# Patient Record
Sex: Female | Born: 1975 | Race: Black or African American | Hispanic: No | Marital: Single | Smoking: Current every day smoker
Health system: Southern US, Community
[De-identification: ages and names within clinical notes are randomized; demographics above are authoritative.]

## PROBLEM LIST (undated history)

## (undated) DIAGNOSIS — M1220 Villonodular synovitis (pigmented), unspecified site: Secondary | ICD-10-CM

## (undated) DIAGNOSIS — C801 Malignant (primary) neoplasm, unspecified: Secondary | ICD-10-CM

## (undated) DIAGNOSIS — D869 Sarcoidosis, unspecified: Secondary | ICD-10-CM

## (undated) DIAGNOSIS — I10 Essential (primary) hypertension: Secondary | ICD-10-CM

## (undated) DIAGNOSIS — D693 Immune thrombocytopenic purpura: Secondary | ICD-10-CM

## (undated) DIAGNOSIS — K5090 Crohn's disease, unspecified, without complications: Secondary | ICD-10-CM

## (undated) DIAGNOSIS — R233 Spontaneous ecchymoses: Secondary | ICD-10-CM

## (undated) DIAGNOSIS — D649 Anemia, unspecified: Secondary | ICD-10-CM

## (undated) DIAGNOSIS — R238 Other skin changes: Secondary | ICD-10-CM

## (undated) DIAGNOSIS — R634 Abnormal weight loss: Secondary | ICD-10-CM

## (undated) DIAGNOSIS — Z413 Encounter for ear piercing: Secondary | ICD-10-CM

## (undated) DIAGNOSIS — M539 Dorsopathy, unspecified: Secondary | ICD-10-CM

## (undated) DIAGNOSIS — F329 Major depressive disorder, single episode, unspecified: Secondary | ICD-10-CM

## (undated) DIAGNOSIS — R0602 Shortness of breath: Secondary | ICD-10-CM

## (undated) DIAGNOSIS — E639 Nutritional deficiency, unspecified: Secondary | ICD-10-CM

## (undated) DIAGNOSIS — I639 Cerebral infarction, unspecified: Secondary | ICD-10-CM

## (undated) DIAGNOSIS — I8290 Acute embolism and thrombosis of unspecified vein: Secondary | ICD-10-CM

## (undated) DIAGNOSIS — R002 Palpitations: Secondary | ICD-10-CM

## (undated) DIAGNOSIS — Z973 Presence of spectacles and contact lenses: Secondary | ICD-10-CM

## (undated) DIAGNOSIS — L259 Unspecified contact dermatitis, unspecified cause: Secondary | ICD-10-CM

## (undated) DIAGNOSIS — N289 Disorder of kidney and ureter, unspecified: Secondary | ICD-10-CM

## (undated) DIAGNOSIS — F32A Depression, unspecified: Secondary | ICD-10-CM

## (undated) DIAGNOSIS — R059 Cough, unspecified: Secondary | ICD-10-CM

## (undated) DIAGNOSIS — R6889 Other general symptoms and signs: Secondary | ICD-10-CM

## (undated) DIAGNOSIS — L818 Other specified disorders of pigmentation: Secondary | ICD-10-CM

## (undated) DIAGNOSIS — R011 Cardiac murmur, unspecified: Secondary | ICD-10-CM

## (undated) DIAGNOSIS — G629 Polyneuropathy, unspecified: Secondary | ICD-10-CM

## (undated) DIAGNOSIS — G43909 Migraine, unspecified, not intractable, without status migrainosus: Secondary | ICD-10-CM

## (undated) DIAGNOSIS — J45909 Unspecified asthma, uncomplicated: Secondary | ICD-10-CM

## (undated) DIAGNOSIS — R05 Cough: Secondary | ICD-10-CM

## (undated) DIAGNOSIS — K219 Gastro-esophageal reflux disease without esophagitis: Secondary | ICD-10-CM

## (undated) DIAGNOSIS — Z9289 Personal history of other medical treatment: Secondary | ICD-10-CM

## (undated) DIAGNOSIS — I499 Cardiac arrhythmia, unspecified: Secondary | ICD-10-CM

## (undated) HISTORY — DX: Villonodular synovitis (pigmented), unspecified site: M12.20

## (undated) HISTORY — PX: OOPHORECTOMY: SHX86

## (undated) HISTORY — PX: OTHER SURGICAL HISTORY: SHX169

## (undated) HISTORY — PX: HX SMALL BOWEL RESECTION: SHX150

## (undated) HISTORY — PX: HX SUBCLAVIAN PORT IMPLANTION: 2100001166

## (undated) HISTORY — PX: HX COLONOSCOPY: 2100001147

## (undated) HISTORY — PX: HX FACIAL RECONSTRUCTION: 2100001155

## (undated) HISTORY — PX: HX OVARIAN CYST REMOVAL: SHX89

---
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
## 1997-09-17 ENCOUNTER — Encounter: Admission: RE | Admit: 1997-09-17 | Discharge: 1997-09-17 | Payer: Self-pay | Admitting: Internal Medicine

## 1997-09-18 ENCOUNTER — Encounter: Admission: RE | Admit: 1997-09-18 | Discharge: 1997-09-18 | Payer: Self-pay | Admitting: Internal Medicine

## 1997-11-05 ENCOUNTER — Emergency Department (HOSPITAL_COMMUNITY): Admission: EM | Admit: 1997-11-05 | Discharge: 1997-11-05 | Payer: Self-pay | Admitting: Emergency Medicine

## 1997-12-02 ENCOUNTER — Encounter: Admission: RE | Admit: 1997-12-02 | Discharge: 1997-12-02 | Payer: Self-pay | Admitting: Internal Medicine

## 1999-06-15 DIAGNOSIS — O2230 Deep phlebothrombosis in pregnancy, unspecified trimester: Secondary | ICD-10-CM

## 1999-06-15 DIAGNOSIS — I2699 Other pulmonary embolism without acute cor pulmonale: Secondary | ICD-10-CM

## 1999-06-15 HISTORY — DX: Deep phlebothrombosis in pregnancy, unspecified trimester: O22.30

## 1999-06-15 HISTORY — DX: Other pulmonary embolism without acute cor pulmonale (CMS HCC): I26.99

## 2000-08-11 ENCOUNTER — Emergency Department (HOSPITAL_COMMUNITY): Admission: EM | Admit: 2000-08-11 | Discharge: 2000-08-11 | Payer: Self-pay | Admitting: Emergency Medicine

## 2000-10-20 ENCOUNTER — Inpatient Hospital Stay (HOSPITAL_COMMUNITY): Admission: AD | Admit: 2000-10-20 | Discharge: 2000-10-20 | Payer: Self-pay | Admitting: *Deleted

## 2000-12-14 ENCOUNTER — Emergency Department (HOSPITAL_COMMUNITY): Admission: EM | Admit: 2000-12-14 | Discharge: 2000-12-14 | Payer: Self-pay | Admitting: *Deleted

## 2001-10-05 ENCOUNTER — Inpatient Hospital Stay (HOSPITAL_COMMUNITY): Admission: AD | Admit: 2001-10-05 | Discharge: 2001-10-05 | Payer: Self-pay | Admitting: *Deleted

## 2001-10-06 ENCOUNTER — Inpatient Hospital Stay (HOSPITAL_COMMUNITY): Admission: AD | Admit: 2001-10-06 | Discharge: 2001-10-06 | Payer: Self-pay | Admitting: *Deleted

## 2001-10-06 ENCOUNTER — Encounter: Payer: Self-pay | Admitting: *Deleted

## 2001-10-12 ENCOUNTER — Encounter: Admission: RE | Admit: 2001-10-12 | Discharge: 2001-10-12 | Payer: Self-pay | Admitting: Obstetrics and Gynecology

## 2001-11-15 ENCOUNTER — Other Ambulatory Visit (HOSPITAL_COMMUNITY): Payer: Self-pay

## 2002-09-04 ENCOUNTER — Inpatient Hospital Stay (HOSPITAL_COMMUNITY): Admission: AD | Admit: 2002-09-04 | Discharge: 2002-09-04 | Payer: Self-pay | Admitting: *Deleted

## 2002-09-04 ENCOUNTER — Encounter: Payer: Self-pay | Admitting: *Deleted

## 2012-06-14 HISTORY — PX: GASTRECTOMY: SHX58

## 2012-07-09 ENCOUNTER — Inpatient Hospital Stay
Admission: EM | Admit: 2012-07-09 | Discharge: 2012-08-01 | DRG: 326 | Disposition: A | Discharge status: Home / Self Care | Payer: MEDICAID | Source: Other Acute Inpatient Hospital | Attending: SURGICAL ONCOLOGY | Admitting: SURGICAL ONCOLOGY

## 2012-07-09 ENCOUNTER — Inpatient Hospital Stay (HOSPITAL_COMMUNITY): Payer: MEDICAID | Admitting: Surgical Oncology

## 2012-07-09 DIAGNOSIS — G894 Chronic pain syndrome: Secondary | ICD-10-CM | POA: Diagnosis present

## 2012-07-09 HISTORY — DX: Palpitations: R00.2

## 2012-07-09 HISTORY — DX: Nutritional deficiency, unspecified: E63.9

## 2012-07-09 HISTORY — DX: Cough, unspecified: R05.9

## 2012-07-09 HISTORY — DX: Shortness of breath: R06.02

## 2012-07-09 HISTORY — DX: Anemia, unspecified: D64.9

## 2012-07-09 HISTORY — DX: Cerebral infarction, unspecified (CMS HCC): I63.9

## 2012-07-09 HISTORY — DX: Abnormal weight loss: R63.4

## 2012-07-09 LAB — BASIC METABOLIC PANEL
ANION GAP: 6 mmol/L (ref 5–16)
BUN/CREAT RATIO: 10 (ref 6–22)
BUN: 8 mg/dL (ref 6–20)
CALCIUM: 9.3 mg/dL (ref 8.5–10.4)
CARBON DIOXIDE: 27 mmol/L (ref 22–32)
CHLORIDE: 100 mmol/L (ref 96–111)
CREATININE: 0.81 mg/dL (ref 0.49–1.10)
ESTIMATED GLOMERULAR FILTRATION RATE: >59 ml/min/1.73m2 (ref >59)
GLUCOSE,NONFAST: 87 mg/dL (ref 65–139)
POTASSIUM: 4.4 mmol/L (ref 3.5–5.1)
SODIUM: 133 mmol/L — ABNORMAL LOW (ref 136–145)

## 2012-07-09 LAB — CBC/DIFF
BASOPHILS: 0 %
BASOS ABS: 0 10*3/uL (ref 0.0–0.2)
EOS ABS: 0.648 10*3/uL — ABNORMAL HIGH (ref 0.0–0.5)
EOSINOPHIL: 6 %
HCT: 30.4 % — ABNORMAL LOW (ref 33.5–45.2)
HGB: 9.6 g/dL — ABNORMAL LOW (ref 11.2–15.2)
LYMPHOCYTES: 11 %
LYMPHS ABS: 1.188 THOU/uL (ref 1.0–4.8)
MCH: 27.3 pg — ABNORMAL LOW (ref 27.4–33.0)
MCHC: 31.7 g/dL — ABNORMAL LOW (ref 32.5–35.8)
MCV: 86.2 fL (ref 78–100)
MONOCYTES: 13 %
MONOS ABS: 1.404 10*3/uL — ABNORMAL HIGH (ref 0.3–1.0)
PLATELET COUNT: UNDETERMINED THOU/uL (ref 140–450)
PMN ABS: 7.56 10*3/uL (ref 1.5–7.7)
PMN'S: 70 %
RBC: 3.53 MIL/uL — ABNORMAL LOW (ref 3.63–4.92)
RDW: 15.7 % — ABNORMAL HIGH (ref 12.0–15.0)
WBC: 9.3 10*3/uL (ref 3.5–11.0)

## 2012-07-09 LAB — HEPATIC FUNCTION PANEL
ALBUMIN: 2.9 g/dL — ABNORMAL LOW (ref 3.5–4.8)
ALKALINE PHOSPHATASE: 75 U/L (ref 38–126)
ALT (SGPT): 6 U/L — ABNORMAL LOW (ref 7–45)
AST (SGOT): 20 U/L (ref 8–41)
BILIRUBIN, TOTAL: 0.5 mg/dL (ref 0.3–1.3)
BILIRUBIN,CONJUGATED: <0.2 mg/dl (ref 0.0–0.3)
TOTAL PROTEIN: 5.7 g/dL — ABNORMAL LOW (ref 6.4–8.3)

## 2012-07-09 LAB — PHOSPHORUS: PHOSPHORUS: 5.4 mg/dL — ABNORMAL HIGH (ref 2.4–4.7)

## 2012-07-09 LAB — MAGNESIUM: MAGNESIUM: 1.7 mg/dL (ref 1.7–2.5)

## 2012-07-09 MED ORDER — ESOMEPRAZOLE SODIUM 40 MG INTRAVENOUS SOLUTION
40.0000 mg | Freq: Every day | INTRAVENOUS | Status: DC
Start: 2012-07-09 — End: 2012-07-25
  Administered 2012-07-09 – 2012-07-24 (×13): 40 mg via INTRAVENOUS
  Filled 2012-07-09 (×17): qty 5

## 2012-07-09 MED ORDER — DOCUSATE SODIUM 100 MG CAPSULE
100.0000 mg | ORAL_CAPSULE | Freq: Two times a day (BID) | ORAL | Status: DC
Start: 2012-07-09 — End: 2012-07-19
  Filled 2012-07-09 (×21): qty 1

## 2012-07-09 MED ORDER — LACTATED RINGERS INTRAVENOUS SOLUTION
INTRAVENOUS | Status: DC
Start: 2012-07-09 — End: 2012-07-18

## 2012-07-09 MED ORDER — ONDANSETRON HCL (PF) 4 MG/2 ML INJECTION SOLUTION
4.00 mg | Freq: Three times a day (TID) | INTRAMUSCULAR | Status: DC | PRN
Start: 2012-07-09 — End: 2012-07-17
  Administered 2012-07-09 – 2012-07-17 (×19): 4 mg via INTRAVENOUS
  Filled 2012-07-09 (×20): qty 2

## 2012-07-09 MED ORDER — DIPHENHYDRAMINE 25 MG CAPSULE
50.0000 mg | ORAL_CAPSULE | ORAL | Status: AC
Start: 2012-07-09 — End: 2012-07-09
  Administered 2012-07-09: 50 mg via ORAL
  Filled 2012-07-09: qty 2

## 2012-07-09 MED ORDER — LORAZEPAM 1 MG TABLET
1.0000 mg | ORAL_TABLET | ORAL | Status: DC | PRN
Start: 2012-07-09 — End: 2012-07-12
  Administered 2012-07-09 – 2012-07-12 (×6): 1 mg via ORAL
  Filled 2012-07-09 (×6): qty 1

## 2012-07-09 MED ORDER — PROMETHAZINE 25 MG/ML INJECTION SOLUTION
25.0000 mg | Freq: Four times a day (QID) | INTRAMUSCULAR | Status: DC | PRN
Start: 2012-07-09 — End: 2012-07-25
  Administered 2012-07-09 – 2012-07-22 (×10): 25 mg via INTRAMUSCULAR
  Filled 2012-07-09 (×28): qty 1

## 2012-07-09 MED ORDER — HYDROMORPHONE 1 MG/ML INJECTION WRAPPER
0.2000 mg | INJECTION | INTRAMUSCULAR | Status: DC | PRN
Start: 2012-07-09 — End: 2012-07-10
  Filled 2012-07-09 (×4): qty 1

## 2012-07-09 MED ADMIN — docusate sodium 100 mg capsule: 0 mg | ORAL

## 2012-07-09 MED ADMIN — HYDROmorphone (PF) 1 mg/mL injection solution: 0.2 mg | INTRAVENOUS | NDC 00409255201

## 2012-07-09 NOTE — Nurses Notes (Signed)
 Patient admitted as a transfer from [facility] via ambulance with gastric mass. Dr. Bonasso with Surg Onc paged and currently at bedside seeing patient. Awaiting placement of orders at this time. Will monitor.

## 2012-07-09 NOTE — Nurses Notes (Signed)
 Lab called to inform RN that patient's platelets had clumped, therefore platelet count could not be resulted unless new CBC drawn. Paged Dr. Bonasso. Stated not to redraw CBC at this time, would check with CBC ordered for tomorrow. Also spoke with Dr. Bonasso regarding patient's NPO status. Patient states she is very hungry and was eating this morning at outside facility. Dr. Bonasso stated they would be at bedside soon to evaluate patient and to keep patient NPO until further notice. No further orders obtained. Will monitor.

## 2012-07-09 NOTE — H&P (Addendum)
[facility]  Surgery  Admission H&P    Bly, 37 y.o. female  Date of Birth:  [birth year]  Date of Admission:  07/09/2012    Information Obtained from: patient  Chief Complaint: abdominal pain.     PCP: No primary provider on file.    Michele Mason is a 37 y.o., Black/African American female with past medical history of chronic pain, sarcoidosis, and question of Crohn's disease who presented to [facility] on 07/04/12.  Patient was previous seen by Dr. Concha Se in [place] when she underwent a colonoscopy and EGD.  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  Patient states that over the past two months she has had a sharp stabbing pain in her abdomen each time she eats.  The pain has been very unbearable causing her to have a twenty pound weight loss over that time period.  She also complains of nausea and vomiting .  At outside facility, CT scan was performed without evidence of stomach mass. Patient was transferred to [facility] for further care under Dr. Theodoro Grist.     For symptom treatment of pain and nausea, patient has been taken zantac and Nexium.  She states that she had two small bowel obstructions in 2007 and 2008 requiring surgeries and was diagnosed with Crohn's disease since that time.  She has not taken any medications for Crohn's treatment. 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     ROS:  MUST comment on all "Abnormal" findings   ROS Other than ROS in the HPI, all other systems were negative.    PAST MEDICAL/ FAMILY/ SOCIAL HISTORY:     No past medical history on file.  Allergies not on file  Medications Prior to Admission    None         No past surgical history on file.  No family history on file.  History    Substance Use Topics   . Smoking status: Not on file   . Smokeless tobacco: Not on file   . Alcohol Use: Not on file     PHYSICAL EXAMINATION: MUST comment on all "Abnormal" findings    Exam    General: appears in good health  Eyes: Conjunctiva clear.  HENT:Head atraumatic and normocephalic  Neck: No JVD or thyromegaly or lymphadenopathy  Lungs: Clear to auscultation bilaterally.   Cardiovascular: regular rate and rhythm  Abdomen: midline abdominal scar c/d/i. tenderness to palpation in mid epigastrum.  Extremities: No cyanosis or edema  Skin: Skin warm and dry  Neurologic: Grossly normal    Labs Ordered/ Reviewed (Please indicate ordered or reviewed)   Reviewed: Labs:  Lab Results for Last 24 Hours:    Results for orders placed during the hospital encounter of 07/09/12 (from the past 24 hour(s))   CBC/DIFF       Result Value Range    WBC 9.3  3.5 - 11.0 THOU/uL    RBC 3.53 (*) 3.63 - 4.92 MIL/uL    HGB 9.6 (*) 11.2 - 15.2 g/dL    HCT 16.1 (*) 09.6 - 45.2 %    MCV 86.2  78 - 100 fL    MCH 27.3 (*) 27.4 - 33.0 pg    MCHC 31.7 (*) 32.5 - 35.8 g/dL  RDW 15.7 (*) 12.0 - 15.0 %    PLATELET COUNT PLATELETS CLUMPED ON SLIDE, UNABLE TO DO COUNT  140 - 450 THOU/uL    MPV NOT REPORTED  7.5 - 11.5 fL    PMN'S 70      PMN ABS 7.560  1.5 - 7.7 THOU/uL    LYMPHOCYTES 11      LYMPHS ABS 1.188  1.0 - 4.8 THOU/uL    MONOCYTES 13      MONOS ABS 1.404 (*) 0.3 - 1.0 THOU/uL    EOSINOPHIL 6      EOS ABS 0.648 (*) 0.0 - 0.5 THOU/uL    BASOPHILS 0      BASOS ABS 0.000  0.0 - 0.2 THOU/uL    HYPOCHROMASIA MODERATE      ANISOCYTOSIS SLIGHT      MICROCYTOSIS SLIGHT      PLATELET CLUMPS PRESENT     BASIC METABOLIC PANEL, NON-FASTING       Result Value Range    SODIUM 133 (*) 136 - 145 mmol/L    POTASSIUM 4.4  3.5 - 5.1 mmol/L    CHLORIDE 100  96 - 111 mmol/L    CARBON DIOXIDE 27  22 - 32 mmol/L    ANION GAP 6  5 - 16 mmol/L    CREATININE 0.81  0.49 - 1.10 mg/dL    ESTIMATED GLOMERULAR FILTRATION RATE >59  >59 ml/min/1.31m2     GLUCOSE,NONFAST 87  65 - 139 mg/dL    BUN 8  6 - 20 mg/dL    BUN/CREAT RATIO 10  6 - 22    CALCIUM 9.3  8.5 - 10.4 mg/dL   MAGNESIUM       Result Value Range    MAGNESIUM 1.7  1.7 - 2.5 mg/dL   PHOSPHORUS       Result Value Range    PHOSPHORUS 5.4 (*) 2.4 - 4.7 mg/dL   HEPATIC FUNCTION PANEL       Result Value Range    ALBUMIN 2.9 (*) 3.5 - 4.8 g/dL    BILIRUBIN, TOTAL 0.5  0.3 - 1.3 mg/dL    ALKALINE PHOSPHATASE 75  38 - 126 U/L    AST (SGOT) 20  8 - 41 U/L    ALT (SGPT) 6 (*) 7 - 45 U/L    BILIRUBIN,CONJUGATED <0.2  0.0 - 0.3 mg/dl    TOTAL PROTEIN 5.7 (*) 6.4 - 8.3 g/dL     Radiology Tests Ordered/ Reviewed (Please indicate ordered or reviewed)   Reviewed:  CT abdomen and pelvis:   No evidence of an abdominal or pelvic mass or abscess is identified. No evidence of a small bowel obstruction is seen. A 5 mm in diameter nodule seen in the right lower lobe.     CT scan reviewed with concern with circumferential mass around pylorus and antrum of stomach.     ASSESSMENT & PLAN:    37 year old female with antral mass reportedly adenocarcinoma per outside report.     Will consult GI for EGD biopsy and EUS.   -spoke with GI resident who stated he will not see patient until tomorrow with Dr. Boyd Kerbs  Full liquid diet.  Nutritional supplements.    Dierdre Forth II, MD, 07/09/2012 2:19 PM    A late entry for note on 07/09/2012.      I saw and examined the patient.  I reviewed the resident's note.  I agree with the findings and plan of care as documented  in the resident's note.  Any exceptions/additions are edited/noted.    Lester Carolina, MD 07/10/2012, 1:09 PM

## 2012-07-09 NOTE — Nurses Notes (Signed)
Pt stated Dr. Lance Bosch would place an order for something to help her sleep tonight. No orders placed. Paged Lance Bosch. He said he would place an order for benadryl 50 mg one time dose. Will continue to monitor.

## 2012-07-09 NOTE — Nurses Notes (Signed)
Pt and pt's father asking questions regarding admission and dx. Paged Dr. Lance Bosch and asked if he could come and talk to the pt. He said he would visit.

## 2012-07-10 ENCOUNTER — Encounter (HOSPITAL_COMMUNITY): Payer: Self-pay | Admitting: SURGICAL ONCOLOGY

## 2012-07-10 DIAGNOSIS — E440 Moderate protein-calorie malnutrition: Secondary | ICD-10-CM

## 2012-07-10 DIAGNOSIS — G894 Chronic pain syndrome: Secondary | ICD-10-CM

## 2012-07-10 DIAGNOSIS — D869 Sarcoidosis, unspecified: Secondary | ICD-10-CM

## 2012-07-10 DIAGNOSIS — C169 Malignant neoplasm of stomach, unspecified: Secondary | ICD-10-CM

## 2012-07-10 HISTORY — DX: Sarcoidosis, unspecified: D86.9

## 2012-07-10 HISTORY — DX: Moderate protein-calorie malnutrition (CMS HCC): E44.0

## 2012-07-10 HISTORY — DX: Chronic pain syndrome: G89.4

## 2012-07-10 HISTORY — DX: Malignant neoplasm of stomach, unspecified (CMS HCC): C16.9

## 2012-07-10 LAB — CBC
HCT: 30.7 % — ABNORMAL LOW (ref 33.5–45.2)
HGB: 9.9 g/dL — ABNORMAL LOW (ref 11.2–15.2)
MCH: 27.3 pg — ABNORMAL LOW (ref 27.4–33.0)
MCHC: 32.1 g/dL — ABNORMAL LOW (ref 32.5–35.8)
MCV: 85 fL (ref 78–100)
MPV: 9 fL (ref 7.5–11.5)
PLATELET COUNT: 167 THOU/uL (ref 140–450)
RBC: 3.61 MIL/uL — ABNORMAL LOW (ref 3.63–4.92)
RDW: 15.8 % — ABNORMAL HIGH (ref 12.0–15.0)
WBC: 8.4 10*3/uL (ref 3.5–11.0)

## 2012-07-10 LAB — BASIC METABOLIC PANEL
ANION GAP: 6 mmol/L (ref 5–16)
BUN/CREAT RATIO: 8 (ref 6–22)
BUN: 7 mg/dL (ref 6–20)
CALCIUM: 9.3 mg/dL (ref 8.5–10.4)
CARBON DIOXIDE: 27 mmol/L (ref 22–32)
CHLORIDE: 103 mmol/L (ref 96–111)
CREATININE: 0.84 mg/dL (ref 0.49–1.10)
ESTIMATED GLOMERULAR FILTRATION RATE: >59 ml/min/1.73m2 (ref >59)
GLUCOSE,NONFAST: 94 mg/dL (ref 65–139)
POTASSIUM: 4.9 mmol/L (ref 3.5–5.1)
SODIUM: 136 mmol/L (ref 136–145)

## 2012-07-10 LAB — CARCINOEMBRYONIC ANTIGEN: CARCINOEMBRYONIC AG: 1.4 ng/mL (ref <3.1)

## 2012-07-10 LAB — PT/INR
INR: 1 (ref 0.8–1.2)
PROTHROMBIN TIME: 10.2 s (ref 9.1–12.5)

## 2012-07-10 LAB — PREALBUMIN: PREALBUMIN: 11.3 mg/dL — ABNORMAL LOW (ref 18–40)

## 2012-07-10 MED ORDER — HYDROCODONE 7.5 MG-ACETAMINOPHEN 325 MG/15 ML ORAL SOLUTION
10.0000 mL | ORAL | Status: DC | PRN
Start: 2012-07-10 — End: 2012-07-13
  Administered 2012-07-10 – 2012-07-13 (×8): 10 mL via ORAL
  Filled 2012-07-10 (×8): qty 15

## 2012-07-10 MED ORDER — HYDROMORPHONE 1 MG/ML INJECTION WRAPPER
0.6000 mg | INJECTION | Freq: Once | INTRAMUSCULAR | Status: AC
Start: 2012-07-10 — End: 2012-07-10
  Administered 2012-07-10: 0.6 mg via INTRAVENOUS
  Filled 2012-07-10: qty 1

## 2012-07-10 MED ORDER — CALCIUM GLUCONATE 100 MG/ML (10 %) INTRAVENOUS SOLUTION
INTRAVENOUS | Status: AC
Start: 2012-07-10 — End: 2012-07-11
  Filled 2012-07-10: qty 600

## 2012-07-10 MED ADMIN — docusate sodium 100 mg capsule: 100 mg | ORAL | NDC 00904788980

## 2012-07-10 MED ADMIN — HYDROmorphone (PF) 1 mg/mL injection solution: 0.2 mg | INTRAVENOUS | NDC 00409255201

## 2012-07-10 MED ADMIN — HYDROmorphone (PF) 1 mg/mL injection solution: 0.4 mg | INTRAVENOUS | NDC 00409255201

## 2012-07-10 MED ADMIN — HYDROmorphone (PF) 1 mg/mL injection solution: 0 mg | INTRAVENOUS

## 2012-07-10 MED FILL — HYDROmorphone 1 mg/mL injection syringe: 0.4000 mg | INTRAMUSCULAR | Qty: 1 | Status: AC

## 2012-07-10 NOTE — Care Plan (Signed)
 Problem: General Plan of Care(Adult,OB)  Goal: Plan of Care Review(Adult,OB)  The patient and/or their representative will communicate an understanding of their plan of care   Outcome: Ongoing (see interventions/notes)  Discharge Plan:  Home(Patient/Family Member/other) (code 1)  Patient admitted for Gastric Mass.  Denies any wants or needs at this time.  Patient is comfortable and safe upon discharge.  Will cont to await disposition.    The patient will continue to be evaluated for developing discharge needs.

## 2012-07-10 NOTE — Care Management Notes (Signed)
[facility] Management Initial Evaluation    Patient Name: Michele Mason  Date of Birth: 1976-05-22  Sex: female  Date/Time of Admission: 07/09/2012  1:37 PM  Room/Bed: 781/A  Payor: [place] MEDICAID  Plan: [facility] MEDICAID  Product Type: Medicaid    PCP: No primary provider on file.    Pharmacy Info:   Preferred Pharmacy    None        Emergency Contact Info:   Extended Emergency Contact Information  Primary Emergency Contact: HARUMI YAMIN   [place]  Home Phone: [phone]  Relation: None    History:   Michele Mason is a 37 y.o., female, admitted gastric mass    Height/Weight: 154.9 cm (5\' 1" ) / 52.8 kg (116 lb 6.5 oz)    Subjective/Objective: Discharge Planning     LOS: 1 day   Admitting Diagnosis: gastric mass  Assessment:    07/10/12 1441   Assessment Details   Assessment Type Admission   Date of Care Management Update 07/10/12   Date of Next DCP Update 07/13/12   Care Management Plan   Discharge Planning Status initial meeting   Projected Discharge Date 07/10/12   Discharge Needs Assessment   Outpatient/Agency/Support Group Needs none   Equipment Currently Used At Home none   Discharge Facility/Level of Care Needs Home (Patient/Family Member/other)(code 1)   Transportation Available car;family or friend will provide   Referral Information   Admission Type inpatient   Address Verified verified-no changes   Arrived From home   Insurance Verified verified-no change   ADVANCE DIRECTIVES   !! Does the Patient have an Advance Directive? No, Information Offered and Refused   Employment/Financial   Patient has Prescription Coverage?  Yes       Name of Insurance Coverage for Medications [place]   Living Environment   Lives With alone   Living Arrangements apartment   Able to Return to Prior Living Arrangements yes   Home Safety   Home Assessment: Stairs in Home   Home Accessibility stairs to enter home   Living Environment   Number of Stairs to Enter Home 15        Discharge Plan:  Home(Patient/Family Member/other) (code 1)  Patient admitted for Gastric Mass.  Denies any wants or needs at this time.  Patient is comfortable and safe upon discharge.  Will cont to await disposition.    The patient will continue to be evaluated for developing discharge needs.     Case Manager: Monico Hoar, RN 07/10/2012, 2:43 PM  Phone: [phone]

## 2012-07-10 NOTE — Nurses Notes (Signed)
Patient complaining of Nausea, Zofran last administered by nurse at 0054, Patient refusing to take Phenergan and requesting Zofran. Zofran ordered Q8h and patient is not due at this time. Dr. Ezequiel Essex notified, telephone orders received that, "she can have a dose of Zofran now". Zofran administered to patient, will continue to monitor.

## 2012-07-11 ENCOUNTER — Encounter (HOSPITAL_COMMUNITY): Payer: MEDICAID | Admitting: Anesthesiology-BA

## 2012-07-11 ENCOUNTER — Encounter (HOSPITAL_COMMUNITY)
Admission: EM | Disposition: A | Discharge status: Home / Self Care | Payer: Self-pay | Source: Other Acute Inpatient Hospital | Attending: SURGICAL ONCOLOGY

## 2012-07-11 ENCOUNTER — Encounter (HOSPITAL_COMMUNITY): Payer: Self-pay | Admitting: Anesthesiology-BA

## 2012-07-11 ENCOUNTER — Encounter (HOSPITAL_COMMUNITY): Payer: Self-pay

## 2012-07-11 ENCOUNTER — Other Ambulatory Visit (HOSPITAL_COMMUNITY): Payer: Self-pay | Admitting: Gastroenterology-BA

## 2012-07-11 HISTORY — PX: ENDOSCOPIC ULTRASOUND UPPER: WVUENDOPRO37

## 2012-07-11 HISTORY — PX: GASTROSCOPY WITH BIOPSY: WVUENDOPRO54

## 2012-07-11 LAB — PHOSPHORUS: PHOSPHORUS: 4.8 mg/dL — ABNORMAL HIGH (ref 2.4–4.7)

## 2012-07-11 LAB — MAGNESIUM: MAGNESIUM: 1.7 mg/dL (ref 1.7–2.5)

## 2012-07-11 SURGERY — GASTROSCOPY
Anesthesia: General | Site: Mouth | Wound class: Clean Contaminated Wounds-The respiratory, GI, Genital, or urinary

## 2012-07-11 MED ORDER — MIDAZOLAM 1 MG/ML INJECTION SOLUTION
Freq: Once | INTRAMUSCULAR | Status: DC | PRN
Start: 2012-07-11 — End: 2012-07-11
  Administered 2012-07-11: 2 mg via INTRAVENOUS

## 2012-07-11 MED ORDER — GLYCOPYRROLATE 0.2 MG/ML INJECTION SOLUTION
Freq: Once | INTRAMUSCULAR | Status: DC | PRN
Start: 2012-07-11 — End: 2012-07-11
  Administered 2012-07-11: 0.4 mg via INTRAVENOUS

## 2012-07-11 MED ORDER — LACTATED RINGERS INTRAVENOUS SOLUTION
INTRAVENOUS | Status: DC
Start: 2012-07-11 — End: 2012-07-19

## 2012-07-11 MED ORDER — ACETAMINOPHEN 325 MG TABLET
650.0000 mg | ORAL_TABLET | ORAL | Status: DC | PRN
Start: 2012-07-11 — End: 2012-07-19
  Administered 2012-07-11 – 2012-07-18 (×12): 650 mg via ORAL
  Filled 2012-07-11 (×13): qty 2

## 2012-07-11 MED ORDER — HEPARIN LOCK FLUSH (PORCINE) 10 UNIT/ML INTRAVENOUS SOLUTION
2.0000 mL | INTRAVENOUS | Status: DC | PRN
Start: 2012-07-11 — End: 2012-07-27

## 2012-07-11 MED ORDER — PHENYLEPHRINE 120 MCG/ML IV DILUTION
Freq: Once | INTRAMUSCULAR | Status: DC | PRN
Start: 2012-07-11 — End: 2012-07-11
  Administered 2012-07-11: 120 ug via INTRAVENOUS

## 2012-07-11 MED ORDER — ONDANSETRON HCL (PF) 4 MG/2 ML INJECTION SOLUTION
Freq: Once | INTRAMUSCULAR | Status: DC | PRN
Start: 2012-07-11 — End: 2012-07-11
  Administered 2012-07-11: 4 mg via INTRAVENOUS

## 2012-07-11 MED ORDER — DEXAMETHASONE SODIUM PHOSPHATE 4 MG/ML INJECTION SOLUTION
Freq: Once | INTRAMUSCULAR | Status: DC | PRN
Start: 2012-07-11 — End: 2012-07-11
  Administered 2012-07-11: 4 mg via INTRAVENOUS

## 2012-07-11 MED ORDER — SODIUM CHLORIDE 0.9 % (FLUSH) INJECTION SYRINGE
20.0000 mL | INJECTION | INTRAMUSCULAR | Status: DC | PRN
Start: 2012-07-11 — End: 2012-07-27
  Filled 2012-07-11: qty 30

## 2012-07-11 MED ORDER — PROPOFOL 10 MG/ML IV BOLUS
INJECTION | Freq: Once | INTRAVENOUS | Status: DC | PRN
Start: 2012-07-11 — End: 2012-07-11
  Administered 2012-07-11: 100 mg via INTRAVENOUS
  Administered 2012-07-11: 10 mg via INTRAVENOUS

## 2012-07-11 MED ORDER — HYDROMORPHONE 1 MG/ML INJECTION WRAPPER
0.2000 mg | INJECTION | INTRAMUSCULAR | Status: DC | PRN
Start: 2012-07-11 — End: 2012-07-11
  Filled 2012-07-11: qty 1

## 2012-07-11 MED ORDER — FENTANYL (PF) 50 MCG/ML INJECTION SOLUTION
Freq: Once | INTRAMUSCULAR | Status: DC | PRN
Start: 2012-07-11 — End: 2012-07-11
  Administered 2012-07-11 (×2): 50 ug via INTRAVENOUS

## 2012-07-11 MED ORDER — SODIUM CHLORIDE 0.9 % (FLUSH) INJECTION SYRINGE
10.0000 mL | INJECTION | Freq: Three times a day (TID) | INTRAMUSCULAR | Status: DC
Start: 2012-07-11 — End: 2012-07-27

## 2012-07-11 MED ORDER — BENZOCAINE 7.5 % MUCOSAL GEL
Freq: Every day | Status: DC | PRN
Start: 2012-07-11 — End: 2012-07-25
  Filled 2012-07-11 (×2): qty 10

## 2012-07-11 MED ORDER — LIDOCAINE (PF) 10 MG/ML (1 %) INJECTION SOLUTION
0.5000 mL | Freq: Once | INTRAMUSCULAR | Status: AC
Start: 2012-07-11 — End: 2012-07-11

## 2012-07-11 MED ORDER — NEOSTIGMINE METHYLSULFATE 1 MG/ML INJECTION SOLUTION
Freq: Once | INTRAMUSCULAR | Status: DC | PRN
Start: 2012-07-11 — End: 2012-07-11
  Administered 2012-07-11: 3 mg via INTRAVENOUS

## 2012-07-11 MED ORDER — HYDROMORPHONE 50 MG/50 ML IN 0.9 % SODIUM CHLORIDE INJECTION
1.0000 | INTRAMUSCULAR | Status: DC | PRN
Start: 2012-07-11 — End: 2012-07-19
  Filled 2012-07-11: qty 1

## 2012-07-11 MED ORDER — METOPROLOL TARTRATE 5 MG/5 ML INTRAVENOUS SOLUTION
Freq: Once | INTRAVENOUS | Status: DC | PRN
Start: 2012-07-11 — End: 2012-07-11
  Administered 2012-07-11 (×2): 1 mg via INTRAVENOUS

## 2012-07-11 MED ORDER — HEPARIN LOCK FLUSH (PORCINE) 10 UNIT/ML INTRAVENOUS SOLUTION
2.0000 mL | Freq: Three times a day (TID) | INTRAVENOUS | Status: DC
Start: 2012-07-11 — End: 2012-07-27
  Filled 2012-07-11 (×4): qty 5

## 2012-07-11 MED ORDER — HYDROMORPHONE 1 MG/ML INJECTION WRAPPER
0.4000 mg | INJECTION | INTRAMUSCULAR | Status: DC | PRN
Start: 2012-07-11 — End: 2012-07-11
  Filled 2012-07-11: qty 1

## 2012-07-11 MED ORDER — LIDOCAINE (PF) 100 MG/5 ML (2 %) INTRAVENOUS SYRINGE
INJECTION | Freq: Once | INTRAVENOUS | Status: DC | PRN
Start: 2012-07-11 — End: 2012-07-11
  Administered 2012-07-11: 65 mg via INTRAVENOUS

## 2012-07-11 MED ADMIN — HYDROmorphone (PF) 1 mg/mL injection solution: 0.4 mg | INTRAVENOUS | NDC 00409255201

## 2012-07-11 MED ADMIN — HYDROmorphone (PF) 1 mg/mL injection solution: 0.6 mg | INTRAVENOUS | NDC 00409255201

## 2012-07-11 MED ADMIN — docusate sodium 100 mg capsule: 100 mg | ORAL | NDC 00904788980

## 2012-07-11 MED ADMIN — HYDROmorphone 50 mg/50 mL in 0.9 % sodium chloride injection: 1 | INTRAVENOUS | NDC 61553024296

## 2012-07-11 MED ADMIN — heparin lock flush (porcine) 10 unit/mL intravenous solution: 0

## 2012-07-11 MED ADMIN — sodium chloride 0.9 % (flush) injection syringe: 10 mL | NDC 08290306547

## 2012-07-11 MED ADMIN — heparin lock flush (porcine) 10 unit/mL intravenous solution: 5 mL | NDC 6425322235

## 2012-07-11 MED ADMIN — sodium chloride 0.9 % (flush) injection syringe: 20 mL | NDC 08290306547

## 2012-07-11 MED ADMIN — lidocaine (PF) 10 mg/mL (1 %) injection solution: 0.5 mL | INTRADERMAL | NDC 09999995798

## 2012-07-11 MED FILL — parenteral amino acid 10 % combination no.6 intravenous solution: INTRAVENOUS | Qty: 600 | Status: AC

## 2012-07-11 MED FILL — HYDROmorphone 1 mg/mL injection syringe: 0.6000 mg | INTRAMUSCULAR | Qty: 1 | Status: AC

## 2012-07-11 SURGICAL SUPPLY — 80 items
BASIN EME 16OZ 9X3.8X2IN GRAD_FLXB DISP DST ROSE POLYPROP (PATU)
BASIN EME 8.4X3.8X2IN GRAD DISP DST ROSE POLYPROP C500ML LF (PATU) IMPLANT
BLOCK BITE 20MM PE ADULT MOUTHPC STRAP RETENTION RIM LUM SCPSVR LF  LRG 27MM GRN NONST DISP (AIR) ×1 IMPLANT
BLOCK BITE 27FR INFANT BITEBLOCS PEDIABLOC LF  DISP (AIR) IMPLANT
BRUSH CYTO 230CM 2.4MM INFNT ROT HNDL (SURGICAL INSTRUMENTS) IMPLANT
BRUSH CYTOLOGY COLONSCOPE DISP_2.4MM 00711499 BX/5 (BIOMEDICAL)
CANNULA INJ 17GA NDLS SYRG BLUNT STRL LF  10ML BD INTRLNK PLASTIC BXTR INTLNK ABT LS MCGAW SAFELINE (IV TUBING & ACCESSORIES) IMPLANT
CANNULA INJ 20GA 17GA 2 DEV HUB CAP BLUNT STRL LF  RD GRN BD TWINPAK STL PLASTIC (IV TUBING & ACCESSORIES) IMPLANT
CANNULA NASAL 14FT ANGL FLXB LIP PLATE CRSH RS LUM TUBE NFLR TP ADULT ARLF UCIT STD CURVE LF  DISP (CANNULA) IMPLANT
CANNULA NASAL 7FT ANGL FLXB LIP PLATE CRSH RS LUM TUBE FLR TIP ADULT ARLF UCIT STD CURVE LF  DISP (CANNULA) IMPLANT
CATH ELHMST GLD PRB 10FR 300CM_BIPO RND DIST TIP STD CONN (DIAGNOSTIC)
CATH ELHMST GLD PROBE 10FR 300CM BIPOLAR RND DIST TIP STD CONN FIRM SHAFT HMGLD STRL DISP 3.7MM MN (DIAGNOSTIC) IMPLANT
CATH ELHMST GLD PROBE 7FR 300CM BIPOLAR RND DIST TIP STD CONN FIRM SHAFT HMGLD STRL DISP 2.8MM MN (DIAGNOSTIC) IMPLANT
CATH SUCT ARLF TRIFLO 18FR 2 3ANG EYE BVL TIP CONN CONTROL PORT STRL LF  DISP CLR (Suction) ×1 IMPLANT
CLIP HMST RADOPQ PRELD STRL DISP RSL 235CM 2.8MM 11MM OPN (SURGICAL INSTRUMENTS) IMPLANT
CONV USE ITEM 343591 - SOLIDIFY FLUID 1500ML DSPNSR L_Q TX SOLIDIFY SFTP LTS+ DISP (STER) ×2 IMPLANT
DEVICE INFNT STFR BRSTL CYTO 2.4MM 230CM STRL LF  DISP (BIOMEDICAL) IMPLANT
DEVICE SPEC RETR TLN 2.5MM 160CM 4 PRONG GRASPER INWRD HOOK SHEATH SS DISP (ENDOSCOPIC SUPPLIES) IMPLANT
DILATOR ENDOS CRE 180CM 5.5CM 6-7-8MM 7.5FR ESOPH PYL BIL BAL LOW PROF GW PEBAX STRL DISP 2.8MM 3.2 (BALLOON) IMPLANT
DILATOR ENDOS CRE 180CM 8CM 10-11-12MM 6FR ESOPH BAL LOW PROF FIX WRE PEBAX STRL LF  DISP 2.8MM (BALLOON) IMPLANT
DILATOR ENDOS CRE 180CM 8CM 15-16.5-18MM 6FR ESOPH BAL LOW PROF FIX WRE PEBAX STRL LF  DISP 2.8MM (BALLOON)
DILATOR ENDOS CRE 180CM 8CM 6-7-8MM 6FR ESOPH BAL LOW PROF FIX WRE PEBAX STRL LF  DISP 2.8MM (BALLOON) IMPLANT
DILATOR ENDOS CRE 180CM 8CM 6FR 12-13.5-15MM ESOPH FIX WRE BAL RND SHLDR PEBAX STRL LF  DISP (GI LAB SUPPLIES) IMPLANT
DILATOR ENDOS CRE 180CM 8CM 8-9-10MM 6FR ESOPH BAL LOW PROF FIX WRE PEBAX STRL LF  DISP 2.8MM (GI LAB SUPPLIES) IMPLANT
DILATOR ENDOS CRE 240CM 5.5CM 11-13.5-15MM 7.5FR ESOPH PYL BIL BAL LOW PROF GW PEBAX STRL LF  DISP (BALLOON) IMPLANT
DILATOR ENDOS CRE 240CM 5.5CM 15-16.5-18MM 7.5FR ESOPH PYL BIL BAL LOW PROF GW PEBAX STRL LF  DISP (BALLOON) IMPLANT
DISC USE 402689 - ROTH NET 2.5MM X 160CM STD_00711053 BX/5 (Dilators) IMPLANT
DISCONTINUED NO SUB - JELLY LUB DYNALUBE BCTRST WATER SOL NGRS PKT STRL 5GM LF (WOUND CARE SUPPLY) ×2 IMPLANT
DISCONTINUED USE ITEM 309153 - TRAP SPECI ARGYLE 40CC GRAD SC_REW ON CAP REM MALE CONN (Cautery Accessories) IMPLANT
DISCONTINUED USE ITEM 339015 - CONTAINR STRL 10% NEUT BF FRMLN POLYPROP GRAD LEAK RST ORNG PREFL SCREW CAP FSHR HLTHCR PRTCL GRN (CHEM) ×3 IMPLANT
DISCONTINUED USE ITEM 82101 - TUBING OXYGEN 50/CS 001302 (TUBE/TUBING & SUCTION SUPPLIES) IMPLANT
DONUT EXTREMITY CUSHIONING 31143137 (POSITIONING PRODUCTS) ×1 IMPLANT
DUPE USE ITEM 301092 - DILATOR ENDOS CRE 180CM 8CM 15-16.5-18MM 6FR ESOPH BAL LOW PROF FIX WRE PEBAX STRL LF  DISP 2.8MM (BALLOON) IMPLANT
ELECTRODE PATIENT RTN 9FT VLAB C30- LB RM PHSV ACRL FOAM CORD NONIRRITATE NONSENSITIZE ADH STRP (CAUTERY SUPPLIES) IMPLANT
ELECTRODE PATIENT RTN 9FT VLAB_REM C30- LB PLHSV ACRL FOAM (CAUTERY SUPPLIES)
FILTER PROBE SIDE FIRE 2.3MM 20132-217 INTEGRATED BX/10 (Dilators) IMPLANT
FORCEPS BIOPSY 160CM 1.8MM RJ 4 PED 2+ MM DISP GASTROSCOPIC (SURGICAL INSTRUMENTS) IMPLANT
FORCEPS BIOPSY HOT 240CM 2.2MM RJ 4 +2.8MM DISP (INSTRUMENTS)
FORCEPS BIOPSY HOT 240CM 2.2MM RJ 4 +2.8MM DISPO (SURGICAL INSTRUMENTS) IMPLANT
FORCEPS BIOPSY MICROMESH TTH STREAMLINE CATH 240CM 2.4MM RJ 4 SS LRG CPC STRL DISP ORNG 2.8MM WRK (GUIDING) ×1 IMPLANT
FORCEPS BIOPSY NEEDLE 240CM 2.2MM RJ 4 2.8MM STD CPC STRL DISP ORNG (SURGICAL INSTRUMENTS) ×1 IMPLANT
FORCEPS SPEC RETR 240CM 2.3MM 15MM TFLN SS 3 RING HNDL 3 PRONG MONOF GRSP FB STRL LF  DISP (ENDOSCOPIC SUPPLIES) IMPLANT
INK ENDOSCOPIC MARKER SPOT 5ML GIS44 STERILE 10EA/BX (MISCELLANEOUS PT CARE ITEMS) IMPLANT
LIGATOR 122MM 9.5-13MM 6SHTR SA 6 BAND TRGR CORD NONST DISP ENDOS ESOPH VARICES NATURAL RUB LTX (SUTURE STAPLING DEVICES) IMPLANT
LINEAR BAL O RING KIT OFA67 (OR) IMPLANT
LINER SUCT RD CRD MEDIVAC TW LOCK LID SHTOF VALVE CAN FILTER 1500CC LF  DISP (Suction) ×2 IMPLANT
LOOP ENDO DETACH 20MM MAJ340 (GU) IMPLANT
LOOP HLDR DTCH AUTOCLAV 30MM SURG NYL PLPCTM NONST LF  DISP (ENDOSCOPIC SUPPLIES) IMPLANT
NEEDLE ECHOTIP ULTRA 22GA 2MM ECHO322 2MM BALL TIP (NEEDLES & SYRINGE SUPPLIES) IMPLANT
NEEDLE ENDOSCOPIC US 19GA ECHO19 5.2FR-4.2FR SHEATH (Biopsy) IMPLANT
NEEDLE ENDOSCOPIC US 25GA ECHO-25 5.2FR SHEATH G31519 (Biopsy) IMPLANT
NEEDLE SCLRTX 25GA 2.3MM OPTC TIP SHTH STRL LF DISP YW (NEEDLES & SYRINGE SUPPLIES) IMPLANT
NEEDLE SCLRTX 25GA 2.5MM INJ LL SPRG LD HNDL SHEATH LF  CRLK 230CM 5MM SS TFLN (Other Miscellaneous) IMPLANT
NET SPEC RETR 160CM 3MM RTHNT MAXI SHEATH 8X4CM NONST LF  DISP (Dilators) IMPLANT
NET SPEC RETR 230CM 2.5MM RTHNT STD SHEATH 6X3CM NONST LF  DISP (Dilators) IMPLANT
NET SPEC RETR 230CM 2.5MM RTHN_T STD SHTH 6X3CM NONST LF (Dilators)
OVERTUBE ENDOS 25CM 19.5MM 8.6-10MM 16.7MM INSFL CAP GUARDUS STD TAPER ESPH NONST LF  DISP (AIR) IMPLANT
OVERTUBE ENDOS 50CM 19.5MM 8.6-10MM 16.7MM TAPER TIP INSFL CAP GUARDUS GASTRIC LF (ENDOSCOPIC SUPPLIES) IMPLANT
PAD ARMBRD BLU (POSITIONING PRODUCTS) ×1 IMPLANT
PROBE ESURG 220CM 2.3MM FIAPC FLXB STR FIRE STRL DISP (CAUTERY SUPPLIES) IMPLANT
PROBE ESURG 220CM 2.3MM FIAPC_FLXB STR FIRE ARGON PLAS COAG (CAUTERY SUPPLIES)
RADIAL BAL EG36 3OUR OEA51 (OR) IMPLANT
RETRIEVER ENDOS 160CM 1.8MM RTHNT MINI SM CATH SHEATH 4.5X2CM NONST (Dilators) IMPLANT
SET IV UNIV EXT DUAL Y W/SLIDE CLMP NEEDLELESS 2C6612 (IV TUBING & ACCESSORIES) IMPLANT
SET TUBING ERBEFLOW CAP 24 HR ENDOSCP PUMP STRL DISP ORDER 10EA (GENE) IMPLANT
SNARE SM OVAL 240CM 2.4MM SNS LOOP SHRTHRW FLXB ENDOS PLPCTM 13MM STRL LF  DISP (DIAGNOSTIC) IMPLANT
SOLIDIFY FLUID 1500ML DSPNSR L_Q TX SOLIDIFY SFTP LTS+ DISP (STER) ×2
SYRINGE 5ML LF  STRL ST GRAD MED POLYPROP DISP (NEEDLES & SYRINGE SUPPLIES) IMPLANT
SYRINGE INFLAT ALN II GA STRL DISP 60ML (NEEDLES & SYRINGE SUPPLIES) IMPLANT
SYRINGE LL 3ML LF  STRL GRAD N-PYRG DEHP-FR PVC FREE MED DISP CLR (NEEDLES & SYRINGE SUPPLIES) IMPLANT
SYRINGE LL 50ML LF  STRL GRAD N-PYRG DEHP-FR PVC FREE MED DISP CLR (NEEDLES & SYRINGE SUPPLIES) ×1 IMPLANT
TRAP MUCOUS SPEC 10FR MST4000 (ANETHESIA SUPPLIES) IMPLANT
TRAY GASTRIC LAV 36IN 24FR ARGYLE EDLICH MONOJECT MED PVC 4 EYE CLS END GRAD SYRG TRNSPR 140CC PED (TRAY) IMPLANT
TRAY GASTRIC LAV 36IN 34FR ARGYLE EDLICH MONOJECT LRG PVC 4 EYE CLS END GRAD SYRG TRNSPR 140CC NONST (TRAY) IMPLANT
TRAY GASTRIC LAV 36IN 34FR ARG_YLE EDLICH MONOJECT LRG PVC 4 (TRAY)
TUBING SUCT CLR 20FT 9/32IN MEDIVAC NCDTV M/M CONN STRL LF (Suction) ×2 IMPLANT
TUBING SUCT CLR 6FT 3/16IN MEDIVAC MXGR MALE TO MALE CONN NCDTV STRL LF (Suction) IMPLANT
TUBING SUCT CONN 20FT LONG_STRL N720A (Suction) ×2
TUBING SUCT CONN 3/16X72IN_STRL N56A (Suction)
WATER STRL 500ML PLASTIC PR BTL LF (SOLUTIONS) ×2 IMPLANT

## 2012-07-11 NOTE — Nurses Notes (Signed)
Txt paged Dr. Lance Bosch regarding pt stating she has "hole in her tooth and is causing a lot of pain". Pt requesting Orajel. Was ordered to place an order for Orajel q6 prn. Will continue to monitor.

## 2012-07-11 NOTE — Nurses Notes (Signed)
Patient upset and requesting a hamberger per discussed with MD.  Rip Harbour per service then order full liquid diet only.  Manager calmed patient prior to nurse assessing, calmer at this time.  Co of pain to abdomen and left arm pain with edema and numbness in the mornings when waking up.  Pollyann Glen, LPN 7/82/9562, 1:30 PM

## 2012-07-11 NOTE — Care Plan (Signed)
Problem: General Plan of Care(Adult,OB)  Goal: Plan of Care Review(Adult,OB)  The patient and/or their representative will communicate an understanding of their plan of care   Outcome: Ongoing (see interventions/notes)  Pts prn includes dilaudid and loratab. Pt prefers to receive zofran with dilaudid to reduce nausea. Pt tolerating a mechanical soft diet, however will be npo at midnight for an EGD on 1/28. MIVF continues. Will continue to monitor.

## 2012-07-11 NOTE — Ancillary Notes (Signed)
 Medical Nutrition Therapy Assessment        07/11/2012      Reason for Assessment: High Risk Notification:  Unintentional loss of 10 lbs or more in the past 2 mos and Physician Consult:  Determine appropriate TPN    SUBJECTIVE : Patient verified height, weight and UBW.  There are conflicting weights obtained from 116 lb and 134 lbs so I obtained a bed weight and it was 112 lbs but pt does not believe that is true so the RN got her up and weighed her on standing scales and she was 130.6 lbs.  Pt reports she has been losing weight for the past 9 months b/c of N/V and abdominal pain which is worse when she eats.  She was just told she was only allowed to have full liquids and she is aware she's on TPN.      OBJECTIVE:    HPI:  Michele Mason is a 37 y.o. female transferred from [facility] with N/V and pain with a mass suspicious for adenocarcinoma of stomach with partial obstruction    Current Diet Order/Nutrition Support:  Regular (to be changed to [facility] per surgery PA) with dietary supplement Ensure Plus TID    Current Intake: ate 75% of full liquid dinner last night    Height:  154.9 cm   Weight: 59.4 kg (1/28- standing weight)   IBW: 47.7 kg   %IBW: 125 %   Adj BW: 51 kg  BMI: 24.6  Normal weight ( BMI 18.5 - 24.9)  UBW: 66.3 kg (9 weeks ago)   %UBW:  89 %   Weight change: has decreased 7 kg or 10.5 % in  9 weeks    Physical Assessment:  Overall Physical Appearance: crying, upset (just told she had gastric cancer per surgery PA)  GI: last BM 1/25  Oral/Mouth: denies chewing/swallowing issues    Labs:   K+ 4.9, Mg++ 1.5, Phos 4.8 (1/28); ALB 2.9 (1/26)    MNT Labs:   H&H:    HGB   Date Value Range Status   07/10/2012 9.9* 11.2 - 15.2 g/dL Final        HCT   Date Value Range Status   07/10/2012 30.7* 33.5 - 45.2 % Final       Meds:   Current Facility-Administered Medications:  adult custom parenteral nutrition  Intravenous Continuous   adult custom parenteral nutrition  Intravenous Continuous   benzocaine  7.5% oral gel  Mucous  Membrane 6x/day PRN   docusate sodium  (COLACE) capsule 100 mg Oral 2x/day   esomeprazole  (NEXIUM ) injection 40 mg Intravenous Daily   heparin  flush (HEPFLUSH) 10 units/mL injection 2-6 mL Intracatheter Q8HRS   heparin  flush (HEPFLUSH) 10 units/mL injection 2 mL Intracatheter Q1 MIN PRN   HYDROcodone -acetaminophen  (LORTAB) 7.5-325 mg per 15 mL oral liquid 10 mL Oral Q4H PRN   HYDROmorphone  (DILAUDID ) 1 mg/mL (tot vol 100 mL) in NS PCA 1 Bag Intravenous Q1H PRN   HYDROmorphone  (DILAUDID ) 1 mg/mL injection 0.6 mg Intravenous Q2H PRN   lorazepam  (ATIVAN ) tablet 1 mg Oral Q4H PRN   LR premix infusion  Intravenous Continuous   LR premix infusion  Intravenous Continuous   NS flush syringe 10-30 mL Intracatheter Q8HRS   NS flush syringe 20-30 mL Intracatheter Q1 MIN PRN   NS premix infusion  Intravenous Continuous   ondansetron  (ZOFRAN ) 2 mg/mL injection 4 mg Intravenous Q8H PRN   promethazine  (PHENERGAN ) 25 mg/mL injection 25 mg Intramuscular Q6H PRN  ASSESSMENT:    Estimated Needs:    Calories: 30-33 Cals/51 kg = 1550-1700 calories/day  Protein: 1.5-2 g/47.7 kg = 71-95 grams protein/day  Fluid: 30-33 mLs/51 kg = 1500-1700 mLs/day      PLAN / INTERVENTION    Goals: Tolerate diet/ nutrition support, Meet estimated needs for nutrients/fluids and advance TPN to goal    Monitor / Evaluate: Monitor: I/O's, Weight Status and Pertinent Labs -  Tolerance of : nutrition support    Comments: -     Recommend :   1. TPN provides 750 cal, 60 g prot, 510 dextrose  cal at ~40 ml/hr  2. Monitor K+, Mg++ and phos daily along with blood sugars and will advance TPN to goal as able  3. Goal TPN will provide 1600 cal (31 cal/kg), 95 g prot (2 g/kg), 1220 dextrose  cal (GIR 4.8), no IL due to shortage at ~ 72 ml/hr.  4. Per MD, patient allowed to only have full liquids, patient is aware.  5. Monitor daily weights.   Will continue to follow.     Nutrition Diagnosis: Altered GI function related to N/V/ abdominal pain which is worse when eating  as evidenced by patient has had a 10.5% wt loss in the past 9 weeks (severe) with an estimated intake of less than 75% of usual for greater than 1 months with a h/o of Crhon's disease and new diagnosis of gastric cancer (per surgery PA)      Michele Mason, RD, CNSC, LD 07/11/2012, 2:35 PM    Pager # [phone]

## 2012-07-11 NOTE — Discharge Instructions (Addendum)
PICC DISCHARGE INSTRUCTIONS      PICC Information:  5 FR Arrow, Power PICC  with 2 lumens.  Placed in Right, Brachial Vein with tip location of Cavoatrial Junction.  Total Catheter length is 43cm.  External Catheter length is 2cm.  Placed on 07/11/12  Placed by Jacqlyn Krauss BSN RN [facility]    Flush Instruction:  use only a syringe; flush with a push; pause motion and saline, then Heparin (10UNITS/ML) daily  Blood Sampling Instructions  Flush 10ml saline, waste 2ml, obtain specimen, flush 10-11ml saline, then heparin 83ml(10U/ml) and For Dual Lumen PICC, draw specimen from distal port after stopping infusion for 2 minutes  Dressing Care Instructions:  Change STAT LOCK securement device weekly and as needed, Change PICC cap/valve weekly and as needed, Use sterile technique, cover PICC/STAT lock with transparent dressing at least weekly and as needed. Change first PICC dressing on DATE:07/18/12      PICC Line Care   A PICC Line stands for peripherally inserted central catheter. This means that it is a catheter (thin tube for giving fluids) that is put into a vein in the upper arm and threaded to the superior vena cava (one of the large veins returning blood to the top of the heart).   BENEFITS OF THE PICC LINE   * PICC Lines can be left in for up to one year. This avoids frequent repeated needle sticks of long term therapy. They are a great choice for chemotherapy (chemical/drug treatment of cancer) and nutrition (calories given through the vein).   *They are easier to use and less invasive than other central catheters, and can easily be put in outside a hospital (for example, at a clinic), and have a lower risk of complications (things that may go wrong).   *They are easy to maintain allowing less dependence on home health care nurses.   * Dressing changes can be performed weekly by the patient or a family member.   RISKS   The main risks of PICC lines include:   * Infection.   *Clot formation.   * Risk  of a portion of the catheter traveling in the vein (an embolus).   * Bleeding at the site of insertion.   * Nerve or tendon (cord like structure which attaches muscle to bone) damage.     HOME CARE INSTRUCTIONS FOR THE PICC LINE   * Wash your hands before touching the line.   * Avoid damage to the line.   * Flush your lines with saline or as instructed.   * Clean the skin around the line insertion with soap and water or as directed.   * Keep the skin dry near the line entrance.   * Avoid swimming.   *Avoid everything that may make your line dirty.   * Change your dressings weekly or as instructed by your caregiver.   * Sometimes medications are prescribed to prevent the complication (problem) of clotting. If this is done, take medications as directed and keep appointments for blood work as directed. Report bleeding or easy bruising to your caregiver.     SEEK IMMEDIATE MEDICAL ATTENTION IF:   * There is puss-like discharge, redness, swelling, or discomfort around where the catheter enters the skin.   * You develop shortness of breath.   * You develop chest pain.   * You develop chills or fever.   *You have a fainting episode.   Document Released: 05/31/2005 Document Re-Released: 11/22/2005  ExitCare Patient Information 2008 [place], [place].    Please call report to [facility]  In [place], [place]  Prior to discharge. [phone]  Thank you

## 2012-07-11 NOTE — Progress Notes (Addendum)
SURGICAL REPORT FROM [facility]    Diagnosis: ANTRAL BIOPSIES  Findings highly suspicious for invasive adenocarcinoma, giemsa stain negative for H. Pylori     Biopsy of gastric antrum: small fragment of gastric antral mucosa with vascular congestion and mild chronic inflammation. Giemsa stain negative for H.Pylori     GE Junction Biopsy: Gastric cardia with minimal chronic inflammation     Sections of the multiple antral biopsies shown ulceration with a few of the fragments including abnormal glands with cytologically malignant features and surrounded by a desmoplastic reaction and granulation. There are also small groups of cells with malignant features within this reactive tissue. The histologic features are highly suspicious for adenocarcinoma with evidence of invasion. Clinical correlation is needed.

## 2012-07-11 NOTE — Nurses Notes (Signed)
Abdomen soft, slightly round.  Patient complains of pain "10 out of 10" in mid-epigastric radiating to LUQ.  Patient states this is pretty much constant.

## 2012-07-11 NOTE — Ancillary Notes (Addendum)
07/11/12 1931   Clinical Encounter Type   Reason for Visit Change of Condition/New Diagnosis   Referral From Nurse Marjie Skiff Spiritual Care Visit At This Time No   Visited With Patient;Father   Patient Spiritual Encounters   Spiritual Assessment Anxiety/Fear;Guilt;Helplessness/Lack of Coping;Using Prayer and Faith for Support   Interventions Explored/supported faith and beliefs;Explored emotions;Facilitated story telling;Offered empathy;Prayed;Provided supportive presence;Used appropriate religious/spiritual literature   Outcomes Spiritual Care relationship established;Patient shared/processed his/her own story;Spiritual needs identified and ways of addressing them identified;Patient processed emotions;Patient connected to spiritual support;Patient more peaceful   Patient's Goals/Hopes to use her faith to be lifted of the burden of her diagnosis and gain strength through faith and treatment   Spiritual Issues for Future Visits faith support for treatment ahead   Other Support for Patient father, sister, niece   How far is the patient from home?  3.5 hours   Family Spiritual Encounters   Spiritual Assessment Anxiety/Fear;Using Prayer and Faith for Support   Interventions Facilitated story telling;Offered empathy;Provided supportive presence   Outcomes Spiritual Care relationship established;Family shared/processed patient's story;Family identified values/goals;Family more peaceful   How has patient's hospitalization affected family? father driving home to bring more family over as support   Family's Goals/ Hopes successful treatment   Family Coping through faith and family support   [facility]  Spiritual Care Note    Patient Name:  Michele Mason  Date of Encounter:   07/11/2012     Other Pertinent Information: Patient spoke of the fear and anxiety of her new diagnosis. She recounted the difficulty of her childhood with illness "not really giving me a childhood." She spoke of guilt regarding her mother dying before the patient changed her life and became a better person. The patient also shared concern over differing opinions within the family toward her treatment (whether to go to a more specialized hospital in particular). Her father is encouraging her to continue the treatment here, and she seems agreeable at this time. 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 She hopes to be able to provide help to others that have suffered as she has. I provided prayer and a supportive presence and the patient was more peaceful following prayer. Patient's father is returning home tomorrow to bring more family, and I will request Spiritual Care to follow up to give her support while she awaits the return of family on Thursday.    Benedict Needy Ankrom, CR  Pager: [phone]  Total Time of Encounter: 41 min.  I have reviewed and cosigned this note as written.  Rev. Derl Barrow, Th.M., Clinical Pastoral Education Supervisor

## 2012-07-11 NOTE — Anesthesia Preprocedure Evaluation (Signed)
Airway       Mallampati: II    TM distance: >3 FB    Neck ROM: full  Mouth Opening: good.  No Facial hair  No Beard  No endotracheal tube present  No Tracheostomy present    Dental                    Pulmonary    Breath sounds clear to auscultation       Cardiovascular    Rhythm: regular  Rate: Normal       Other findings                Planned anesthesia type: general  ASA 3        Patient's NPO status is appropriate for Anesthesia.      Anesthetic plan and risks discussed with patient.    Anesthesia issues/risks discussed are: PONV, Stroke, Post-op Pain Management, Cardiac Events/MI and Aspiration.          Plan discussed with CRNA.                    Geta, R/B/A discussed. She understands and consents

## 2012-07-11 NOTE — Care Plan (Signed)
Problem: General Plan of Care(Adult,OB)  Goal: Plan of Care Review(Adult,OB)  The patient and/or their representative will communicate an understanding of their plan of care   Outcome: Ongoing (see interventions/notes)  1. TPN provides 750 cal, 60 g prot, 510 dextrose cal at ~40 ml/hr   2. Monitor K+, Mg++ and phos daily along with blood sugars and will advance TPN to goal as able   3. Goal TPN will provide 1600 cal (31 cal/kg), 95 g prot (2 g/kg), 1220 dextrose cal (GIR 4.8), no IL due to shortage at ~ 72 ml/hr.   4. Per MD, patient allowed to only have full liquids, patient is aware.   5. Monitor daily weights.   Will continue to follow.

## 2012-07-11 NOTE — Nurses Notes (Signed)
Paged Dr. Lance Bosch regarding pt stating her pain is not under controlled. Orders to place a one time dose for dilaudid 0.6. Also, pt continues to want the zofran with the dilaudid- dose at this time too close to last administered time. Given ok to administer zofran at this time. Will continue to monitor.

## 2012-07-11 NOTE — Nurses Notes (Signed)
Abdomen soft and slightly round as per pre-op.

## 2012-07-11 NOTE — Nurses Notes (Signed)
Pt requesting pain medication. States the 0.6 one time dose, helped her more than the 0.4. Txt paged Dr. Lance Bosch regarding changing the prn dose. Will continue to monitor.

## 2012-07-11 NOTE — OR PreOp (Signed)
 Patient arrived from room with SA and Father, on cart.  Patient is A & O x 4.

## 2012-07-11 NOTE — Care Management Notes (Signed)
In  TBR's this am with Chauncey Fischer, PA-C, pt is for an EGD today under anesthesia.  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  CCC will continue to follow for DCP needs.

## 2012-07-11 NOTE — Progress Notes (Signed)
 [place]  [facility]  Brief Post-Endoscopy Note    Patient Name: [facility] Number: [id number]  Date of Birth: 1976/04/28  Date of Service: 07/11/2012     Procedure performed: Endoscopic Ultrasound - Upper ; EGD with biopsy  Attending: Lovie Medal, M.D.  Assistant: Lynell Outhouse, MD    Pre-Operative Diagnosis: Antral mass  Post-Operative Diagnosis::   EGD: Apple core leison , friable at antrum involving pylorus. Passage of 9.8 mm regular gastroscope with mild resistance.Normal duodenal bulb and descending duodenum. S/P jumbo biopsies. Rule out sarcoidosis vs malignancy vs crohn's disease. Antral biopsies taken to rule out H. Pylori. Nodularity in fundus also biopsied    EUS:  Hypoechoic lesion involving MP. Likely T 3 lesion. No lymphadenopathy seen.     Type of Anesthesia: General  Estimate Blood Loss: None  Specimen's removed: biopsy    Findings/Interventions/Complications:   EGD: Apple core leison , friable at antrum involving pylorus. Passage of 9.8 mm regular gastroscope with mild resistance.Normal duodenal bulb and descending duodenum. S/P jumbo biopsies. Rule out sarcoidosis vs malignancy vs crohn's disease. Antral biopsies taken to rule out H. Pylori. Nodularity in fundus also biopsied    EUS:  Hypoechoic lesion involving MP. Likely T 3 lesion. No lymphadenopathy seen.         Recommendations and Follow-up:   Follow biopsy results  Findings discussed with Dr .Elijah        Discussed with Patient and Family    Lovie Medal, MD

## 2012-07-11 NOTE — Anesthesia Transfer of Care (Signed)
ANESTHESIA TRANSFER OF CARE NOTE        Anesthesia Service      [facility]         Last Vitals: Temperature: 36 C (96.8 F) (07/11/12 1022)  Heart Rate: 86 (07/11/12 1022)  BP (Non-Invasive): 99/60 mmHg (07/11/12 1022)  Respiratory Rate: 16 (07/11/12 1022)  SpO2-1: 100 % (07/11/12 1022)  Pain Score (Numeric, Faces): 10 (07/11/12 0752)    Patient transferred to area b in stable condition. Report given to RN.    1/28/2014at 10:23 AM.

## 2012-07-11 NOTE — Anesthesia Postprocedure Evaluation (Signed)
 ANESTHESIA POSTOP EVALUATION NOTE        Anesthesia Service      [facility]     07/11/2012     Last Vitals: Temperature: 36.5 C (97.7 F) (07/11/12 1045)  Heart Rate: 83 (07/11/12 1045)  BP (Non-Invasive): 107/63 mmHg (07/11/12 1045)  Respiratory Rate: 20 (07/11/12 1045)  SpO2-1: 100 % (07/11/12 1045)  Pain Score (Numeric, Faces): 10 (07/11/12 0752)    Procedure(s):  GASTROSCOPY  ENDOSCOPIC U/S UPPER  GASTROSCOPY WITH BIOPSY    Patient is sufficiently recovered from the effects of anesthesia to participate in the evaluation and has returned to their pre-procedure level.  I have reviewed and evaluated the following:  Respiratory Function: Consistent with pre anesthetic level  Cardiovascular Function: Consistent with pre anesthetic level  Mental Status: Return to pre anesthetic baseline level  Pain: Sufficiently controlled with medication  Nausea and Vomiting: Absent or sufficiently controlled with medication  Post-op Anesthetic Complications: None    Comment/ re-evaluation for any variations: None

## 2012-07-11 NOTE — Nurses Notes (Signed)
Patient arrived on floor in stable condition. See flowsheets for full assessment. Will continue to monitor.

## 2012-07-11 NOTE — Care Plan (Signed)
 Problem: General Plan of Care(Adult,OB)  Goal: Plan of Care Review(Adult,OB)  The patient and/or their representative will communicate an understanding of their plan of care   Outcome: Ongoing (see interventions/notes)  Patient spoke of the fear and anxiety of her new diagnosis. She recounted the difficulty of her childhood with illness not really giving me a childhood. She spoke of guilt regarding her mother dying before the patient changed her life and became a better person. The patient also shared concern over differing opinions within the family toward her treatment (whether to go to a more specialized hospital in particular). Her father is encouraging her to continue the treatment here, and she seems agreeable at this time. 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 She hopes to be able to provide help to others that have suffered as she has. I provided prayer and a supportive presence and the patient was more peaceful following prayer. Patient's father is returning home tomorrow to bring more family, and I will request Spiritual Care to follow up to give her support while she awaits the return of family on Thursday.

## 2012-07-12 ENCOUNTER — Encounter (HOSPITAL_COMMUNITY): Payer: Self-pay | Admitting: Gastroenterology-BA

## 2012-07-12 LAB — CBC
HCT: 27 % — ABNORMAL LOW (ref 33.5–45.2)
HGB: 8.8 g/dL — ABNORMAL LOW (ref 11.2–15.2)
MCH: 28 pg (ref 27.4–33.0)
MCHC: 32.6 g/dL (ref 32.5–35.8)
MCV: 85.7 fL (ref 78–100)
MPV: 9 fL (ref 7.5–11.5)
PLATELET COUNT: 158 10*3/uL (ref 140–450)
RBC: 3.15 MIL/uL — ABNORMAL LOW (ref 3.63–4.92)
RDW: 15.6 % — ABNORMAL HIGH (ref 12.0–15.0)
WBC: 13.1 THOU/uL — ABNORMAL HIGH (ref 3.5–11.0)

## 2012-07-12 LAB — PERFORM POC WHOLE BLOOD GLUCOSE
GLUCOSE, POINT OF CARE: 103 mg/dL (ref 70–105)
GLUCOSE, POINT OF CARE: 95 mg/dL (ref 70–105)

## 2012-07-12 LAB — BASIC METABOLIC PANEL
ANION GAP: 6 mmol/L (ref 5–16)
BUN/CREAT RATIO: 16 (ref 6–22)
BUN: 11 mg/dL (ref 6–20)
CALCIUM: 9.1 mg/dL (ref 8.5–10.4)
CARBON DIOXIDE: 26 mmol/L (ref 22–32)
CHLORIDE: 102 mmol/L (ref 96–111)
CREATININE: 0.69 mg/dL (ref 0.49–1.10)
ESTIMATED GLOMERULAR FILTRATION RATE: >59 mL/min/{1.73_m2} (ref >59)
GLUCOSE,NONFAST: 131 mg/dL (ref 65–139)
POTASSIUM: 4.3 mmol/L (ref 3.5–5.1)
SODIUM: 134 mmol/L — ABNORMAL LOW (ref 136–145)

## 2012-07-12 LAB — HISTORICAL SURGICAL PATHOLOGY SPECIMEN

## 2012-07-12 LAB — PHOSPHORUS: PHOSPHORUS: 3.8 mg/dL (ref 2.4–4.7)

## 2012-07-12 LAB — MAGNESIUM: MAGNESIUM: 1.9 mg/dL (ref 1.7–2.5)

## 2012-07-12 MED ORDER — DIPHENHYDRAMINE 25 MG CAPSULE
50.0000 mg | ORAL_CAPSULE | Freq: Four times a day (QID) | ORAL | Status: DC | PRN
Start: 2012-07-12 — End: 2012-07-13
  Administered 2012-07-12 – 2012-07-13 (×3): 50 mg via ORAL
  Filled 2012-07-12 (×3): qty 2

## 2012-07-12 MED ADMIN — heparin lock flush (porcine) 10 unit/mL intravenous solution: 0

## 2012-07-12 MED ADMIN — docusate sodium 100 mg capsule: 100 mg | ORAL | NDC 00904788980

## 2012-07-12 MED ADMIN — sodium chloride 0.9 % (flush) injection syringe: 0 mL

## 2012-07-12 MED ADMIN — sodium chloride 0.9 % (flush) injection syringe: 10 mL | NDC 08290306547

## 2012-07-12 MED ADMIN — LORazepam 0.5 mg tablet: 0.5 mg | ORAL | NDC 51079041701

## 2012-07-12 MED FILL — LORazepam 0.5 mg tablet: 0.5000 mg | ORAL | Qty: 1 | Status: AC

## 2012-07-12 MED FILL — parenteral amino acid 10 % combination no.6 intravenous solution: INTRAVENOUS | Qty: 900 | Status: AC

## 2012-07-12 MED FILL — LORazepam 0.5 mg tablet: 0.5000 mg | ORAL | Qty: 1 | Status: CN

## 2012-07-12 NOTE — CDI REVIEW (Signed)
Admissions, Findings, and Consultations:  1/26 Surg H&P: abd pain, chronic pain, sarcoidosis, and question of Crohn's   presented to [facility] on 07/04/12. Patient was previous seen by Dr. Concha Se in [place] when she underwent a colonoscopy and EGD. 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 I have personally reviewed her CT from the outside institution and note a large antral mass.  1/28 [place]: EGD w/ bx  1/28 Surg Onc: ANTRAL BIOPSIES Findings highly suspicious for invasive adenocarcinoma  1/28 Med Nutrition signed: Severe Protein -Calorie Malnutrition   1/29 Surg Onc: insomnia, Wednesday 07/19/2012 for distal gastrectomy and lymphadenectomy     Diagnostics and Results:   Procedures and Treatments:   Meds, IV's, Rx Blood:   Comments:

## 2012-07-12 NOTE — Care Plan (Signed)
Problem: General Plan of Care(Adult,OB)  Goal: Individualization/Patient Specific Goal(Adult/OB)  Outcome: Ongoing (see interventions/notes)  Patient requested to have something to help her get some rest and that she not be disturbed for a couple of hours.  Staff aware and note on door.  Chaplin came by and nurse requested they come back later or in the am, will notify patient.  Instructed on PCA and encouraged to use as needed.  Patient does not want to use unless she really needs to but does verbalize understanding.  Pollyann Glen, LPN 6/96/2952, 8:41 PM

## 2012-07-12 NOTE — Nurses Notes (Signed)
 CA entered patient's room and was found eating a cheeseburger that was brought in by patient's friend. Patient educated on the reason she is not allowed to have a regular diet due to abdominal rest and Dr. Almeda notified. Will continue to monitor patient.

## 2012-07-12 NOTE — Progress Notes (Addendum)
[facility]                                                  SURG-ONCOLOGY PROGRESS NOTE    Michele Mason, Michele Mason, 37 y.o. female  Date of Admission:  07/09/2012  Date of Birth:  Dec 16, 1975  Date of Service:  07/12/2012    Post Op Day: 1 Day Post-Op S/P Procedure(s) (LRB):  GASTROSCOPY (N/A)  ENDOSCOPIC U/S UPPER (N/A)  GASTROSCOPY WITH BIOPSY (N/A)    Subjective:   Very distressed and tearful this morning. States she was unable to sleep all night due to the news she received yesterday regarding suspicions of gastric cancer. Otherwise pain well controlled, no nausea or vomiting.     Vital Signs:  Temp (24hrs) Max:37 C (98.6 F)      Systolic (24hrs), Avg:107 mmHg, Min:96 mmHg, Max:126 mmHg    Diastolic (24hrs), Avg:65 mmHg, Min:53 mmHg, Max:74 mmHg    Temp  Avg: 36.7 C (98 F)  Min: 36 C (96.8 F)  Max: 37 C (98.6 F)  Pulse  Avg: 91.2  Min: 76  Max: 115  Resp  Avg: 17.5  Min: 15  Max: 20  SpO2  Avg: 99.1 %  Min: 96 %  Max: 100 %  MAP (Non-Invasive)  Avg: 75 mmHG  Min: 74 mmHG  Max: 77 mmHG  Pain Score (Numeric, Faces): 7    Current Medications:    Current Facility-Administered Medications:  acetaminophen (TYLENOL) tablet 650 mg Oral Q4H PRN   adult custom parenteral nutrition  Intravenous Continuous   benzocaine 7.5% oral gel  Mucous Membrane 6x/day PRN   docusate sodium (COLACE) capsule 100 mg Oral 2x/day   esomeprazole (NEXIUM) injection 40 mg Intravenous Daily   heparin flush (HEPFLUSH) 10 units/mL injection 2-6 mL Intracatheter Q8HRS   heparin flush (HEPFLUSH) 10 units/mL injection 2 mL Intracatheter Q1 MIN PRN   HYDROcodone-acetaminophen (LORTAB) 7.5-325 mg per 15 mL oral liquid 10 mL Oral Q4H PRN   HYDROmorphone (DILAUDID) 1 mg/mL (tot vol 100 mL) in NS PCA 1 Bag Intravenous Q1H PRN   HYDROmorphone (DILAUDID) 1 mg/mL injection 0.6 mg Intravenous Q2H PRN   lorazepam (ATIVAN) tablet 1 mg Oral Q4H PRN   LR premix infusion  Intravenous Continuous   LR premix infusion  Intravenous Continuous    NS flush syringe 10-30 mL Intracatheter Q8HRS   NS flush syringe 20-30 mL Intracatheter Q1 MIN PRN   NS premix infusion  Intravenous Continuous   ondansetron (ZOFRAN) 2 mg/mL injection 4 mg Intravenous Q8H PRN   promethazine (PHENERGAN) 25 mg/mL injection 25 mg Intramuscular Q6H PRN       Today's Physical Exam:  General: NAD  HENT:Head atraumatic and normocephalic  Lungs: breathing non-labored  Abdomen: tenderness to palpation in LUQ; soft; non-distended; healed vertical scar in midline   Psychiatric: anxious, tired, worried    I/O:  I/O last 24 hours:      Intake/Output Summary (Last 24 hours) at 07/12/12 0952  Last data filed at 07/12/12 0600   Gross per 24 hour   Intake 3100.4 ml   Output      0 ml   Net 3100.4 ml     I/O current shift:       Prophylaxis:  Date Started Date Completed   DVT/PE  Not indicated     GI nexium  Nutrition/Residuals:  Full Liquid    Labs  (Please indicate ordered or reviewed)  Reviewed:   Lab Results for Last 24 Hours:    WBC - 13.1  H/H - 8.8/27.0  Platelets - 158  Na - 134  K - 4.3  BUN/Cr - 11/0.69    Diagnostic tests:     EGD/US procedure, 07/11/12:  EGD: Apple core leison , friable at antrum involving pylorus. Passage of 9.8 mm regular gastroscope with mild resistance.Normal duodenal bulb and descending duodenum. S/P jumbo biopsies. Rule out sarcoidosis vs malignancy vs crohn's disease. Antral biopsies taken to rule out H. Pylori. Nodularity in fundus also biopsied   EUS: Hypoechoic lesion involving MP. Likely T 3 lesion. No lymphadenopathy seen.     Assessment/ Plan:   37 year old female with antral mass highly suspicious for gastric adenocarcinoma per biopsies from [facility].   -Full liquid diet as she has partial gastric outlet obstruction  -Continue with TPN to increase nutrition  -Dilaudid PCA for optimal pain control  -Will continue liquid loratab for PO pain control  -Continue Zofran and phenergan for nausea prn.     -Benadryl and ativan for insomnia management, see additional note for details      [name], MD 07/12/2012, 9:58 AM    I saw and examined this patient with the resident above.  Please see the resident's note, which I have carefully reviewed, for full details. I agree with the findings and plan of care as documented in the resident's note.  Any additions/exceptions are edited/noted.    Overall feels better, but emotional today.  On exam, abdomen benign.  Plan for full liquids only, PCA for pain, TPN, sleep aids for insomnia.  The patient is on the schedule for Wednesday 07/19/2012 for distal gastrectomy and lymphadenectomy.  Of note, her biopsy results have returned and are consistent with intestinal type gastric adenocarcinoma.  I will present her to the GI tumor board for further discussion tomorrow afternoon.  I will keep her informed of their recommendations.    [name], MD 07/12/2012 6:56 PM  Assistant Professor, Division of Surgical Oncology  [facility] Department of Surgery

## 2012-07-12 NOTE — Progress Notes (Addendum)
Patient continues to be unable to sleep and remains frustrated. She would like a sleep aid. She states she has had a "bad reaction" to Ambien (migrain headaches) and states she would like something else. She was given 50 mg po benadryl (every 6 hours prn) and her ativan was switched from 1mg  every 4 hours prn to 0.5mg  every 4 hours (scheduled). Should this still not adequately treat her insomnia, we will add on temazepam. Please page surg onc with any questions or concerns.    Chauncey Fischer, PA-C 07/12/2012, 1:17 PM

## 2012-07-12 NOTE — Pharmacy (Signed)
 Discharge Pharmacy Service  07/12/2012    Cottingham,Katrisha  [birth year]  756/A/SURG ONCOLOGY    Date of service: 07/12/2012    No Known Allergies      Met with patient to :Initiate Discharge Pharmacy Services:  Decline  Freedom of Choice Offerings: Accept  Review of Patient's Medication List: Decline        Kelly CHRISTELLA Erichsen, CPHT 07/12/2012, 4:00 PM

## 2012-07-12 NOTE — CDI WORKSHEET (Addendum)
DRG NLV      Working DRG 1: 374 Digestive malignancy w [facility] 3/2 Principle Diagnosis: adenocarcinoma/gastric      Working DRG 2: 326 Stomach w [facility] 3/2 Principle Diagnosis:       Final MS-DRG:  326 Stomach w [facility]     Reason Code: 1-Final/Concurrent DRG Match - Optimal Selection    Comments:     Final Coder:  [place]             Principle Procedure:  2/5 OP [place]: Gastric cancer. 2. Sarcoidosis.   1. Exploratory laparotomy. 2. Lysis of adhesions. 3. Prior abdominal scar excision.   4. Distal gastrectomy. 5. D2 lymphadenectomy. 6. Umbilical hernia repair   Procedure Date:   Secondary Dx:  Sarcoidosis  Crohn's  Severe protein cal malnutrition-Y  2/4  Left DeQuervains tenosynovitis and carpal tunnel syndrome  2/10   HypoK-N  Chronic pain d/o-Y    2/12  Hypophos-N  Anemia chr dz-Y  Acute bld loss anemia-N  Anxiety-Y  2/18  Depression  Post-op ileus Secondary Procedures:  1/28 EDG w/ bx-[place]  1/28 TPN-[place]  2/10 [place]        Past Medical Hx:     Comments:     Home Medications:

## 2012-07-13 LAB — CBC
HCT: 30.6 % — ABNORMAL LOW (ref 33.5–45.2)
HGB: 9.7 g/dL — ABNORMAL LOW (ref 11.2–15.2)
MCH: 27.5 pg (ref 27.4–33.0)
MCHC: 31.6 g/dL — ABNORMAL LOW (ref 32.5–35.8)
MCV: 87 fL (ref 78–100)
MPV: 8.7 fL (ref 7.5–11.5)
PLATELET COUNT: 143 10*3/uL (ref 140–450)
RBC: 3.52 MIL/uL — ABNORMAL LOW (ref 3.63–4.92)
RDW: 15.7 % — ABNORMAL HIGH (ref 12.0–15.0)
WBC: 10.4 THOU/uL (ref 3.5–11.0)

## 2012-07-13 LAB — BASIC METABOLIC PANEL, FASTING
ANION GAP: 4 mmol/L — ABNORMAL LOW (ref 5–16)
BUN/CREAT RATIO: 20 (ref 6–22)
BUN: 13 mg/dL (ref 6–20)
CALCIUM: 8.5 mg/dL (ref 8.5–10.4)
CARBON DIOXIDE: 24 mmol/L (ref 22–32)
CHLORIDE: 104 mmol/L (ref 96–111)
CREATININE: 0.66 mg/dL (ref 0.49–1.10)
ESTIMATED GLOMERULAR FILTRATION RATE: >59 mL/min/{1.73_m2} (ref >59)
GLUCOSE, FASTING: 174 mg/dL — ABNORMAL HIGH (ref 70–105)
POTASSIUM: 4.3 mmol/L (ref 3.5–5.1)
SODIUM: 132 mmol/L — ABNORMAL LOW (ref 136–145)

## 2012-07-13 LAB — PERFORM POC WHOLE BLOOD GLUCOSE
GLUCOSE, POINT OF CARE: 127 mg/dL — ABNORMAL HIGH (ref 70–105)
GLUCOSE, POINT OF CARE: 99 mg/dL (ref 70–105)
GLUCOSE, POINT OF CARE: 97 mg/dL (ref 70–105)

## 2012-07-13 MED ORDER — SENNOSIDES 8.6 MG-DOCUSATE SODIUM 50 MG TABLET
1.0000 | ORAL_TABLET | Freq: Two times a day (BID) | ORAL | Status: DC
Start: 2012-07-13 — End: 2012-07-19
  Administered 2012-07-13 – 2012-07-16 (×7): 1 via ORAL
  Administered 2012-07-17 – 2012-07-18 (×4): 0 via ORAL
  Filled 2012-07-13 (×13): qty 1

## 2012-07-13 MED ORDER — MAGNESIUM SULFATE 2 GRAM/50 ML (4 %) IN WATER INTRAVENOUS PIGGYBACK
2.0000 g | INJECTION | INTRAVENOUS | Status: AC
Start: 2012-07-13 — End: 2012-07-13
  Administered 2012-07-13: 2 g via INTRAVENOUS
  Filled 2012-07-13: qty 50

## 2012-07-13 MED ORDER — MORPHINE ER 30 MG TABLET,EXTENDED RELEASE
30.0000 mg | ORAL_TABLET | Freq: Two times a day (BID) | ORAL | Status: DC
Start: 2012-07-13 — End: 2012-07-19
  Administered 2012-07-13 – 2012-07-17 (×10): 30 mg via ORAL
  Administered 2012-07-18 – 2012-07-19 (×2): 0 mg via ORAL
  Filled 2012-07-13 (×10): qty 1

## 2012-07-13 MED ADMIN — LORazepam 0.5 mg tablet: 0.5 mg | ORAL | NDC 51079041701

## 2012-07-13 MED ADMIN — heparin lock flush (porcine) 10 unit/mL intravenous solution: 0

## 2012-07-13 MED ADMIN — sodium chloride 0.9 % (flush) injection syringe: 0 mL

## 2012-07-13 MED ADMIN — docusate sodium 100 mg capsule: 100 mg | ORAL | NDC 00904788980

## 2012-07-13 MED ADMIN — sodium chloride 0.9 % (flush) injection syringe: 10 mL | NDC 08290306547

## 2012-07-13 MED ADMIN — HYDROmorphone 50 mg/50 mL in 0.9 % sodium chloride injection: 1 | INTRAVENOUS | NDC 61553024296

## 2012-07-13 MED ADMIN — LORazepam 0.5 mg tablet: 0 mg | ORAL

## 2012-07-13 MED FILL — diphenhydrAMINE 25 mg capsule: 75.0000 mg | ORAL | Qty: 2 | Status: AC

## 2012-07-13 MED FILL — HYDROmorphone 1 mg/mL injection syringe: 0.2000 mg | INTRAMUSCULAR | Qty: 1 | Status: AC

## 2012-07-13 MED FILL — parenteral amino acid 10 % combination no.6 intravenous solution: INTRAVENOUS | Qty: 950 | Status: AC

## 2012-07-13 NOTE — Care Plan (Signed)
Problem: General Plan of Care(Adult,OB)  Goal: Plan of Care Review(Adult,OB)  The patient and/or their representative will communicate an understanding of their plan of care   Outcome: Ongoing (see interventions/notes)  Pending patient progress patient to discharge to home with family.     The patient will continue to be evaluated for developing discharge needs.     Case Manager: Harriet Masson, RN 07/13/2012, 11:26 AM  Phone: [phone]

## 2012-07-13 NOTE — Care Management Notes (Signed)
[facility]  Care Management Note    Patient Name: Michele Mason  Date of Birth: March 21, 1976  Sex: female  Date/Time of Admission: 07/09/2012  1:37 PM  Room/Bed: 756/A  Payor: [place]ICA[place]  Plan: [facility] MEDICAID  Product Type: Medicaid     LOS: 4 days   PCP: No Established Pcp    Admitting Diagnosis:  gastric mass  gastric tumor  gastic cancer    Assessment:  Patient 37 year old female s/p GASTROSCOP, ENDOSCOPIC U/S UPPER, GASTROSCOPY WITH BIOPSY.  Receiving pain management, multiple attempts with changes to provide pain relief. Will continue to follow.    Discharge Plan:  Home(Patient/Family Member/other) (code 1)  Pending patient progress patient to discharge to home with family.     The patient will continue to be evaluated for developing discharge needs.     Case Manager: Harriet Masson, RN 07/13/2012, 11:26 AM  Phone: [phone]

## 2012-07-13 NOTE — Progress Notes (Addendum)
[facility]                                                  SURG-ONCOLOGY PROGRESS NOTE    Michele Mason, Michele Mason, 37 y.o. female  Date of Admission:  07/09/2012  Date of Birth:  03/13/1976  Date of Service:  07/13/2012    Post Op Day: 2 Days Post-Op S/P Procedure(s) (LRB):  GASTROSCOPY (N/A)  ENDOSCOPIC U/S UPPER (N/A)  GASTROSCOPY WITH BIOPSY (N/A)    Subjective:   No acute events overnight. States she is having pain, especially after eating the cheeseburger her friends/family brought in. States she is still not sleeping very well and is stressed.     Vital Signs:  Temp (24hrs) Max:37.1 C (98.8 F)      Systolic (24hrs), Avg:104 mmHg, Min:95 mmHg, Max:109 mmHg    Diastolic (24hrs), Avg:64 mmHg, Min:59 mmHg, Max:72 mmHg    Temp  Avg: 36.8 C (98.3 F)  Min: 36.7 C (98.1 F)  Max: 37.1 C (98.8 F)  Pulse  Avg: 87.3  Min: 82  Max: 96  Resp  Avg: 17.7  Min: 16  Max: 18  SpO2  Avg: 99.8 %  Min: 99 %  Max: 100 %  Pain Score (Numeric, Faces): 9    Current Medications:    Current Facility-Administered Medications:  acetaminophen (TYLENOL) tablet 650 mg Oral Q4H PRN   adult custom parenteral nutrition  Intravenous Continuous   adult custom parenteral nutrition  Intravenous Continuous   benzocaine 7.5% oral gel  Mucous Membrane 6x/day PRN   diphenhydrAMINE (BENADRYL) capsule 75 mg Oral Q6H PRN   docusate sodium (COLACE) capsule 100 mg Oral 2x/day   esomeprazole (NEXIUM) injection 40 mg Intravenous Daily   heparin flush (HEPFLUSH) 10 units/mL injection 2-6 mL Intracatheter Q8HRS   heparin flush (HEPFLUSH) 10 units/mL injection 2 mL Intracatheter Q1 MIN PRN   HYDROmorphone (DILAUDID) 1 mg/mL (tot vol 100 mL) in NS PCA 1 Bag Intravenous Q1H PRN   HYDROmorphone (DILAUDID) 1 mg/mL injection 0.2 mg Intravenous Q2H PRN   lorazepam (ATIVAN) tablet 0.5 mg Oral 4x/day   LR premix infusion  Intravenous Continuous   LR premix infusion  Intravenous Continuous    morphine (MS CONTIN) extended release tablet 30 mg Oral Q12H   NS flush syringe 10-30 mL Intracatheter Q8HRS   NS flush syringe 20-30 mL Intracatheter Q1 MIN PRN   NS premix infusion  Intravenous Continuous   ondansetron (ZOFRAN) 2 mg/mL injection 4 mg Intravenous Q8H PRN   promethazine (PHENERGAN) 25 mg/mL injection 25 mg Intramuscular Q6H PRN   sennosides-docusate sodium (SENOKOT-S) 8.6-50mg  per tablet 1 Tab Oral 2x/day       Today's Physical Exam:  General: NAD  HENT:Head atraumatic and normocephalic  Lungs: breathing non-labored  Abdomen: tenderness to palpation in LUQ; soft; non-distended; healed vertical scar in midline   Psychiatric: tired, stressed    I/O:  I/O last 24 hours:      Intake/Output Summary (Last 24 hours) at 07/13/12 1628  Last data filed at 07/13/12 0800   Gross per 24 hour   Intake 2018.4 ml   Output      0 ml   Net 2018.4 ml     I/O current shift:       Prophylaxis:  Date Started Date Completed   DVT/PE  Not indicated  GI nexium         Nutrition/Residuals:  Full Liquid    Labs  (Please indicate ordered or reviewed)  Reviewed:   Lab Results for Last 24 Hours:    WBC - 10.4  H/H - 9.7/30.6  Platelets - 143  Na - 132  K - 4.3  BUN/Cr - 13/0.66    Diagnostic tests:   None    Pathology:   A. ANTRAL MASS, BIOPSY:  - Intestinal-type adenocarcinoma.     B. GASTRIC ANTRUM, BIOPSY:  - Chronic inactive gastritis.  - No evidence of Helicobacter pylori infection on H&E morphology.    C. GASTRIC FUNDUS, BIOPSY:  - Chronic inactive gastritis.  - No evidence of Helicobacter pylori infection on H&E morphology.  - No evidence of intestinal metaplasia or malignancy.       Assessment/ Plan:   37 year old female with antral mass highly suspicious for gastric adenocarcinoma per biopsies from [facility]. 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   - Continue full liquid diet as she has partial gastric outlet obstruction   -Continue with TPN to increase nutrition  -Consulted Pain Resource team for suggestions today - recommend increasing Dilaudid PCA to 0.4mg  every 8 minutes, morphine sulphate-MS Contin 30 BID, tylenol 650 q4-q6h, discontinue Lortab due to nausea  -Continue Zofran and phenergan for nausea prn.    -Increase Benadryl to 75mg  for insomnia, and continue Ativan PRN      [name], MD 07/13/2012, 4:28 PM    I saw and examined this patient with the resident above.  Please see the resident's note, which I have carefully reviewed, for full details. I agree with the findings and plan of care as documented in the resident's note.  Any additions/exceptions are edited/noted.    I had a long discussion with the patient again today.  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  We discussed the need for distal gastrectomy and lymphadenectomy.  I have presented her at tumor board today, and given her poor functional status, pain, and malnutrition, it was agreed that with a T3 N0 tumor, surgical resection would be her first step in therapy.  She will require adjuvant chemoradiation per the McDonald trial.    She is on the operating room schedule for next Wednesday, 07/19/12.  This is not because we could not get time sooner.  Rather, she presented to [facility] with severe malnutrition, weight loss, and chronic pain.  There is quality data in the literature suggesting that surgical outcomes improve with improved nutritional status.  Thus, we will plan for a full week of TPN to reverse her catabolic state and improve her nutritional status prior to surgical intervention.    [name], MD 07/13/2012 6:16 PM  Assistant Professor, Division of Surgical Oncology  [facility] Department of Surgery

## 2012-07-13 NOTE — Nurses Notes (Signed)
 Patient up and ambulated with family.  No other changes noted in assessment since this am.  Montie Jenkins Barnacle, LPN 8/69/7985, 3:53 PM

## 2012-07-13 NOTE — Care Plan (Signed)
Problem: General Plan of Care(Adult,OB)  Goal: Plan of Care Review(Adult,OB)  The patient and/or their representative will communicate an understanding of their plan of care   Outcome: Ongoing (see interventions/notes)  Plan of care reviewed with patient. Patient has been non compliant with full liquid diet, patient educated as to why the diet remains full liquid, and stated that she understands the teaching. Patient remains on TPN and blood glucose levels have needed no intervention. Patient's pain is being controlled with Dilaudid PCA and PRN Lortab. Patient education and discharge planning remain in progress. Patient remains on stool softeners, still has not had a bowel movement since 1/25.

## 2012-07-14 ENCOUNTER — Inpatient Hospital Stay (HOSPITAL_COMMUNITY): Payer: MEDICAID

## 2012-07-14 LAB — PERFORM POC WHOLE BLOOD GLUCOSE
GLUCOSE, POINT OF CARE: 124 mg/dL — ABNORMAL HIGH (ref 70–105)
GLUCOSE, POINT OF CARE: 119 mg/dL — ABNORMAL HIGH (ref 70–105)
GLUCOSE, POINT OF CARE: 137 mg/dL — ABNORMAL HIGH (ref 70–105)
GLUCOSE, POINT OF CARE: 120 mg/dL — ABNORMAL HIGH (ref 70–105)

## 2012-07-14 LAB — BASIC METABOLIC PANEL, FASTING
ANION GAP: 3 mmol/L — CL (ref 5–16)
BUN/CREAT RATIO: 25 — ABNORMAL HIGH (ref 6–22)
BUN: 15 mg/dL (ref 6–20)
CALCIUM: 8.6 mg/dL (ref 8.5–10.4)
CARBON DIOXIDE: 26 mmol/L (ref 22–32)
CHLORIDE: 104 mmol/L (ref 96–111)
CREATININE: 0.6 mg/dL (ref 0.49–1.10)
ESTIMATED GLOMERULAR FILTRATION RATE: >59 mL/min/{1.73_m2} (ref >59)
GLUCOSE, FASTING: 130 mg/dL — ABNORMAL HIGH (ref 70–105)
POTASSIUM: 4.4 mmol/L (ref 3.5–5.1)
SODIUM: 133 mmol/L — ABNORMAL LOW (ref 136–145)

## 2012-07-14 LAB — CBC
HCT: 27 % — ABNORMAL LOW (ref 33.5–45.2)
HGB: 8.6 g/dL — ABNORMAL LOW (ref 11.2–15.2)
MCH: 28 pg (ref 27.4–33.0)
MCHC: 32 g/dL — ABNORMAL LOW (ref 32.5–35.8)
MCV: 87.5 fL (ref 78–100)
MPV: 9 fL (ref 7.5–11.5)
PLATELET COUNT: 120 10*3/uL — ABNORMAL LOW (ref 140–450)
RBC: 3.08 MIL/uL — ABNORMAL LOW (ref 3.63–4.92)
RDW: 15.9 % — ABNORMAL HIGH (ref 12.0–15.0)
WBC: 10.3 THOU/uL (ref 3.5–11.0)

## 2012-07-14 MED ORDER — DIPHENHYDRAMINE 50 MG/ML INJECTION SOLUTION
50.0000 mg | Freq: Four times a day (QID) | INTRAMUSCULAR | Status: DC | PRN
Start: 2012-07-14 — End: 2012-07-19
  Administered 2012-07-14 – 2012-07-18 (×6): 50 mg via INTRAVENOUS
  Filled 2012-07-14 (×6): qty 1

## 2012-07-14 MED ADMIN — LORazepam 0.5 mg tablet: 0.5 mg | ORAL | NDC 51079041701

## 2012-07-14 MED ADMIN — sodium chloride 0.9 % (flush) injection syringe: 0 mL | NDC 08290306547

## 2012-07-14 MED ADMIN — diphenhydrAMINE 25 mg capsule: 50 mg | ORAL | NDC 00904530661

## 2012-07-14 MED ADMIN — heparin lock flush (porcine) 10 unit/mL intravenous solution: 0

## 2012-07-14 MED ADMIN — docusate sodium 100 mg capsule: 100 mg | ORAL | NDC 00904788980

## 2012-07-14 MED ADMIN — heparin lock flush (porcine) 10 unit/mL intravenous solution: 0 mL

## 2012-07-14 MED ADMIN — sodium chloride 0.9 % (flush) injection syringe: 10 mL | NDC 08290306547

## 2012-07-14 MED ADMIN — sodium chloride 0.9 % (flush) injection syringe: 0 mL

## 2012-07-14 MED FILL — parenteral amino acid 10 % combination no.6 intravenous solution: INTRAVENOUS | Qty: 950 | Status: AC

## 2012-07-14 NOTE — Consults (Addendum)
[facility]  History and Physical    Michele Mason  07/14/2012    Information obtained from: Pt    CC: Left wrist pain    HPI: Michele Mason is a 37 y.o. Black/African American female who was admitted to surg onc service on 07/09/12 with antral mass highly suspicious for gastric adenocarcinoma per biopsies from [facility]. 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 The current plan is for distal gastrectomy with lymphadenectomy, and the need for post-operative chemoradiation. Currently receiving TPN preop.  Ortho has been consulted due to increasing left wrist pain that began 2 mo ago. 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 Sharp pain, worse with use on the radial aspect of wrist. States she has numbness in thumb/index/middle fingers that awake her from sleep nightly. 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 No previous surgeries on the wrist.     Allergies: No Known Allergies    PMH:   Past Medical History   Diagnosis Date   . Moderate protein-calorie malnutrition 07/10/2012   . Gastric cancer 07/10/2012   . Chronic pain disorder 07/10/2012   . Sarcoidosis 07/10/2012   . Cough    . Shortness of breath    . Palpitations    . CVA (cerebrovascular accident)      left hemipareisis   . Nutritional disorder    . Weight loss, abnormal    . Anemia        PSH:   Past Surgical History   Procedure Laterality Date   . Hx small bowel resection       twice   . Hx ovarian cyst removal     .   07/11/2012     GASTROSCOPY performed by Knox Saliva, MD at [facility] OR ENDO   . Endoscopic ultrasound upper  07/11/2012     ENDOSCOPIC U/S UPPER performed by Knox Saliva, MD at [facility] OR ENDO   . Gastroscopy with biopsy  07/11/2012     GASTROSCOPY WITH BIOPSY performed by Knox Saliva, MD at [facility] OR ENDO       [facility]: No h/o problems with anesthesia     Social: Left handed. Smoker, 1/2 pack/day. occasional alcohol and denies any other drug use    Meds: TPN, ativan, dilaudid    ROS: As per HPI, otherwise all other systems were negative except for GI discomfort and irregularity.    PE:  Vitals: BP 123/68   Pulse 87   Temp(Src) 36.7 C (98 F)   Resp 18   Ht 1.549 m (5\' 1" )   Wt 62.7 kg (138 lb 3.7 oz)   BMI 26.13 kg/m2   SpO2 98%  General: NAD   HEENT: NCAT   Resp: non labored   CVS: Regular rate   Abd: non distended   Skin: warm and dry   Psych: Distressed affect with recent cancer diagnosis    LUE: No erythema or signs of infection. (+) finkelstein test. (+) numbness in T/I/L finger with phalen's. TTP over 1st dorsal compartment. Skin intact. Normal sensation in M/U/R/A nerve distribution   Pt able to make 'ok' sign, perform 'thumbs up', and cross fingers   Cap refill <3 sec, 2+ radial pulse    Labs: I have reviewed all current lab results     Imaging: XR LUE - No fxs note    Assessment:  37 y.o. female with Left DeQuervains tenosynovitis and carpal tunnel syndrome  Fracture: No    Plan:  No acute ortho surgical intervention needed  - Thumb spica splint ordered from [facility], to be fitted   - Wear splint at all times  - If no improvement in 2-3 days, will perform corticosteroid injxn in wrist  - Will continue to monitor and d/w staff    Verlin Grills, MD  Ortho Resident  Bari Edward, MD 07/14/2012, 7:08 PM  Pager # [phone]

## 2012-07-14 NOTE — Care Management Notes (Signed)
 [place]  [facility]  Care Management Note    Patient Name: Michele Mason  Date of Birth: 06/07/1976  Sex: female  Date/Time of Admission: 07/09/2012  1:37 PM  Room/Bed: 756/A  Payor: [place] MEDICAID  Plan: [facility] MEDICAID  Product Type: Medicaid     LOS: 5 days   PCP: No Established Pcp    Admitting Diagnosis:  gastric mass  gastric tumor  gastic cancer    Assessment:   CCC follow up with Viva, resident r/t IP consult for care  Management. Viva states that patient has been offered chaplain service, medication adjustment. Patient continues to refuse. Jacinta does not feel at this time that there is anything care Management dept would need to address. Will continue to follow.      The patient will continue to be evaluated for developing discharge needs.     Case Manager: Alix Pereyra, RN 07/14/2012, 4:45 PM  Phone: [phone]

## 2012-07-14 NOTE — Ancillary Notes (Signed)
Personal assistant.  [phone]  Delivery of left thumb spica.  Adjusted for fit/function.  Left instructions with patient on fit. Questions/needs, please call.

## 2012-07-14 NOTE — CDI REVIEW (Signed)
 Admissions, Findings, and Consultations:  1/30 Surg Onc: antral mass highly suspicious for gastric adenocarcinoma per biopsies from [facility]. 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   partial gastric outlet obstruction, severe  malnutrition       Diagnostics and Results:   Procedures and Treatments:   Meds, IV's, Rx Blood:   Comments:

## 2012-07-14 NOTE — Care Plan (Signed)
Problem: General Plan of Care(Adult,OB)  Goal: Plan of Care Review(Adult,OB)  The patient and/or their representative will communicate an understanding of their plan of care   Outcome: Ongoing (see interventions/notes)  1. TPN at goal and meeting 100% of estimated needs.   2. Continue FL diet with Chocolate ensure complete BID per pt request.   3. Monitor daily weights.   4. Continue to monitor electrolytes, blood sugars, Mg++, Phos while on TPN.   5. Monitor daily weights.   Will follow.

## 2012-07-14 NOTE — Care Plan (Signed)
 Problem: General Plan of Care(Adult,OB)  Goal: Plan of Care Review(Adult,OB)  The patient and/or their representative will communicate an understanding of their plan of care   Outcome: Ongoing (see interventions/notes)  Patient shared her fear of her impending surgery next Wednesday to remove part of her stomach, but she also hopes that this will allow for her to be able to eat and regain some strength and nourishment. She continues to rely on faith and family support as a source of strength, and was thankful for continued chaplain support. She spoke of the fear of her uncertain future, and a feeling of death being around her that keeps her awake at night. Her family encouraged her to continue to fight against those feelings, and I affirmed her for her bravery in seeking out treatment. Patient and family accepted prayer well. Patient requested prayer before her surgery Wednesday, and possible support on Sunday or Monday when her family may be absent. Spiritual Care will follow.

## 2012-07-14 NOTE — Care Plan (Signed)
Problem: General Plan of Care(Adult,OB)  Goal: Plan of Care Review(Adult,OB)  The patient and/or their representative will communicate an understanding of their plan of care   Outcome: Ongoing (see interventions/notes)  POC reviewed. Pain controlled with dilaudid PCA and scheduled meds. Tolerating full liquid diet, TPN infusing per orders. Able to ambulate independently. Voiding in BR. Pt without BM since 1/25, service aware, senokot ordered and started tonight. Abdomen assessed for changes, remains unchanged this shift. Intermittent nausea controlled with prn zofran dose. 02 sats stable on RA. D/C planning in progress.

## 2012-07-14 NOTE — Nurses Notes (Signed)
HS FS result 97, no intervention needed, will continue to monitor.

## 2012-07-14 NOTE — Nurses Notes (Signed)
Per report pt with home meds at bedside. Upon assessment pt asked about home meds, states that they have been here since she was admitted, pt denies taking home meds at any time during hospital admission. Explained to pt that policy is that home meds can not be stored at the bedside. Pt visibly upset since she has had them since admission. Pt agreeable to sending home meds home with family. Pt removed home meds from purse and handed to female family member who then left the room for the evening. Will continue to monitor.

## 2012-07-14 NOTE — Progress Notes (Addendum)
 Michele Mason  [facility]                                                  SURG-ONCOLOGY PROGRESS NOTE    Michele Mason, Michele Mason, 37 y.o. female  Date of Admission:  07/09/2012  Date of Birth:  1976-05-13  Date of Service:  07/14/2012    Post Op Day: 3 Days Post-Op S/P Procedure(s) (LRB):  GASTROSCOPY (N/A)  ENDOSCOPIC U/S UPPER (N/A)  GASTROSCOPY WITH BIOPSY (N/A)    Subjective:   No acute events overnight. States her left wrist continues to bother her. States this has an ongoing problem for the last 2 months. Patient states she feels likes she is loosing strength in her left hand but denies recent trauma, edema, etc. States she broke this arm as a child (5yo) and she feels like it is possible it is arthritis. States her pain is controlled and she denies nausea or vomiting. She states she is very anxious and having trouble sleeping because of her thoughts regarding her new diagnosis of gastric ca.    Vital Signs:  Temp (24hrs) Max:37.2 C (99 F)      Systolic (24hrs), Avg:110 mmHg, Min:95 mmHg, Max:120 mmHg    Diastolic (24hrs), Avg:68 mmHg, Min:59 mmHg, Max:75 mmHg    Temp  Avg: 36.8 C (98.3 F)  Min: 36.7 C (98.1 F)  Max: 37.2 C (99 F)  Pulse  Avg: 83.2  Min: 49  Max: 96  Resp  Avg: 18  Min: 16  Max: 20  SpO2  Avg: 99.3 %  Min: 98 %  Max: 100 %  Pain Score (Numeric, Faces): 3    Current Medications:    Current Facility-Administered Medications:  acetaminophen  (TYLENOL ) tablet 650 mg Oral Q4H PRN   adult custom parenteral nutrition  Intravenous Continuous   adult custom parenteral nutrition  Intravenous Continuous   benzocaine  7.5% oral gel  Mucous Membrane 6x/day PRN   diphenhydrAMINE  (BENADRYL ) 50 mg/mL injection 50 mg Intravenous Q6H PRN   docusate sodium  (COLACE) capsule 100 mg Oral 2x/day   esomeprazole  (NEXIUM ) injection 40 mg Intravenous Daily   heparin  flush (HEPFLUSH) 10 units/mL injection 2-6 mL Intracatheter Q8HRS   heparin  flush (HEPFLUSH) 10 units/mL injection 2 mL Intracatheter Q1 MIN PRN      HYDROmorphone  (DILAUDID ) 1 mg/mL (tot vol 100 mL) in NS PCA 1 Bag Intravenous Q1H PRN   HYDROmorphone  (DILAUDID ) 1 mg/mL injection 0.2 mg Intravenous Q2H PRN   lorazepam  (ATIVAN ) tablet 0.5 mg Oral 4x/day   LR premix infusion  Intravenous Continuous   LR premix infusion  Intravenous Continuous   morphine  (MS CONTIN ) extended release tablet 30 mg Oral Q12H   NS flush syringe 10-30 mL Intracatheter Q8HRS   NS flush syringe 20-30 mL Intracatheter Q1 MIN PRN   NS premix infusion  Intravenous Continuous   ondansetron  (ZOFRAN ) 2 mg/mL injection 4 mg Intravenous Q8H PRN   promethazine  (PHENERGAN ) 25 mg/mL injection 25 mg Intramuscular Q6H PRN   sennosides-docusate sodium  (SENOKOT-S) 8.6-50mg  per tablet 1 Tab Oral 2x/day       Today's Physical Exam:  General: NAD  HENT:Head atraumatic and normocephalic  Lungs: breathing non-labored  Abdomen: tenderness to palpation in LUQ; soft; non-distended; healed vertical scar in midline   Psychiatric: tired, stressed    I/O:  I/O last 24 hours:      Intake/Output  Summary (Last 24 hours) at 07/14/12 1155  Last data filed at 07/14/12 1100   Gross per 24 hour   Intake   3552 ml   Output      0 ml   Net   3552 ml     I/O current shift:  01/31 0800 - 01/31 1559  In: 408 [I.V.:408]  Out: -     Prophylaxis:  Date Started Date Completed   DVT/PE  Not indicated     GI nexium          Nutrition/Residuals:  Full Liquid    Labs  (Please indicate ordered or reviewed)  Reviewed:   Lab Results for Last 24 Hours:    WBC - 10.3  H/H - 8.6/27  Platelets - 120  Na - 133  K - 4.4  BUN/Cr - 13/0.66    Diagnostic tests:   None    Pathology:   A. ANTRAL MASS, BIOPSY:  - Intestinal-type adenocarcinoma.     B. GASTRIC ANTRUM, BIOPSY:  - Chronic inactive gastritis.  - No evidence of Helicobacter pylori infection on H&E morphology.    C. GASTRIC FUNDUS, BIOPSY:  - Chronic inactive gastritis.  - No evidence of Helicobacter pylori infection on H&E morphology.  - No evidence of intestinal metaplasia or malignancy.        Assessment/ Plan:   37 year old female with antral mass highly suspicious for gastric adenocarcinoma per biopsies from [facility]. 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   - Continue full liquid diet as she has partial gastric outlet obstruction  -Continue with TPN to increase nutrition  -Consulted Pain Resource team for suggestions today - recommend increasing Dilaudid  PCA to 0.4mg  every 8 minutes, morphine  sulphate-MS Contin  30 BID, tylenol  650 q4-q6h, discontinue Lortab due to nausea  -Continue Zofran  and phenergan  for nausea prn.    -benadryl  at 50 mg, helps patient sleep, though she is anxious when awake. States the ativan  does not help but she does not want it increased as I offered her this option. States she just wants to work through the anxiety on her own.   -Will ask chaplain to see patient today, as this seems helpful to patient.  -Will discuss with staff workup for arm pain.     Michele Bates, PA-C 07/14/2012, 12:07 PM    I saw and examined this patient with the mid-level provider above.  Please see the mid-level's note, which I have carefully reviewed, for full details. I agree with the findings and plan of care as documented in the mid-level's note.  Any additions/exceptions are edited/noted.    I had a very long conversation with the patient and family today regarding her cancer diagnosis, my concerns for depression given weepy/giddy fluctuations, and plans for therapy going forward.  The conversation lasted nearly 45 minutes.  We discussed gastric cancer, the surgical management of distal gastrectomy with lymphadenectomy, and the need for post-operative chemoradiation.  We also discussed operative complications in detail, such as anastomotic leak, infection, bleeding, lymph leak, etc.    On exam today, slightly less pain although difficult to tell as she gets very weepy when discussing her pain.  Abdomen benign.  Plan for OR next  Wednesday.  The reasoning for delay is explained in my note from 07/13/2012.    Dale Artist Gearing, MD 07/14/2012 3:11 PM  Assistant Professor, Division of Surgical Oncology  [facility] Department of Surgery

## 2012-07-14 NOTE — Nurses Notes (Signed)
Pt requesting something for sleep, pt with prn order for 75mg  of benadryl, paged Dr. Lance Bosch to verify order, order changed to 50mg  benadryl. Will medicate and continue to monitor.

## 2012-07-14 NOTE — Nurses Notes (Signed)
Pt c/o pain in her left wrist. Pt states that she has told the doctors that she has had pain in her wrist and no one has done anything about it. Text paged Dr. Marsa Aris with pt's complaints, awaiting any orders. Will continue to monitor.

## 2012-07-14 NOTE — Ancillary Notes (Addendum)
07/14/12 1425   Clinical Encounter Type   Reason for Visit Patient/Person/Family Request   Referral From Nurse   Declined Spiritual Care Visit At This Time No   Visited With Patient;Other (Comment) ;Sister;Father  (2x niece, 1x nephew)   Patient Spiritual Encounters   Spiritual Assessment Anxiety/Fear;Helplessness/Lack of Coping;Using Prayer and Faith for Support   Interventions Clarified values/goals/decisions;Encouraged focus on the present;Explored/supported faith and beliefs;Explored emotions;Facilitated story telling;Offered empathy;Prayed;Provided supportive presence   Outcomes Patient shared/processed his/her own story;Spiritual needs identified and ways of addressing them identified;Patient processed emotions;Patient connected to spiritual support;Patient more peaceful   Patient's Goals/Hopes to have successful surgery next Wednesday, to be strengthened by her faith for what is to come   Spiritual Issues for Future Visits continuing to draw strength through her supports   Other Support for Patient extensive family support   How far is the patient from home?  3.5 hours   Family Spiritual Encounters   Spiritual Assessment Using Prayer and Faith for Support;Able to find meaning/acceptance   Interventions Encouraged self care;Prayed;Provided supportive presence   Outcomes Spiritual Care relationship established;Family connected to spiritual support   How has patient's hospitalization affected family? family continues to travel to visit the patient   Family's Goals/ Hopes successful surgery and cancer treatment   Family Coping through faith and family support   [facility]  Spiritual Care Note    Patient Name:  Michele Mason  Date of Encounter:   07/14/2012     Other Pertinent Information: Patient shared her fear of her impending surgery next Wednesday to remove part of her stomach, but she also hopes that this will allow for her to be able to eat and regain some strength and nourishment. She continues to rely on faith and family support as a source of strength, and was thankful for continued chaplain support. She spoke of the fear of her uncertain future, and a feeling of death being around her that keeps her awake at night. Her family encouraged her to continue to fight against those feelings, and I affirmed her for her bravery in seeking out treatment. Patient and family accepted prayer well. Patient requested prayer before her surgery Wednesday, and possible support on Sunday or Monday when her family may be absent. Spiritual Care will follow.    Benedict Needy Ankrom, CR  Pager: [phone]  Total Time of Encounter: 30 min.  I have reviewed and cosigned this note as written.  Rev. Derl Barrow, Th.M., Clinical Pastoral Education Supervisor

## 2012-07-15 ENCOUNTER — Inpatient Hospital Stay (HOSPITAL_COMMUNITY): Payer: MEDICAID

## 2012-07-15 ENCOUNTER — Encounter (HOSPITAL_COMMUNITY): Payer: Self-pay | Admitting: Surgery

## 2012-07-15 LAB — PERFORM POC WHOLE BLOOD GLUCOSE
GLUCOSE, POINT OF CARE: 111 mg/dL — ABNORMAL HIGH (ref 70–105)
GLUCOSE, POINT OF CARE: 149 mg/dL — ABNORMAL HIGH (ref 70–105)

## 2012-07-15 LAB — BASIC METABOLIC PANEL, FASTING
ANION GAP: 4 mmol/L — ABNORMAL LOW (ref 5–16)
BUN/CREAT RATIO: 20 (ref 6–22)
BUN: 13 mg/dL (ref 6–20)
CALCIUM: 8.6 mg/dL (ref 8.5–10.4)
CARBON DIOXIDE: 26 mmol/L (ref 22–32)
CHLORIDE: 104 mmol/L (ref 96–111)
CREATININE: 0.65 mg/dL (ref 0.49–1.10)
ESTIMATED GLOMERULAR FILTRATION RATE: >59 ml/min/1.73m2 (ref >59)
GLUCOSE, FASTING: 138 mg/dL — ABNORMAL HIGH (ref 70–105)
POTASSIUM: 4.3 mmol/L (ref 3.5–5.1)
SODIUM: 134 mmol/L — ABNORMAL LOW (ref 136–145)

## 2012-07-15 LAB — CBC
HCT: 26.5 % — ABNORMAL LOW (ref 33.5–45.2)
HCT: 26.2 % — ABNORMAL LOW (ref 33.5–45.2)
HGB: 8.6 g/dL — ABNORMAL LOW (ref 11.2–15.2)
HGB: 8.1 g/dL — ABNORMAL LOW (ref 11.2–15.2)
MCH: 28 pg (ref 27.4–33.0)
MCH: 27.9 pg (ref 27.4–33.0)
MCHC: 32.5 g/dL (ref 32.5–35.8)
MCHC: 30.8 g/dL — ABNORMAL LOW (ref 32.5–35.8)
MCV: 86.2 fL (ref 78–100)
MCV: 90.6 fL (ref 78–100)
MPV: 9.1 fL (ref 7.5–11.5)
MPV: 8.9 fL (ref 7.5–11.5)
PLATELET COUNT: 121 10*3/uL — ABNORMAL LOW (ref 140–450)
PLATELET COUNT: 109 10*3/uL — ABNORMAL LOW (ref 140–450)
RBC: 3.08 MIL/uL — ABNORMAL LOW (ref 3.63–4.92)
RBC: 2.89 MIL/uL — ABNORMAL LOW (ref 3.63–4.92)
RDW: 15.3 % — ABNORMAL HIGH (ref 12.0–15.0)
RDW: 15.7 % — ABNORMAL HIGH (ref 12.0–15.0)
WBC: 8 THOU/uL (ref 3.5–11.0)
WBC: 8.1 10*3/uL (ref 3.5–11.0)

## 2012-07-15 LAB — PTT (PARTIAL THROMBOPLASTIN TIME): APTT: 23.1 s (ref 22.0–32.0)

## 2012-07-15 MED ORDER — HEPARIN (PORCINE) 25,000 UNIT/250 ML (100 UNIT/ML) IN DEXTROSE 5 % IV
1200.0000 [IU]/h | INTRAVENOUS | Status: DC
Start: 2012-07-15 — End: 2012-07-15
  Filled 2012-07-15: qty 25000

## 2012-07-15 MED ORDER — HEPARIN (PORCINE) 5,000 UNITS/ML BOLUS
5000.0000 [IU] | Freq: Once | INTRAMUSCULAR | Status: DC
Start: 2012-07-15 — End: 2012-07-15

## 2012-07-15 MED ORDER — ENOXAPARIN 40 MG/0.4 ML SUBCUTANEOUS SYRINGE
40.0000 mg | INJECTION | SUBCUTANEOUS | Status: DC
Start: 2012-07-16 — End: 2012-07-19
  Administered 2012-07-16 – 2012-07-18 (×3): 40 mg via SUBCUTANEOUS
  Administered 2012-07-19: 0 mg via SUBCUTANEOUS
  Filled 2012-07-15 (×5): qty 0.4

## 2012-07-15 MED ADMIN — LORazepam 0.5 mg tablet: 0.5 mg | ORAL | NDC 51079041701

## 2012-07-15 MED ADMIN — heparin lock flush (porcine) 10 unit/mL intravenous solution: 0

## 2012-07-15 MED ADMIN — sodium chloride 0.9 % (flush) injection syringe: 0 mL

## 2012-07-15 MED ADMIN — docusate sodium 100 mg capsule: 100 mg | ORAL | NDC 00904788980

## 2012-07-15 MED ADMIN — iopamidoL 76 % intravenous solution: 70 mL | INTRAVENOUS | NDC 00270131601

## 2012-07-15 MED ADMIN — sodium chloride 0.9 % (flush) injection syringe: 20 mL | NDC 08290306547

## 2012-07-15 MED ADMIN — HYDROmorphone (PF) 1 mg/mL injection solution: 0.2 mg | INTRAVENOUS | NDC 00409255201

## 2012-07-15 MED FILL — parenteral amino acid 10 % combination no.6 intravenous solution: INTRAVENOUS | Qty: 950 | Status: AC

## 2012-07-15 NOTE — Nurses Notes (Signed)
Called to patients bedside to look at her PICC dressing by patient. Patient states she just noticed a lot of blood under her dressing. Moderate amount of blood "pooled" under dressing at this time. Notified STAT nurse Barkley Bruns. She gave the ok for me to change the dressing. Patients dressing changed at this time. She tolerated well. PICC flushing easily with no leaking. Moderate amount of blood cleansed from insertion site with clot already formed. No new bleeding to site at this time. Will continue to monitor.

## 2012-07-15 NOTE — Nurses Notes (Signed)
Patient with reports of increased pain in left upper quadrant under breast.  Patient states it is more "uncomfortable than pain" ,also reporting shortness of breath.  Patient ambulatory in room, VSS, and oxygen saturations 100% on room air.  Notified Dr. Ezequiel Essex via page.

## 2012-07-15 NOTE — Nurses Notes (Signed)
Received call back from Dr. Ezequiel Essex and order to give patient bolus dose of Dilaudid obtained.  Patient given 0.2mg  IV per order.  Will continue to monitor.

## 2012-07-15 NOTE — Care Plan (Signed)
Problem: General Plan of Care(Adult,OB)  Goal: Plan of Care Review(Adult,OB)  The patient and/or their representative will communicate an understanding of their plan of care   Outcome: Ongoing/Progressing  Plan of care reviewed.  Patient with multiple complaints of pain in left upper quadrant.  Patient report "some relief" with additional bolus dose of dilaudid.  Pain being managed by PCA pain medication.   Patient up in chair this shift.  Patient ambulatory in hall with no assistance.  Heat wraps provided per patient request for pain relief.  TPN infusion continues.   Patient free of injury.  Discharge planning to continue throughout admission.   Will continue to monitor.

## 2012-07-15 NOTE — Progress Notes (Addendum)
Orthopaedics    S: Pt removed thumb spica last night.  Discussed leaving splint in place  O:   Filed Vitals:    07/15/12 0016 07/15/12 0045 07/15/12 0310 07/15/12 0631   BP: 124/73  106/62    Pulse: 89  99    Temp: 36.7 C (98.1 F)  37 C (98.6 F)    Resp: 17  18    Height:       Weight:    62.4 kg (137 lb 9.1 oz)   SpO2:  100%       NAD  Breathing unlabored  SILT  Finger f/e intact b/l  Pain on the 1st extensor compartment LUE    A: DeQuervain tenosynovitis LUE, CTS LUE  P: pain control  PT/OT  Keep splint in place  Will discuss possible injection in future  Continue care for primary  Felecia Jan, MD 07/15/2012, 6:36 AM  [phone]

## 2012-07-15 NOTE — Nurses Notes (Signed)
Patient with continued complaints of discomfort in left upper quadrant.  Stated "I just want it to go away".  Patient also complained of nausea and was given 4mg  of Zofran as ordered.  Notified Dr. Ezequiel Essex of continued complaints of pain via page.  Patient provided with heat wraps per request for pain.  Will continue to monitor.

## 2012-07-16 LAB — CBC
HCT: 25.5 % — ABNORMAL LOW (ref 33.5–45.2)
HGB: 8.2 g/dL — ABNORMAL LOW (ref 11.2–15.2)
MCH: 28 pg (ref 27.4–33.0)
MCHC: 32.4 g/dL — ABNORMAL LOW (ref 32.5–35.8)
MCV: 86.6 fL (ref 78–100)
MPV: 8.9 fL (ref 7.5–11.5)
PLATELET COUNT: 115 10*3/uL — ABNORMAL LOW (ref 140–450)
RBC: 2.94 MIL/uL — ABNORMAL LOW (ref 3.63–4.92)
RDW: 15.7 % — ABNORMAL HIGH (ref 12.0–15.0)
WBC: 7.9 THOU/uL (ref 3.5–11.0)

## 2012-07-16 LAB — BASIC METABOLIC PANEL, FASTING
ANION GAP: 4 mmol/L — ABNORMAL LOW (ref 5–16)
BUN/CREAT RATIO: 21 (ref 6–22)
BUN: 14 mg/dL (ref 6–20)
CALCIUM: 8.8 mg/dL (ref 8.5–10.4)
CARBON DIOXIDE: 28 mmol/L (ref 22–32)
CHLORIDE: 103 mmol/L (ref 96–111)
CREATININE: 0.67 mg/dL (ref 0.49–1.10)
ESTIMATED GLOMERULAR FILTRATION RATE: >59 ml/min/1.73m2 (ref >59)
GLUCOSE, FASTING: 144 mg/dL — ABNORMAL HIGH (ref 70–105)
POTASSIUM: 4 mmol/L (ref 3.5–5.1)
SODIUM: 135 mmol/L — ABNORMAL LOW (ref 136–145)

## 2012-07-16 LAB — PERFORM POC WHOLE BLOOD GLUCOSE
GLUCOSE, POINT OF CARE: 138 mg/dL — ABNORMAL HIGH (ref 70–105)
GLUCOSE, POINT OF CARE: 152 mg/dL — ABNORMAL HIGH (ref 70–105)
GLUCOSE, POINT OF CARE: 154 mg/dL — ABNORMAL HIGH (ref 70–105)
GLUCOSE, POINT OF CARE: 148 mg/dL — ABNORMAL HIGH (ref 70–105)

## 2012-07-16 MED ORDER — CALCIUM 200 MG (AS CALCIUM CARBONATE 500 MG) CHEWABLE TABLET
1000.0000 mg | CHEWABLE_TABLET | Freq: Once | ORAL | Status: AC
Start: 2012-07-16 — End: 2012-07-16
  Administered 2012-07-16: 1000 mg via ORAL
  Filled 2012-07-16: qty 2

## 2012-07-16 MED ADMIN — docusate sodium 100 mg capsule: 100 mg | ORAL | NDC 00904788980

## 2012-07-16 MED ADMIN — LORazepam 0.5 mg tablet: 0.5 mg | ORAL | NDC 51079041701

## 2012-07-16 MED ADMIN — heparin lock flush (porcine) 10 unit/mL intravenous solution: 0

## 2012-07-16 MED ADMIN — LORazepam 0.5 mg tablet: 0 mg | ORAL

## 2012-07-16 MED ADMIN — sodium chloride 0.9 % (flush) injection syringe: 0 mL

## 2012-07-16 MED ADMIN — sodium chloride 0.9 % (flush) injection syringe: 10 mL | NDC 08290306547

## 2012-07-16 MED FILL — parenteral amino acid 10 % combination no.6 intravenous solution: INTRAVENOUS | Qty: 950 | Status: AC

## 2012-07-16 NOTE — Ancillary Notes (Addendum)
 07/16/12 1225   Clinical Encounter Type   Reason for Visit General Spiritual Care Visit   Referral From Nurse   Declined Spiritual Care Visit At This Time No   Visited With Patient   Patient Spiritual Encounters   Spiritual Assessment Anxiety/Fear;Anger;Using Prayer and Faith for Support   Interventions Clarified values/goals/decisions;Explored emotions;Facilitated story telling;Offered empathy;Provided supportive presence   Outcomes Patient shared/processed his/her own story;Patient processed emotions;Patient connected to spiritual support   Patient's Goals/Hopes to focus on her surgery and cancer treatment, not be concerned with other things right now   Other Support for Patient extensive family   [place]   [facility]  Spiritual Care Note    Patient Name:  Michele Mason  Date of Encounter:   07/16/2012    Other Pertinent Information: Patient spoke about focusing on her cancer treatment and surgery, not wanting to be concerned with other things like the staff taking attitude with her. She feels like she should be allowed to have an attitude sometimes with the stress she is dealing with, but stated, I'm leaving all the little things completely in God's hands right now and focusing myself on the cancer treatment. Visit was interrupted by a call and follow up attempt was while the patient was taking a nap. Patient requested follow up after dinner if possible. Spiritual Care will continue to follow.    Prentice Remak Ankrom, CR  Pager: [phone]  Total Time of Encounter: 10 min.  I have reviewed and cosigned this note as written.  Rev. Ronal HILARIO Duty, Th.M., Clinical Pastoral Education Supervisor

## 2012-07-16 NOTE — Nurses Notes (Signed)
Patient complaining of being constipated and requesting Miralax. Dr. Georgianne Fick notified. Awaiting orders will continue to monitor patient.

## 2012-07-16 NOTE — Care Plan (Signed)
Problem: General Plan of Care(Adult,OB)  Goal: Plan of Care Review(Adult,OB)  The patient and/or their representative will communicate an understanding of their plan of care   Outcome: Ongoing (see interventions/notes)  Patient spoke about focusing on her cancer treatment and surgery, not wanting to be concerned with other things like the staff taking attitude with her. She feels like she should be allowed to have an attitude sometimes with the stress she is dealing with, but stated, "I'm leaving all the little things completely in God's hands right now and focusing myself on the cancer treatment." Visit was interrupted by a call and follow up attempt was while the patient was taking a nap. Patient requested follow up after dinner if possible. Spiritual Care will continue to follow.

## 2012-07-16 NOTE — Nurses Notes (Signed)
FS 152, no orders for insulin administration available. Service paged.

## 2012-07-16 NOTE — Progress Notes (Addendum)
Orthopaedics    S: Pt states pain is controlled when thumb spica splint in place.  O:   Filed Vitals:    07/15/12 2350 07/16/12 0050 07/16/12 0425 07/16/12 0625   BP: 122/71  121/61    Pulse: 103  105    Temp: 36.7 C (98 F)  36.9 C (98.4 F)    Resp: 16  18    Height:       Weight: 62.6 kg (138 lb 0.1 oz)   62.8 kg (138 lb 7.2 oz)   SpO2:  100%       NAD  Breathing unlabored  SILT  Finger f/e intact b/l  Pain on the 1st extensor compartment LUE    A: DeQuervain tenosynovitis LUE, CTS LUE  P: pain control  PT/OT  Keep splint in place  Will discuss possible injection tomorrow if pain continues  Continue care for primary    Verlin Grills, MD  Resident, Pager # [phone]  [place] [facility]  Bari Edward, MD 07/16/2012, 8:05 AM

## 2012-07-17 LAB — BASIC METABOLIC PANEL, FASTING
ANION GAP: 5 mmol/L (ref 5–16)
BUN/CREAT RATIO: 25 — ABNORMAL HIGH (ref 6–22)
BUN: 14 mg/dL (ref 6–20)
CALCIUM: 8.7 mg/dL (ref 8.5–10.4)
CARBON DIOXIDE: 29 mmol/L (ref 22–32)
CHLORIDE: 102 mmol/L (ref 96–111)
CREATININE: 0.57 mg/dL (ref 0.49–1.10)
ESTIMATED GLOMERULAR FILTRATION RATE: >59 ml/min/1.73m2 (ref >59)
GLUCOSE, FASTING: 143 mg/dL — ABNORMAL HIGH (ref 70–105)
POTASSIUM: 4.1 mmol/L (ref 3.5–5.1)
SODIUM: 136 mmol/L (ref 136–145)

## 2012-07-17 LAB — CBC
HCT: 26.1 % — ABNORMAL LOW (ref 33.5–45.2)
HGB: 8.4 g/dL — ABNORMAL LOW (ref 11.2–15.2)
MCH: 28.1 pg (ref 27.4–33.0)
MCHC: 32.2 g/dL — ABNORMAL LOW (ref 32.5–35.8)
MCV: 87.5 fL (ref 78–100)
MPV: 9 fL (ref 7.5–11.5)
PLATELET COUNT: 132 THOU/uL — ABNORMAL LOW (ref 140–450)
RBC: 2.98 MIL/uL — ABNORMAL LOW (ref 3.63–4.92)
RDW: 15.6 % — ABNORMAL HIGH (ref 12.0–15.0)
WBC: 7.1 THOU/uL (ref 3.5–11.0)

## 2012-07-17 LAB — ALBUMIN: ALBUMIN: 2.7 g/dL — ABNORMAL LOW (ref 3.5–4.8)

## 2012-07-17 LAB — PERFORM POC WHOLE BLOOD GLUCOSE
GLUCOSE, POINT OF CARE: 127 mg/dL — ABNORMAL HIGH (ref 70–105)
GLUCOSE, POINT OF CARE: 124 mg/dL — ABNORMAL HIGH (ref 70–105)
GLUCOSE, POINT OF CARE: 155 mg/dL — ABNORMAL HIGH (ref 70–105)
GLUCOSE, POINT OF CARE: 121 mg/dL — ABNORMAL HIGH (ref 70–105)

## 2012-07-17 LAB — PREALBUMIN: PREALBUMIN: 16.7 mg/dL — ABNORMAL LOW (ref 18–40)

## 2012-07-17 MED ORDER — POLYETHYLENE GLYCOL 3350 17 GRAM ORAL POWDER PACKET
17.0000 g | Freq: Every day | ORAL | Status: DC | PRN
Start: 2012-07-17 — End: 2012-07-18
  Administered 2012-07-17: 17 g via ORAL
  Filled 2012-07-17 (×3): qty 1

## 2012-07-17 MED ORDER — CALCIUM 200 MG (AS CALCIUM CARBONATE 500 MG) CHEWABLE TABLET
1000.0000 mg | CHEWABLE_TABLET | Freq: Once | ORAL | Status: AC | PRN
Start: 2012-07-17 — End: 2012-07-17
  Administered 2012-07-17: 1000 mg via ORAL
  Filled 2012-07-17 (×2): qty 2

## 2012-07-17 MED ORDER — ONDANSETRON HCL (PF) 4 MG/2 ML INJECTION SOLUTION
4.0000 mg | Freq: Once | INTRAMUSCULAR | Status: DC
Start: 2012-07-17 — End: 2012-07-17

## 2012-07-17 MED ORDER — ONDANSETRON HCL (PF) 4 MG/2 ML INJECTION SOLUTION
4.0000 mg | Freq: Once | INTRAMUSCULAR | Status: AC
Start: 2012-07-17 — End: 2012-07-17
  Administered 2012-07-17: 4 mg via INTRAVENOUS

## 2012-07-17 MED ADMIN — docusate sodium 100 mg capsule: 0 mg | ORAL

## 2012-07-17 MED ADMIN — LORazepam 0.5 mg tablet: 0.5 mg | ORAL | NDC 51079041701

## 2012-07-17 MED ADMIN — docusate sodium 100 mg capsule: 100 mg | ORAL | NDC 00904788980

## 2012-07-17 MED ADMIN — sodium chloride 0.9 % (flush) injection syringe: 0 mL

## 2012-07-17 MED ADMIN — heparin lock flush (porcine) 10 unit/mL intravenous solution: 0

## 2012-07-17 MED ADMIN — ondansetron HCl (PF) 4 mg/2 mL injection solution: 4 mg | INTRAVENOUS | NDC 00641607801

## 2012-07-17 MED ADMIN — HYDROmorphone (PF) 1 mg/mL injection solution: 0.2 mg | INTRAVENOUS | NDC 00409255201

## 2012-07-17 MED FILL — ondansetron HCl (PF) 4 mg/2 mL injection solution: 4.0000 mg | INTRAMUSCULAR | Qty: 2 | Status: AC

## 2012-07-17 MED FILL — parenteral amino acid 10 % combination no.6 intravenous solution: INTRAVENOUS | Qty: 950 | Status: AC

## 2012-07-17 NOTE — Consults (Addendum)
 [place]  [facility]  Pain Resource Team  Follow Up    Michele Mason  [id number]  January 07, 1976    Date of Service:  07/17/2012  Requesting MD: Surg Onc  Chief Complaint/Reason for consult: Pain       Subjective: doing well with pain control, still nervous about upcoming surgery.   Will plan for IV pain control when NPO for surgery   Spoke to Ortho regarding L arm/wrist/thumb pain - she is unsure of diagnosis and would like more information. May receive injection.     Objective: Lying in bed resting. In no apparent distress   Temperature: 36.7 C (98.1 F)  Heart Rate: 85  BP (Non-Invasive): 108/74 mmHg  Respiratory Rate: 18  SpO2-1: 99 %    Current analgesic regimen:   Ms-Contin 30 mg BID  PCA 0.4 mg dose Q8 min lockout    Assessment/Recommendations:  - Pt is content with current regimen and reports adequate pain relief.   - Discussed increasing PCA settings when NPO for surgery- Will offer suggestion of settings tomorrow.   - Continue support from family, staff, pastoral care.   - Providing w/ patient information handouts on CTS and DeQuervains tenosynovitis       Please call [phone] with any questions or needs.     Michele Mason, [facility] 07/17/2012, 1:52 PM    I reviewed the NP's note.  I agree with the findings and plan of care as documented in the NP's note.  Any exceptions/additions are edited/noted.    Belvie Hoit, MD 07/19/2012

## 2012-07-17 NOTE — Care Plan (Signed)
Problem: General Plan of Care(Adult,OB)  Goal: Plan of Care Review(Adult,OB)  The patient and/or their representative will communicate an understanding of their plan of care   Outcome: Ongoing (see interventions/notes)  Patient is both fearful and hopeful regarding her cancer. She relies on her faith and her family for support. I explored her story, her faith, hopes and fears. I prayed with the patient and she expressed her gratitude. She invited me to visit again in the near future.

## 2012-07-17 NOTE — Nurses Notes (Signed)
 Patient is refusing her senekot and colace, patient educated on senekot and colace patient stated, I've been taking this since I've been here and it's not helping, it's still coming out as hard balls. Patient informed that senekot and colace is not to promote her to have a bowel movement, however, it is to help keep her stool from becoming hard. Patient stated, it can't get any harder and continues to refuse medication at this time. Will continue to monitor patient.

## 2012-07-17 NOTE — Progress Notes (Addendum)
[facility]                                                  SURG-ONCOLOGY PROGRESS NOTE    Michele Mason, Michele Mason, 37 y.o. female  Date of Admission:  07/09/2012  Date of Birth:  Nov 14, 1975  Date of Service:  07/17/2012    Post Op Day: 6 Days Post-Op S/P Procedure(s) (LRB):  GASTROSCOPY (N/A)  ENDOSCOPIC U/S UPPER (N/A)  GASTROSCOPY WITH BIOPSY (N/A)  Chief Complaint: Gastric Adenocarcinoma  Subjective: Patient states that she is "ok". She complains of continued epigastric pain, left thumb pain, and nervousness about impending surgery. Patient states that she is looking for to having the surgery done but is worried about any complications that could come from it. Denies chest pain, SOB, nausea (uncontrolled by Zofran and Phenergan), or emesis.     Vital Signs:  Temp (24hrs) Max:37.3 C (99.1 F)      Systolic (24hrs), Avg:111 mmHg, Min:95 mmHg, Max:137 mmHg    Diastolic (24hrs), Avg:69 mmHg, Min:60 mmHg, Max:87 mmHg    Temp  Avg: 37 C (98.6 F)  Min: 36.8 C (98.2 F)  Max: 37.3 C (99.1 F)  Pulse  Avg: 104.1  Min: 93  Max: 139  Resp  Avg: 18.1  Min: 18  Max: 19  SpO2  Avg: 99 %  Min: 98 %  Max: 100 %  Pain Score (Numeric, Faces): 10    Current Medications:    Current Facility-Administered Medications:  acetaminophen (TYLENOL) tablet 650 mg Oral Q4H PRN   adult custom parenteral nutrition  Intravenous Continuous   benzocaine 7.5% oral gel  Mucous Membrane 6x/day PRN   diphenhydrAMINE (BENADRYL) 50 mg/mL injection 50 mg Intravenous Q6H PRN   docusate sodium (COLACE) capsule 100 mg Oral 2x/day   enoxaparin (LOVENOX) 40 mg/0.4 mL SubQ injection 40 mg Subcutaneous Q24H   esomeprazole (NEXIUM) injection 40 mg Intravenous Daily   heparin flush (HEPFLUSH) 10 units/mL injection 2-6 mL Intracatheter Q8HRS   heparin flush (HEPFLUSH) 10 units/mL injection 2 mL Intracatheter Q1 MIN PRN   HYDROmorphone (DILAUDID) 1 mg/mL (tot vol 100 mL) in NS PCA 1 Bag Intravenous Q1H PRN    HYDROmorphone (DILAUDID) 1 mg/mL injection 0.2 mg Intravenous Q2H PRN   lorazepam (ATIVAN) tablet 0.5 mg Oral 4x/day   LR premix infusion  Intravenous Continuous   LR premix infusion  Intravenous Continuous   morphine (MS CONTIN) extended release tablet 30 mg Oral Q12H   NS flush syringe 10-30 mL Intracatheter Q8HRS   NS flush syringe 20-30 mL Intracatheter Q1 MIN PRN   ondansetron (ZOFRAN) 2 mg/mL injection 4 mg Intravenous Q8H PRN   polyethylene glycol (MIRALAX) oral packet 17 g Oral Daily PRN   promethazine (PHENERGAN) 25 mg/mL injection 25 mg Intramuscular Q6H PRN   sennosides-docusate sodium (SENOKOT-S) 8.6-50mg  per tablet 1 Tab Oral 2x/day       Today's Physical Exam:  General: appears in good health and no distress  Lungs: Clear to auscultation bilaterally.   Cardiovascular: regular rate and rhythm  Abdomen: Bowel sounds normal, non-distended, soft, mildly tender to palpation  Neurologic: Grossly normal, Alert and oriented x3  Psychiatric: Anxious  Extremities: Left Thumb spica in place     I/O:  I/O last 24 hours:    Intake/Output Summary (Last 24 hours) at 07/17/12 1610  Last data filed  at 07/17/12 0700   Gross per 24 hour   Intake   3738 ml   Output      0 ml   Net   3738 ml     I/O current shift:       Prophylaxis:  Date Started Date Completed   DVT/PE  Lovenox and SCDs/ Venodynes     GI Colace, Nexium, Tums         Nutrition/Residuals:  Full Liquid    Labs    Reviewed:   Recent Labs      07/15/12   0049  07/15/12   1039  07/16/12   0100  07/17/12   0137   SODIUM  134*   --   135*  136   POTASSIUM  4.3   --   4.0  4.1   CHLORIDE  104   --   103  102   BUN  13   --   14  14   CREATININE  0.65   --   0.67  0.57   ANIONGAP  4*   --   4*  5   CALCIUM  8.6   --   8.8  8.7   WBC  8.0  8.1  7.9  7.1   HGB  8.6*  8.1*  8.2*  8.4*   HCT  26.5*  26.2*  25.5*  26.1*   PLTCNT  121*  109*  115*  132*       Recent Labs      07/17/12   0137   ALBUMIN  2.7*   PREALBUMIN  16.7*     Ordered:  None    Diagnostic Tests    Reviewed: N/A  Ordered:  None    Radiology Tests   Reviewed: N/A  Ordered:  None    Assessment/ Plan:   Active Hospital Problems   (*Primary Problem)    Diagnosis   . *Gastric cancer   . Moderate protein-calorie malnutrition   . Chronic pain disorder   . Sarcoidosis     Renewed TPN and will continue Full Liquid Diet for 1 more day then switch to Clear Liquid Diet in contemplation on Wednesday surgery  Ordered Miralax for bowel aid  Ordered Incentive Spirometer and patient will be taught proper usage and importance once it has been received  Patient will be made NPO at midnight on 07/19/12  Patient consented for distal gastrectomy, lymph node biopsy, and any associated surgeries and placed in chart    Nsisong Sheran Fava, MD 07/17/2012, 8:34 AM    I saw and examined this patient with the resident above.  Please see the resident's note, which I have carefully reviewed, for full details. I agree with the findings and plan of care as documented in the resident's note.  Any additions/exceptions are edited/noted.    The weekend seems to have provided her some much needed sleep and emotional relief.  She was in much better spirits today.  On exam, abdomen benign.  L wrist in SPICA splint.  Appreciate ortho assistance.  Plan for distal gastrectomy with D2 lymphadenectomy on Wednesday 2/5.  Her pre-albumin has increased nicely to 16.7 today with a week of TPN.    [name], MD 07/17/2012 5:03 PM  Assistant Professor, Division of Surgical Oncology  [facility] Department of Surgery

## 2012-07-17 NOTE — Ancillary Notes (Addendum)
 07/17/12 1050   Clinical Encounter Type   Reason for Visit General Spiritual Care Visit   Referral From Chaplain Prentice Martes Spiritual Care Visit At This Time Yes   Visited With Patient   [place]  [facility]  Spiritual Care Note    Patient Name:  Michele Mason  Date of Encounter:   07/17/2012    Other Pertinent Information: Patient stated that she had just taken some sleepy medicine and set her alarm. She declined a visit at this time but inviting me to try again later. I will offer pastoral care again later this day or week.     Fairy KANDICE Pizza, CR  Pager: [phone]  Total Time of Encounter: 2 min.  I have reviewed and cosigned this note as written.  Rev. Ronal HILARIO Duty, Th.M., Clinical Pastoral Education Supervisor

## 2012-07-17 NOTE — Care Plan (Signed)
Problem: General Plan of Care(Adult,OB)  Goal: Plan of Care Review(Adult,OB)  The patient and/or their representative will communicate an understanding of their plan of care   Outcome: Ongoing (see interventions/notes)  Plan of care reviewed with patient. Patient remains on TPN and Dilaudid PCA controlling her pain. Patient complaining of intermittent nausea receiving IV Zofran and IM Phenergan last night. Patient ambulates independently, maintains oxygen saturations on room air. Received a one time dose of Tums last night for "upset stomach", patient complains of constipation, however had a bowel movement last night and remains on stool softeners. Patient education and discharge planning remain in progress.

## 2012-07-17 NOTE — Ancillary Notes (Addendum)
 07/16/12 2323   Clinical Encounter Type   Reason for Visit General Spiritual Care Visit   Referral From Chaplain Initiated Follow Up   Declined Spiritual Care Visit At This Time No   Visited With Patient   Patient Spiritual Encounters   Spiritual Assessment Able to find meaning/acceptance;Anxiety/Fear;Using Prayer and Faith for Support   Interventions Encouraged self care;Explored/supported faith and beliefs;Explored emotions;Offered empathy;Provided supportive presence   Outcomes Patient processed emotions;Patient connected to spiritual support;Patient more peaceful   Patient's Goals/Hopes to have successful surgery on Wednesday   Spiritual Issues for Future Visits continued faith support   [place]  [facility]  Spiritual Care Note    Patient Name:  Michele Mason  Date of Encounter:   07/17/2012    Other Pertinent Information: Patient spoke briefly of the faith support and prayer she has been getting from her friends and family. She feels moved by the number of people praying for her and says it eases her anxiety and allows her to fight the fear of surgery. She spoke of mending relationships with the nursing staff, though there was still some latent frustration from the insertion of her IV line. She was thankful for positive communication with the staff later in the day, and is grateful for continued support. Follow up requested prior to surgery if not before. Spiritual Care will continue to monitor this patient.    Prentice Remak Ankrom, CR  Pager: [phone]  Total Time of Encounter: 13 min.  I have reviewed and cosigned this note as written.  Rev. Ronal HILARIO Duty, Th.M., Clinical Pastoral Education Supervisor

## 2012-07-17 NOTE — Ancillary Notes (Addendum)
07/17/12 1420   Clinical Encounter Type   Reason for Visit General Spiritual Care Visit   Referral From follow-up   Declined Spiritual Care Visit At This Time No   Visited With Patient   Patient Spiritual Encounters   Spiritual Assessment Anxiety/Fear;Hopeful;Using Prayer and Faith for Support   Interventions Clarified values/goals/decisions;Explored/supported faith and beliefs;Explored emotions;Facilitated story telling;Offered empathy;Prayed;Provided supportive presence   Outcomes Spiritual Care relationship established;Patient shared/processed his/her own story;Patient states, " I feel better";Patient identified values/goals   Patient's Goals/Hopes Wants productive surgery   Other Support for Patient Family   How far is the patient from home?  3.5 hours   Values/Beliefs/Spiritual Care   (F) Faith: Importance of Culture, Spirituality, Religion In Life Patient states she is a [employer]) [place] and finds comfort in prayer. She states taht she is turning her concerns over to God and trusts that he will get her through this difficult time. Patient is disappointed in herself for experiencing doubts and for questioning God.    Mutuality/Individual Preferences   !!What Anxieties, Fears or Concerns Do You Have About Your Health or Care? Fear that the cancer could be in her lymph nodes and blood stream.    Coping/Psychosocial Response Assessments   Observed Emotional State afraid/fearful;calm;tearful/crying;hopeful   Coping/Psychosocial Response Interventions   Plan of Care Reviewed With patient   [facility]  Spiritual Care Note    Patient Name:  Michele Mason  Date of Encounter:   07/17/2012    Other Pertinent Information: Patient is both fearful and hopeful regarding her cancer. She relies on her faith and her family for support. I explored her story, her faith, hopes and fears. I  prayed with the patient and she expressed her gratitude. She invited me to visit again in the near future.        Kara Pacer, CR  Pager: [phone]  Total Time of Encounter: 45 min.  I have reviewed and cosigned this note as written.  Rev. Derl Barrow, Th.M., Clinical Pastoral Education Supervisor

## 2012-07-17 NOTE — Consults (Signed)
[facility]                                   DEPARTMENT OF ORTHOPAEDICS                                     CONSULTATION REPORT       PATIENT NAME: Michele Mason, Michele Mason  HOSPITAL XBJYNW:[id number]  DATE OF SERVICE:07/17/2012  DATE OF BIRTH: 1975-10-31    SUBJECTIVE:  I saw Ms. Corrinne Eagle today, on July 17, 2012.  My residents have been seeing her for several days.  After consulting with me by phone over the weekend, I diagnosed her with de Quervain syndrome of her left wrist.  They placed her in a thumb spica splint.  Since that time, she has improved, but whenever she takes off the splint, it still hurts.  I went by today.  Ms. Zollner situation is a little complicated because she is admitted for a gastric cancer.  The wrist has been bothering her the whole time she is here though, and she asked to have it seen while she was here in house.  This is a minor problem for her overall but a major one as far as the physical impediment.  Ms. Inthavong describes the problem as starting about 5 months ago when her family started a restaurant.  When she started carrying heavy plates, she began to have pain in her wrist.      OBJECTIVE:  On my physical exam, she is tender over the first dorsal compartment.  She has a positive Finkelstein test.      ASSESSMENT:  She has a classic case of de Quervain tenosynovitis.    PLAN:  If we get information from her admitting service and there is no problem from their standpoint, I would like to offer Ms. Tangen a steroid injection into her first dorsal compartment along with continuation of her wrist splint.  She wants to try it.  I explained the procedure to her.  We will talk to her admitting team and if they give their approval, we will try and do that for her some time tomorrow.      Gypsy Decant, MD  [facility] Chair of Orthopedic Surgery  Associate Professor  [place] Department of Orthopaedics     JP/[id number];D: 07/17/2012 18:15:38; T: 07/17/2012 65:78:46

## 2012-07-18 LAB — BASIC METABOLIC PANEL, FASTING
ANION GAP: 4 mmol/L — ABNORMAL LOW (ref 5–16)
BUN/CREAT RATIO: 19 (ref 6–22)
BUN: 13 mg/dL (ref 6–20)
CALCIUM: 8.6 mg/dL (ref 8.5–10.4)
CARBON DIOXIDE: 27 mmol/L (ref 22–32)
CHLORIDE: 104 mmol/L (ref 96–111)
CREATININE: 0.67 mg/dL (ref 0.49–1.10)
ESTIMATED GLOMERULAR FILTRATION RATE: >59 mL/min/{1.73_m2} (ref >59)
GLUCOSE, FASTING: 180 mg/dL — ABNORMAL HIGH (ref 70–105)
POTASSIUM: 4.2 mmol/L (ref 3.5–5.1)
SODIUM: 135 mmol/L — ABNORMAL LOW (ref 136–145)

## 2012-07-18 LAB — CBC
HCT: 24.8 % — ABNORMAL LOW (ref 33.5–45.2)
HGB: 7.9 g/dL — ABNORMAL LOW (ref 11.2–15.2)
MCH: 27.9 pg (ref 27.4–33.0)
MCHC: 31.9 g/dL — ABNORMAL LOW (ref 32.5–35.8)
MCV: 87.4 fL (ref 78–100)
MPV: 9 fL (ref 7.5–11.5)
PLATELET COUNT: 128 10*3/uL — ABNORMAL LOW (ref 140–450)
RBC: 2.84 MIL/uL — ABNORMAL LOW (ref 3.63–4.92)
RDW: 15.7 % — ABNORMAL HIGH (ref 12.0–15.0)
WBC: 5.6 10*3/uL (ref 3.5–11.0)

## 2012-07-18 LAB — PHOSPHORUS: PHOSPHORUS: 2.4 mg/dL (ref 2.4–4.7)

## 2012-07-18 LAB — PERFORM POC WHOLE BLOOD GLUCOSE
GLUCOSE, POINT OF CARE: 136 mg/dL — ABNORMAL HIGH (ref 70–105)
GLUCOSE, POINT OF CARE: 120 mg/dL — ABNORMAL HIGH (ref 70–105)
GLUCOSE, POINT OF CARE: 130 mg/dL — ABNORMAL HIGH (ref 70–105)
GLUCOSE, POINT OF CARE: 111 mg/dL — ABNORMAL HIGH (ref 70–105)

## 2012-07-18 LAB — MAGNESIUM: MAGNESIUM: 1.9 mg/dL (ref 1.7–2.5)

## 2012-07-18 MED ORDER — DEXTROSE 5 % IN WATER (D5W) INTRAVENOUS SOLUTION
2.0000 g | Freq: Once | INTRAVENOUS | Status: DC
Start: 2012-07-18 — End: 2012-07-18

## 2012-07-18 MED ORDER — LACTATED RINGERS INTRAVENOUS SOLUTION
INTRAVENOUS | Status: DC
Start: 2012-07-19 — End: 2012-07-19

## 2012-07-18 MED ORDER — DEXTROSE 5 % IN WATER (D5W) INTRAVENOUS SOLUTION
2.0000 g | Freq: Once | INTRAVENOUS | Status: DC
Start: 2012-07-19 — End: 2012-07-19
  Filled 2012-07-18 (×2): qty 20

## 2012-07-18 MED ORDER — DEXTROSE 5 % IN WATER (D5W) INTRAVENOUS SOLUTION
2.0000 g | Freq: Once | INTRAVENOUS | Status: DC
Start: 2012-07-19 — End: 2012-07-19
  Filled 2012-07-18 (×3): qty 20

## 2012-07-18 MED ADMIN — HYDROmorphone (PF) 1 mg/mL injection solution: 0.2 mg | INTRAVENOUS | NDC 00409255201

## 2012-07-18 MED ADMIN — heparin lock flush (porcine) 10 unit/mL intravenous solution: 0

## 2012-07-18 MED ADMIN — sodium chloride 0.9 % (flush) injection syringe: 0 mL

## 2012-07-18 MED ADMIN — LORazepam 0.5 mg tablet: 0.5 mg | ORAL | NDC 51079041701

## 2012-07-18 MED ADMIN — polyethylene glycol 3350 17 gram oral powder packet: 17 g | ORAL | NDC 51079030601

## 2012-07-18 MED ADMIN — ondansetron HCl (PF) 4 mg/2 mL injection solution: 4 mg | INTRAVENOUS | NDC 00641607801

## 2012-07-18 MED ADMIN — docusate sodium 100 mg capsule: 100 mg | ORAL | NDC 00904788980

## 2012-07-18 MED FILL — polyethylene glycol 3350 17 gram oral powder packet: 17.0000 g | ORAL | Qty: 1 | Status: AC

## 2012-07-18 MED FILL — parenteral amino acid 10 % combination no.6 intravenous solution: INTRAVENOUS | Qty: 950 | Status: AC

## 2012-07-18 NOTE — Anesthesia Preprocedure Evaluation (Addendum)
Airway       Mallampati: I    TM distance: >3 FB    Neck ROM: full  Mouth Opening: good.  No Facial hair    No endotracheal tube present  No Tracheostomy present    Dental       Dentition intact             Pulmonary    Breath sounds clear to auscultation  (-) no rhonchi, no decreased breath sounds, no wheezes, no rales and no stridor     Cardiovascular    Rhythm: regular  Rate: Normal  (-) no friction rub, carotid bruit is not present, no peripheral edema and no murmur     Other findings  Old midline abdominal scar               Planned anesthesia type: general  ASA 3        Patient's NPO status is appropriate for Anesthesia.      Anesthetic plan and risks discussed with patient.    Anesthesia issues/risks discussed are: Dental Injuries, Nerve Injuries, Post-op Intubation/Ventilation, PONV, Art Line Placement, Cardiac Events/MI, Stroke, Blood Loss and Post-op Pain Management.      Use of blood products discussed with patient whom.     Plan discussed with resident and CRNA.                    Indication for surgery: gastric CA    Functional Status able to climb 2 flights of stairs without chest pain/dyspnea, some exertional fatigue  Anesthesia Hx Endoscopy in past 2 wks without perioperative complications  NPO status >8 hrs prior to surgery  Allergies NKDA    Preoperative beta-blocker none  Preoperative medications given   TPN at full strength      ECG 07/11/12 normal sinus rhythm, QTc 408 ms  CXR n/a  ECHO n/a  Labs   Hct 24.8/Plt 128  BMP WNL       The risks of general anesthesia including but not limited to perioperative MI/CVA/death, post-op intubation/ventilation, PONV, blood loss requiring transfusion, arterial line placement, post-op pain were discussed with the patient. All questions/concerns regarding general anesthesia were addressed prior to proceeding with surgery.       Plan:    Continue morphine sulphate-MS contin 30 mg PO BID in the perioperative period    Epidural anesthesia for post-operative pain control was discussed w/patient, she is unsure at this time  Plan to decrease TPN dose at time of surgery  T&S at minimum   Standard IV induction  General anesthetic w/ETT   Arterial line for hemodynamic monitoring  Establish large-bore IV access  Plan for emergence/extubation post-operatively, disposition per primary service  Plan for multimodal perioperative analgesia strategy due to cancer pain

## 2012-07-18 NOTE — Progress Notes (Addendum)
Orthopaedics    S: Pt states that pain is slightly improved from yesterday. Primary team and pt planning for surgery tomorrow.  OCeasar Mons Vitals:    07/17/12 2341 07/18/12 0015 07/18/12 0334 07/18/12 0435   BP: 122/82   99/55   Pulse: 106   85   Temp: 37.1 C (98.8 F)   37 C (98.6 F)   Resp: 18   18   Height:       Weight:       SpO2:  100% 100%      NAD  LUE: Finger f/e intact b/l  Pain on the 1st extensor compartment with palpation    A: DeQuervain tenosynovitis LUE, CTS LUE  P: pain control  PT/OT  Keep splint in place  Will discuss possible injection later today if pain continues  Continue care for primary    Verlin Grills, MD  Resident, Pager # [phone]  [place] Dept of Orthopaedics  Bari Edward, MD 07/18/2012, 7:15 AM

## 2012-07-18 NOTE — Nurses Notes (Signed)
 Patient unhooked herself from Q15 vital signs during first blood transfusion. Patient missed one cycle of blood pressure, reattached to dynamap to continue to monitor blood pressures at this time. Will continue to monitor patient.

## 2012-07-18 NOTE — Nurses Notes (Signed)
Verified patient needs to be transfused tonight prior to surgery, Dr. Georgianne Fick states she needs to be transfused tonight. Type and cross drawn from PICC. Dr. Georgianne Fick notified patient needs to be consented for transfusion.

## 2012-07-18 NOTE — CDI REVIEW (Signed)
 Admissions, Findings, and Consultations:  1/31 Surg Onc: anxiety, depression concerns  1/31 Ortho H&P: Left DeQuervains tenosynovitis and carpal tunnel syndrome  2/1 Ortho: arm pain  2/1 Surg Onc: same  2/2 Surg Onc: same  2/3 Ortho: DeQuervain tenosynovitis LUE  2/3 Surg Onc: Moderate protein-calorie malnutrition , Sarcoidosis      Diagnostics and Results:   Procedures and Treatments:   Meds, IV's, Rx Blood:   Comments:

## 2012-07-18 NOTE — Care Plan (Signed)
 Problem: General Plan of Care(Adult,OB)  Goal: Plan of Care Review(Adult,OB)  The patient and/or their representative will communicate an understanding of their plan of care   Outcome: Ongoing (see interventions/notes)  Plan of care reviewed with patient. Patient remains on Dilaudid  PCA, PRN Tylenol  for pain control. Patient is now on clear liquid diet for prep for OR tomorrow. Patient refusing stool softeners tonight, Patient education and discharge planning remain in progress.

## 2012-07-18 NOTE — Care Management Notes (Signed)
 [place]  [facility]  Care Management Note    Patient Name: Michele Mason  Date of Birth: 1975-12-01  Sex: female  Date/Time of Admission: 07/09/2012  1:37 PM  Room/Bed: 756/A  Payor: [place] MEDICAID  Plan:  MEDICAID  Product Type: Medicaid     LOS: 9 days   PCP: No Established Pcp    Admitting Diagnosis:  gastric mass  gastric tumor  gastic cancer    Assessment: Touch based rounds completed. Plans for OR tomorrow for gastrectomy RU-Y. Continues  TPN,  Patient will be on Clear Liquid Diet today, NPO at midnight  To prepare OR.Continues Miralax  for bowel aid. Will continue to follow      Discharge Plan:  Home(Patient/Family Member/other) (code 1)  Pending patient progress to discharge home with home with family. Transportation available.    The patient will continue to be evaluated for developing discharge needs.     Case Manager: Alix Pereyra, RN 07/18/2012, 11:16 AM  Phone: [phone]

## 2012-07-18 NOTE — Care Plan (Signed)
Problem: General Plan of Care(Adult,OB)  Goal: Plan of Care Review(Adult,OB)  The patient and/or their representative will communicate an understanding of their plan of care   Outcome: Ongoing (see interventions/notes)  Pending patient progress to discharge home with home with family. Transportation available.    The patient will continue to be evaluated for developing discharge needs.     Case Manager: Harriet Masson, RN 07/18/2012, 11:16 AM  Phone: [phone]

## 2012-07-19 ENCOUNTER — Encounter (HOSPITAL_COMMUNITY): Payer: Self-pay | Admitting: Certified Registered"

## 2012-07-19 ENCOUNTER — Encounter (HOSPITAL_COMMUNITY): Payer: MEDICAID | Admitting: Certified Registered"

## 2012-07-19 ENCOUNTER — Encounter (HOSPITAL_COMMUNITY)
Admission: EM | Disposition: A | Discharge status: Home / Self Care | Payer: Self-pay | Source: Other Acute Inpatient Hospital | Attending: SURGICAL ONCOLOGY

## 2012-07-19 LAB — ARTERIAL BLOOD GAS/LACTATE/CO-OX/LYTES (NA/K/CA/CL/GLUC) (TEMP COMP)
%FIO2: 73 % (ref 21–100)
BASE DEFICIT: 2 mmol/L (ref 0.0–3.0)
BASE DEFICIT: 2.9 mmol/L (ref 0.0–3.0)
BICARBONATE: 23.4 mmol/L (ref 18.0–29.0)
CO2T: 38 MM HG (ref 33.1–43.1)
CO2T: 37 MM HG (ref 33.1–43.1)
GLUCOSE: 195 mg/dL — ABNORMAL HIGH (ref 70–105)
LACTATE: 0.9 mmol/L (ref <1.3)
LACTATE: 1.4 mmol/L — ABNORMAL HIGH (ref <1.3)
MET-HEMOGLOBIN: 1.6 % (ref 0.0–3.0)
O2CT: 13.4 — ABNORMAL LOW (ref 15.7–21.6)
PH: 7.37 (ref 7.350–7.450)
PHT: 7.39 (ref 7.350–7.450)
PHT: 7.38 (ref 7.350–7.450)
PIO2/FIO2 RATIO: 206 — ABNORMAL LOW (ref >300)
PIO2/FIO2 RATIO: 562 (ref >300)
PO2: 99 mm Hg (ref 80–100)
PO2: 410 mm Hg (ref 80–100)
PO2T: 411 mm Hg (ref 80–100)
TEMPERATURE, COMP: 35.8 C (ref 15.0–40.0)
TEMPERATURE, COMP: 37.2 C (ref 15.0–40.0)

## 2012-07-19 LAB — CBC
HCT: 33.4 % — ABNORMAL LOW (ref 33.5–45.2)
HGB: 10.9 g/dL — ABNORMAL LOW (ref 11.2–15.2)
MCH: 28.5 pg (ref 27.4–33.0)
MCHC: 32.6 g/dL (ref 32.5–35.8)
MCV: 87.5 fL (ref 78–100)
MPV: 9.2 fL (ref 7.5–11.5)
PLATELET COUNT: 168 THOU/uL (ref 140–450)
RBC: 3.82 MIL/uL (ref 3.63–4.92)
RDW: 15.2 % — ABNORMAL HIGH (ref 12.0–15.0)
WBC: 7.5 10*3/uL (ref 3.5–11.0)

## 2012-07-19 LAB — BASIC METABOLIC PANEL, FASTING
ANION GAP: 5 mmol/L (ref 5–16)
BUN/CREAT RATIO: 21 (ref 6–22)
BUN: 14 mg/dL (ref 6–20)
CALCIUM: 9.2 mg/dL (ref 8.5–10.4)
CARBON DIOXIDE: 28 mmol/L (ref 22–32)
CHLORIDE: 105 mmol/L (ref 96–111)
CREATININE: 0.66 mg/dL (ref 0.49–1.10)
ESTIMATED GLOMERULAR FILTRATION RATE: >59 mL/min/{1.73_m2} (ref >59)
GLUCOSE, FASTING: 104 mg/dL (ref 70–105)
POTASSIUM: 4.6 mmol/L (ref 3.5–5.1)
SODIUM: 138 mmol/L (ref 136–145)

## 2012-07-19 LAB — ARTERIAL BLOOD GAS/LACTATE/CO-OX/LYTES (NA/K/CA/CL/GLUC) (TEMP COMP) - ORS ONLY
%FIO2: 48 % (ref 21–100)
BICARBONATE: 22.7 mmol/L (ref 18.0–29.0)
CARBOXYHEMOGLOBIN: 1.7 % (ref 0.0–2.5)
CARBOXYHEMOGLOBIN: 2 % (ref 0.0–2.5)
CHLORIDE: 108 mmol/L (ref 96–111)
CHLORIDE: 107 mmol/L (ref 96–111)
GLUCOSE: 242 mg/dL — ABNORMAL HIGH (ref 70–105)
HEMOGLOBIN: 10.1 g/dL — ABNORMAL LOW (ref 12.0–16.0)
HEMOGLOBIN: 9 g/dL — ABNORMAL LOW (ref 12.0–16.0)
IONIZED CALCIUM: 1.19 mmol/L — ABNORMAL LOW (ref 1.30–1.46)
IONIZED CALCIUM: 1.1 mmol/L — ABNORMAL LOW (ref 1.30–1.46)
MET-HEMOGLOBIN: 1.8 % (ref 0.0–3.0)
O2CT: 13.7 — ABNORMAL LOW (ref 15.7–21.6)
OXYHEMOGLOBIN: 95.7 % (ref 85.0–98.0)
OXYHEMOGLOBIN: 96.9 % (ref 85.0–98.0)
PCO2: 40 mmHg (ref 33.1–43.1)
PCO2: 37 mmHg (ref 33.1–43.1)
PH: 7.38 (ref 7.350–7.450)
PO2T: 92 mmHg (ref 80–100)
SODIUM: 135 mmol/L — ABNORMAL LOW (ref 136–145)
SODIUM: 135 mmol/L — ABNORMAL LOW (ref 136–145)
WHOLE BLOOD K+: 4.2 mmol/L (ref 3.5–5.0)
WHOLE BLOOD K+: 3.7 mmol/L (ref 3.5–5.0)

## 2012-07-19 LAB — TYPE AND CROSS RED CELLS - UNITS
ABO/RH(D): O POS
ANTIBODY SCREEN: NEGATIVE
UNITS ORDERED: 2

## 2012-07-19 LAB — PERFORM POC WHOLE BLOOD GLUCOSE: GLUCOSE, POINT OF CARE: 174 mg/dL — ABNORMAL HIGH (ref 70–105)

## 2012-07-19 SURGERY — GASTRECTOMY
Anesthesia: General | Wound class: Clean Contaminated Wounds-The respiratory, GI, Genital, or urinary

## 2012-07-19 MED ORDER — ROCURONIUM 10 MG/ML INTRAVENOUS SOLUTION
Freq: Once | INTRAVENOUS | Status: DC | PRN
Start: 2012-07-19 — End: 2012-07-19
  Administered 2012-07-19 (×2): 10 mg via INTRAVENOUS
  Administered 2012-07-19: 70 mg via INTRAVENOUS

## 2012-07-19 MED ORDER — ALBUMIN, HUMAN 5 % INTRAVENOUS SOLUTION
INTRAVENOUS | Status: DC | PRN
Start: 2012-07-19 — End: 2012-07-19

## 2012-07-19 MED ORDER — LACTATED RINGERS INTRAVENOUS SOLUTION
INTRAVENOUS | Status: DC
Start: 2012-07-19 — End: 2012-07-20

## 2012-07-19 MED ORDER — HYDROMORPHONE 1 MG/ML INJECTION WRAPPER
0.4000 mg | INJECTION | INTRAMUSCULAR | Status: DC | PRN
Start: 2012-07-19 — End: 2012-07-19
  Administered 2012-07-19: 0.2 mg via INTRAVENOUS
  Administered 2012-07-19 (×2): 0.4 mg via INTRAVENOUS
  Administered 2012-07-19: 0.2 mg via INTRAVENOUS
  Administered 2012-07-19 (×7): 0.4 mg via INTRAVENOUS
  Filled 2012-07-19 (×4): qty 1

## 2012-07-19 MED ORDER — SODIUM CHLORIDE 0.9 % INTRAVENOUS SOLUTION
1.00 g | Freq: Four times a day (QID) | INTRAVENOUS | Status: DC
Start: 2012-07-19 — End: 2012-07-20
  Administered 2012-07-19 – 2012-07-20 (×2): 1 g via INTRAVENOUS
  Filled 2012-07-19 (×4): qty 10

## 2012-07-19 MED ORDER — HYDROMORPHONE 1 MG/ML INJECTION WRAPPER
0.40 mg | INJECTION | INTRAMUSCULAR | Status: DC | PRN
Start: 2012-07-19 — End: 2012-07-19
  Administered 2012-07-19: 0.4 mg via INTRAVENOUS
  Filled 2012-07-19: qty 1

## 2012-07-19 MED ORDER — FENTANYL (PF) 50 MCG/ML INJECTION SOLUTION
INTRAMUSCULAR | Status: AC
Start: 2012-07-19 — End: 2012-07-19
  Filled 2012-07-19: qty 2

## 2012-07-19 MED ORDER — LIDOCAINE (PF) 100 MG/5 ML (2 %) INTRAVENOUS SYRINGE
INJECTION | Freq: Once | INTRAVENOUS | Status: DC | PRN
Start: 2012-07-19 — End: 2012-07-19
  Administered 2012-07-19: 100 mg via INTRAVENOUS

## 2012-07-19 MED ORDER — KETAMINE 50 MG/ML INJECTION SOLUTION
Freq: Once | INTRAMUSCULAR | Status: DC | PRN
Start: 2012-07-19 — End: 2012-07-19
  Administered 2012-07-19: 5 mg via INTRAVENOUS

## 2012-07-19 MED ORDER — CEFOXITIN 1 GRAM INTRAVENOUS SOLUTION
Freq: Once | INTRAVENOUS | Status: DC | PRN
Start: 2012-07-19 — End: 2012-07-19
  Administered 2012-07-19 (×3): 2000 mg via INTRAVENOUS

## 2012-07-19 MED ORDER — ROPIVACAINE (PF) 2 MG/ML (0.2 %) INJECTION SOLUTION
Freq: Once | INTRAMUSCULAR | Status: DC | PRN
Start: 2012-07-19 — End: 2012-07-19
  Administered 2012-07-19: 5 mL via EPIDURAL

## 2012-07-19 MED ORDER — SODIUM CHLORIDE 0.9 % INTRAVENOUS SOLUTION
INTRAVENOUS | Status: DC
Start: 2012-07-19 — End: 2012-07-19
  Filled 2012-07-19: qty 15

## 2012-07-19 MED ORDER — FENTANYL (PF) 50 MCG/ML INJECTION SOLUTION
50.00 ug | Freq: Once | INTRAMUSCULAR | Status: AC
Start: 2012-07-19 — End: 2012-07-19
  Administered 2012-07-19: 50 ug via INTRAVENOUS

## 2012-07-19 MED ORDER — MORPHINE 2 MG/ML INJECTION WRAPPER
2.0000 mg | INJECTION | INTRAMUSCULAR | Status: DC | PRN
Start: 2012-07-19 — End: 2012-07-22
  Administered 2012-07-19: 2 mg via INTRAVENOUS
  Administered 2012-07-20: 0 mg via INTRAVENOUS
  Administered 2012-07-20 (×5): 2 mg via INTRAVENOUS
  Filled 2012-07-19 (×7): qty 1

## 2012-07-19 MED ORDER — PHENYLEPHRINE 120 MCG/ML IV DILUTION
Freq: Once | INTRAMUSCULAR | Status: DC | PRN
Start: 2012-07-19 — End: 2012-07-19
  Administered 2012-07-19 (×3): 120 ug via INTRAVENOUS

## 2012-07-19 MED ORDER — SODIUM CHLORIDE 0.9 % INTRAVENOUS SOLUTION
1.00 g | Freq: Four times a day (QID) | INTRAVENOUS | Status: DC
Start: 2012-07-19 — End: 2012-07-19

## 2012-07-19 MED ORDER — HYDROMORPHONE 2 MG/ML INJECTION SYRINGE
INJECTION | Freq: Once | INTRAMUSCULAR | Status: DC | PRN
Start: 2012-07-19 — End: 2012-07-19
  Administered 2012-07-19: .8 mg via INTRAVENOUS
  Administered 2012-07-19 (×2): .6 mg via INTRAVENOUS

## 2012-07-19 MED ORDER — MIDAZOLAM 1 MG/ML INJECTION SOLUTION
Freq: Once | INTRAMUSCULAR | Status: DC | PRN
Start: 2012-07-19 — End: 2012-07-19
  Administered 2012-07-19: 4 mg via INTRAVENOUS

## 2012-07-19 MED ORDER — SODIUM CHLORIDE 0.9 % IRRIGATION SOLUTION
1000.0000 mL | Status: DC | PRN
Start: 2012-07-19 — End: 2012-07-19
  Administered 2012-07-19: 4000 mL

## 2012-07-19 MED ORDER — ONDANSETRON HCL (PF) 4 MG/2 ML INJECTION SOLUTION
Freq: Once | INTRAMUSCULAR | Status: DC | PRN
Start: 2012-07-19 — End: 2012-07-19
  Administered 2012-07-19 (×2): 4 mg via INTRAVENOUS

## 2012-07-19 MED ORDER — SUFENTANIL CITRATE 50 MCG/ML INTRAVENOUS SOLUTION
Freq: Once | INTRAVENOUS | Status: DC | PRN
Start: 2012-07-19 — End: 2012-07-19
  Administered 2012-07-19: 5 ug via INTRAVENOUS
  Administered 2012-07-19: 30 ug via INTRAVENOUS
  Administered 2012-07-19: 5 ug via INTRAVENOUS
  Administered 2012-07-19: 10 ug via INTRAVENOUS

## 2012-07-19 MED ORDER — SODIUM CHLORIDE 0.9 % INTRAVENOUS SOLUTION
INTRAVENOUS | Status: DC | PRN
Start: 2012-07-19 — End: 2012-07-19

## 2012-07-19 MED ORDER — ACETAMINOPHEN 1,000 MG/100 ML (10 MG/ML) INTRAVENOUS SOLUTION
Freq: Once | INTRAVENOUS | Status: DC | PRN
Start: 2012-07-19 — End: 2012-07-19
  Administered 2012-07-19: 1000 mg via INTRAVENOUS

## 2012-07-19 MED ORDER — ELECTROLYTE-A INTRAVENOUS SOLUTION
INTRAVENOUS | Status: DC | PRN
Start: 2012-07-19 — End: 2012-07-19

## 2012-07-19 MED ORDER — ONDANSETRON HCL (PF) 4 MG/2 ML INJECTION SOLUTION
4.0000 mg | Freq: Once | INTRAMUSCULAR | Status: DC | PRN
Start: 2012-07-19 — End: 2012-07-19

## 2012-07-19 MED ORDER — NEOSTIGMINE METHYLSULFATE 1 MG/ML INJECTION SOLUTION
Freq: Once | INTRAMUSCULAR | Status: DC | PRN
Start: 2012-07-19 — End: 2012-07-19
  Administered 2012-07-19: 3 mg via INTRAVENOUS

## 2012-07-19 MED ORDER — DEXAMETHASONE SODIUM PHOSPHATE 4 MG/ML INJECTION SOLUTION
Freq: Once | INTRAMUSCULAR | Status: DC | PRN
Start: 2012-07-19 — End: 2012-07-19
  Administered 2012-07-19: 10 mg via INTRAVENOUS

## 2012-07-19 MED ORDER — KETAMINE 50 MG/ML INFUSION - FOR ANES
INJECTION | INTRAVENOUS | Status: DC | PRN
Start: 2012-07-19 — End: 2012-07-19
  Administered 2012-07-19: 0 ug/kg/min via INTRAVENOUS
  Administered 2012-07-19: 10 ug/kg/min via INTRAVENOUS

## 2012-07-19 MED ORDER — GLYCOPYRROLATE 0.2 MG/ML INJECTION SOLUTION
Freq: Once | INTRAMUSCULAR | Status: DC | PRN
Start: 2012-07-19 — End: 2012-07-19
  Administered 2012-07-19: .3 mg via INTRAVENOUS

## 2012-07-19 MED ORDER — PROPOFOL 10 MG/ML IV BOLUS
INJECTION | Freq: Once | INTRAVENOUS | Status: DC | PRN
Start: 2012-07-19 — End: 2012-07-19
  Administered 2012-07-19: 200 mg via INTRAVENOUS

## 2012-07-19 MED ORDER — SODIUM CHLORIDE 0.9 % INTRAVENOUS SOLUTION
12.5000 mg | Freq: Once | INTRAVENOUS | Status: DC | PRN
Start: 2012-07-19 — End: 2012-07-19
  Filled 2012-07-19: qty 0.5

## 2012-07-19 MED ADMIN — HYDROmorphone 50 mg/50 mL in 0.9 % sodium chloride injection: 0 | INTRAVENOUS

## 2012-07-19 MED ADMIN — sodium chloride 0.9 % (flush) injection syringe: 10 mL | NDC 08290306547

## 2012-07-19 MED ADMIN — HYDROmorphone (PF) 1 mg/mL injection solution: 0.2 mg | INTRAVENOUS | NDC 00409255201

## 2012-07-19 MED ADMIN — heparin lock flush (porcine) 10 unit/mL intravenous solution: 0

## 2012-07-19 MED ADMIN — sodium chloride 0.9 % (flush) injection syringe: 0 mL

## 2012-07-19 MED ADMIN — ondansetron HCl (PF) 4 mg/2 mL injection solution: 4 mg | INTRAVENOUS | NDC 00409475503

## 2012-07-19 MED ADMIN — ADULT CUSTOM PARENTERAL NUTRITION: 72 mL/h | INTRAVENOUS

## 2012-07-19 MED FILL — ondansetron HCl (PF) 4 mg/2 mL injection solution: 4.0000 mg | INTRAMUSCULAR | Qty: 2 | Status: AC

## 2012-07-19 MED FILL — HYDROmorphone 1 mg/mL injection syringe: 0.6000 mg | INTRAMUSCULAR | Qty: 1 | Status: AC

## 2012-07-19 MED FILL — enoxaparin 40 mg/0.4 mL subcutaneous syringe: 40.0000 mg | SUBCUTANEOUS | Qty: 0.4 | Status: AC

## 2012-07-19 MED FILL — parenteral amino acid 10 % combination no.6 intravenous solution: INTRAVENOUS | Qty: 950 | Status: AC

## 2012-07-19 MED FILL — diphenhydrAMINE 50 mg/mL injection solution: 25.0000 mg | INTRAMUSCULAR | Qty: 1 | Status: AC

## 2012-07-19 MED FILL — fentaNYL (PF) 50 mcg/mL injection solution: INTRAMUSCULAR | Qty: 25 | Status: AC

## 2012-07-19 SURGICAL SUPPLY — 55 items
APPL 70% ISPRP 2% CHG 26ML CHLRPRP HI-LT ORNG PREP STRL LF  DISP CLR (WOUND CARE SUPPLY) ×1 IMPLANT
BARRIER ADH 5X3IN BIORESBL 6 SHEET DEHP-FR SEPRAFILM SOD HALUR CMC ABD PELVIS STRL LF (BARRIER) ×1 IMPLANT
BLADE 10 BD RB-BCK CBNSTL SURG TISS STRL LF  DISP (SURGICAL CUTTING SUPPLIES) ×1 IMPLANT
BLANKET 3M BAIR HUG ADLT LWR B ODY 60X36IN PLMR AIR SYS LTWT (MISCELLANEOUS PT CARE ITEMS) ×1 IMPLANT
BLANKET 3M BAIR HUG ADLT UPR B ODY 74X24IN PLMR 2 INCS ADH (MISCELLANEOUS PT CARE ITEMS) ×1 IMPLANT
CLIP MED 3.2MM INTERNAL LIGACLIP X TI LGT OPN STRL DISP ENDOS LF  SILVER (ENDOSCOPIC SUPPLIES) ×4 IMPLANT
CLIP MED LRG INTERNAL LIGACLIP X TI LGT OPN STRL DISP ENDOS LF  GRN (ENDOSCOPIC SUPPLIES) ×6 IMPLANT
CONV USE ITEM 14448 - SUTURE 2 GS-25 POLYSRB 30IN VIOL BRD 5 STRN COAT ABS (SUTURE/WOUND CLOSURE) ×1
CONV USE ITEM 338653 - PACK SURG ABDOMINAL NONST DISP LF (CUSTOM TRAYS & PACK) ×1 IMPLANT
COUNTER 40 CNT MAG DVN BOXLOCKS STRL LF  BLK SHARP 1840 PLASTIC FOAM XL DISP (NEEDLES & SYRINGE SUPPLIES) ×1 IMPLANT
DISCONTINUED USE ITEM 35894 - DRAPE MAG 16X10IN DVN FOAM FLXB 121 STRL LF  DISP (EQUIPMENT MINOR) ×1 IMPLANT
DISCONTINUED USE ITEM 44909 - STAPLER SKIN 4.8X3.4MM 35 W STPL 360D ROT HEAD STRL LF  MULTIFIRE PREM DISP SS .51MM (SUTURE STAPLING DEVICES) ×1 IMPLANT
DISSECTOR KITTNER ST 23275410 40PK/BX (SPNG) ×1 IMPLANT
DONUT EXTREMITY CUSHIONING 31143137 (POSITIONING PRODUCTS) ×1 IMPLANT
DRAIN INCS .75IN 18IN PNRS RUB RADOPQ PCUT TUBE STRL LTX DISP (Drains/Resovoirs) IMPLANT
DRAIN INCS 15FR JP SIL HBLS CHNL STRL LF  30MM RND DISP WHT (Drains/Resovoirs) ×1 IMPLANT
DRAIN INCS 19FR .25IN BLAK SIL 4 CHNL RADOPQ HBLS BEND TROCAR STRL RND DISP WHT (Drains/Resovoirs) IMPLANT
DRESS TRNSPR 4.75X4IN ADH FILM WTPRF DIAMOND PTRN TGDRM STRL LF (WOUND CARE SUPPLY) ×2 IMPLANT
DRESS TRNSPR 8X6IN POLYUR ADH HYPOALL WTPRF BASIC TGDRM STRL LF (WOUND CARE SUPPLY) IMPLANT
DRESS WOUND PRMPR NWVN LF  STRL DISP (WOUND CARE SUPPLY) IMPLANT
DRESSING PRIMAPORE 4 X 11_20/BX 66000321 (WOUND CARE/ENTEROSTOMAL SUPPLY)
ELECTRODE ESURG BLADE 6.5IN 3/32IN VLAB STRL SS 1IN DISP STD SHAFT XTD LF (CAUTERY SUPPLIES) ×1 IMPLANT
ELECTRODE ESURG XTD BLADE 6.5I_N 3/32IN VLAB STRL SS DISP STD (CAUTERY SUPPLIES) ×1
ELECTRODE PATIENT RTN 9FT VLAB C30- LB RM PHSV ACRL FOAM CORD NONIRRITATE NONSENSITIZE ADH STRP (CAUTERY SUPPLIES) ×1 IMPLANT
ELECTRODE PATIENT RTN 9FT VLAB_REM C30- LB PLHSV ACRL FOAM (CAUTERY SUPPLIES) ×1
GARMENT COMPRESS MED CALF CENTAURA NYL VASOGRAD LTWT BRTHBL SEQ FIL BLU 18- IN (ORTHOPEDICS (NOT IMPLANTS)) ×1 IMPLANT
HEMOSTAT ABS 8X4IN FLXB SHR WV_SRGCL STRL DISP (WOUND CARE SUPPLY) ×1 IMPLANT
KIT RM TURNOVER CLEANOP CSTM INFCT CONTROL (KITS & TRAYS (DISPOSABLE)) ×1
KIT RM TURNOVER CLEANOP CUSTOM INFCT CONTROL (KITS & TRAYS (DISPOSABLE)) ×1 IMPLANT
LABEL MED EZ PEEL MRKR LF (LABELS/CHART SUPPLIES) ×1 IMPLANT
PACK SURG ABDOMINAL NONST DISP LF (CUSTOM TRAYS & PACK) ×1
PAD ARMBRD BLU (POSITIONING PRODUCTS) ×3 IMPLANT
PAD RELEASE 3 X 4 IN_1050 50/BX (WOUND CARE SUPPLY) ×1 IMPLANT
RELOAD STPLR 80MMX3.8MM GIA TI REG TISS 4 ROW LNR CTR KNIFE BLADE STPL GAP CONTROL STRL 1.5MM LF (ENDOSCOPIC SUPPLIES) ×1 IMPLANT
RESERVOIR DRAIN SIL JP BULB 100CC STRL LF  DISP (WOUND CARE SUPPLY) ×1 IMPLANT
RESERVOIR DRAIN SIL JP BULB 400CC STRL LF  DISP (WOUND CARE SUPPLY) IMPLANT
SEALDIVD ESURG 18CM 13.5MM LIGASURE IMPCT 14D 180D 34MM CURVE JAW OVAL SHAFT NANO COAT OPN 36MM VLAB (SUTURE LIGATING CLIPS) ×1 IMPLANT
SPONGE GAUZE 4X4IN EXCLN AMD PHMB 6 PLY ANTIMIC NWVN HYPOALL LF  STRL DISP (WOUND CARE SUPPLY) ×1 IMPLANT
SPONGE LAP 18X18IN STRL (WOUND CARE SUPPLY) ×4 IMPLANT
SPONGE SURG 4X4IN 16 PLY RADOPQ BAND VISTEC STRL LF  BLU WHT (WOUND CARE SUPPLY) ×1 IMPLANT
STAPLER INTERNAL 80MMX3.8MM TI SS TEARDRP REG TISS 4 ROW LNR CTR QUICK RELEASE BUTTON ADJ FRNG KNOB (ENDOSCOPIC SUPPLIES) ×1 IMPLANT
STAPLER INTERNAL 90MMX3.5MM TI REG TISS 2 ROW LNR CTR 2 ACT HNDL RLD STRL LF  DISP TA BLU ENDOS 1.5 (ENDOSCOPIC SUPPLIES) ×1 IMPLANT
STAPLER SKIN 4.1X6.5MM 35 W STPL CART LF  APS U DISP CLR SS PLASTIC (ENDOSCOPIC SUPPLIES) ×1 IMPLANT
STAPLER SKIN 4.8X3.4MM 35 W STPL 360D ROT HEAD STRL LF  MULTIFIRE PREM DISP SS .51MM (SUTURE STAPLING DEVICES) ×1 IMPLANT
SUTURE 0 POLYSRB 30IN VIOL BRD TIE 6 STRN PCUT ABS (SUTURE/WOUND CLOSURE) ×1 IMPLANT
SUTURE 2 GS-25 POLYSRB 30IN VIOL BRD 5 STRN COAT ABS (SUTURE/WOUND CLOSURE) ×1 IMPLANT
SUTURE 2-0 POLYSRB 30IN VIOL BRD TIE 6 STRN PCUT ABS (SUTURE/WOUND CLOSURE) ×1 IMPLANT
SUTURE 2-0 SH VICRYL 27IN VIOL BRD COAT ABS (SUTURE/WOUND CLOSURE) ×1 IMPLANT
SUTURE 3-0 POLYSRB 30IN VIOL BRD TIE 6 STRN PCUT ABS (SUTURE/WOUND CLOSURE) ×1 IMPLANT
SUTURE 3-0 SH VICRYL 27IN VIOL BRD COAT ABS (SUTURE/WOUND CLOSURE) ×1 IMPLANT
SUTURE 3-0 SH VICRYL+ 27IN UNDYED BRD ANBCTRL COAT ABS (SUTURE/WOUND CLOSURE) ×1 IMPLANT
SUTURE SILK 3-0 SH PERMAHAND 18IN BLK CR BRD 8 STRN NONAB (SUTURE/WOUND CLOSURE) ×8 IMPLANT
TA 90 4.8MM (SURGICAL CUTTING SUPPLIES) ×1 IMPLANT
TOWEL SURG WHT 25X16IN RFDETECT COTTON RADOPQ RF DTBL STRL LF  DISP (PROTECTIVE PRODUCTS/GARMENTS) IMPLANT
TRAY CATH 16FR BARDEX LUBRICATH STATLK SAF-FLOW FOLEY URMTR ADV NATURAL RUB DDRGL 350ML STRL LTX (TRAY) ×1 IMPLANT

## 2012-07-19 NOTE — OR Surgeon (Signed)
 [facility]                                  DEPARTMENT OF SURGERY                                     OPERATION SUMMARY    PATIENT NAME: Michele Mason, Michele Mason  HOSPITAL WLFAZM:[id number]  DATE OF SERVICE:07/19/2012  DATE OF BIRTH: [birth year]    PREOPERATIVE DIAGNOSES:  1.  Gastric cancer.  2.  Sarcoidosis.    POSTPOERATIVE DIAGNOSES:  1.  Gastric cancer.  2.  Sarcoidosis.    NAME OF PROCEDURES:  1.  Exploratory laparotomy.  2.  Lysis of adhesions.  3.  Prior abdominal scar excision.  4.  Distal gastrectomy.    5.  D2 lymphadenectomy.  6.  Umbilical hernia repair.    SURGEONS:  Dale Artist Gearing, MD (staff),  Wanda Chain, MD PGY-4 (assistant).    ESTIMATED BLOOD LOSS:  350 mL.    INTRAVENOUS FLUIDS:  2300 mL crystalloid, 750 mL albumin .    URINE OUTPUT:  1285 mL.       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  On staging studies, she had a chest CT demonstrating evidence of sarcoidosis, including hilar lymphadenopathy and ground-glass opacities in both lobes; however, no nodularity or evidence for metastatic disease was present.  On CT scan of the abdomen and pelvis, there was no evidence of metastatic spread to the liver, peritoneum or other intra-abdominal organs.  An endoscopic ultrasound was performed demonstrating a T3 N0 lesion.  Given her malnutrition from recent weight loss and anemia, the decision was made for operative resection followed by adjuvant therapy as she likely would not be able to make it through neoadjuvant therapy.  The risks of the operation including anastomotic leak, chyle leak, infection, bleeding, intraabdominal abscess, thromboembolic events and anesthetic complications  such as acute MI, stroke and death were discussed with the patient, her father and the remainder of her family in detail.  They verbalized understanding and, after deliberation, agreed to proceed as recommended and signed informed consent.    OPERATIVE FINDINGS:  Large mass in the gastric antrum that did not appear to invade beyond the stomach.  There was a significant amount of retroperitoneal lymphadenopathy, specifically near the celiac axis and in the porta hepatitis.  On frozen section analysis, one of these lymph nodes was examined and found to be consistent with granulomatous disease without evidence of malignancy.  The initial margin on the lesser curvature of the proximal resection of our gastric margin had evidence of 1 focus of adenocarcinoma 1 cm from the margin.  Therefore, a further 3-4 cm of stomach were taken as a definitive margin.    DESCRIPTION OF PROCEDURE:  Once the procedure was explained and informed consent was obtained, the patient was transferred to the operating room and placed supine on the operating room table.  After completion of the proper patient verification and timeout protocols, an epidural catheter was placed by the anesthetic team.  She was then placed under general endotracheal anesthesia.  Multiple IV lines, an  arterial line and Foley catheter were placed for intraoperative hemodynamic monitoring.  The patient's abdomen was shaved, prepped and draped in the standard surgical fashion.    Given the evidence of poor healing of her prior incision, a decision was made for scar revision which was accomplished by creating an ellipse of the scar throughout the entirety of her prior scar from previous surgery in the 1980s.  The scar itself was elevated off the underlying subcutaneous tissue utilizing electrocautery and passed off the table as permanent pathologic specimen.  The incision was then carried down through the subcutaneous tissue utilizing electrocautery and the fascia was  encountered with multiple Prolene sutures visible.  These were all excised.  The fascia was incised in the midline and the peritoneum grasped, elevated and incised with Metzenbaum scissors and then the peritoneum was opened throughout the entirety of the incision.  At this point, there was limited adhesive disease of the greater omentum to the anterior abdominal wall, which was taken down carefully utilizing Bovie electrocautery and Metzenbaum scissors.  An umbilical hernia was noted and was reduced within the intraabdominal cavity.  It was incorporated into our fascial opening to ensure adequate repair upon closure.  The omentum was then completely taken down from the anterior abdominal wall in the pelvis and the omentum was able to be elevated superiorly into the wound.  At this point, the remainder of the abdomen was explored and the uterus and ovaries appeared grossly normal with no palpable disease.  The entirety of the colon and small intestine appeared normal.  The liver was inspected both visually and by palpation and was found to be without lesions or nodularity.  The peritoneal structures were all shimmering with no evidence of nodularity or implants.  The diaphragms were also inspected in a similar fashion and were without lesions.  At this point, the greater omentum was detached from the transverse colon utilizing electrocautery.  Once the entire greater omentum was completely mobile, the Thompson retractor was placed and was used for retraction for the remainder of the case.      Our dissection began posteriorly by following the right gastroepiploic vein to its origin on the middle colic vein and this was ligated utilizing 2-0 silk ties.  The stomach was then elevated carefully off of the pancreas to the second portion of the duodenum.  The hepatic flexure and duodenum were then fully mobilized so that the IVC was readily visible.  The common bile duct was noted and found to be terminating in the second  portion of the duodenum.  At this point, a large lymph node was noted in the lateral aspect of the porta hepatitis and was excised from the surrounding structures utilizing electrocautery.  This was passed off the table as a frozen section and showed granulomatous disease with no evidence of cancer.  Therefore, it was felt that the lesion was resectable and resection margins were chosen approximately 5 cm up the lesser curvature from the mass and a similar margin on the greater curvature.  The omentum was brought out of the left upper quadrant and was excised off the stomach utilizing LigaSure device.  The lesser omentum was then elevated and incised, and the junction of the left and right gastric arteries was identified and suture-ligatured and transected.  At this point, a similar window was created at the junction of the first and second portions of the duodenum.  There was some venous drainage of the first portion of the duodenum that  required suture ligation with 2-0 silk sutures.  The duodenum was then transected utilizing a solitary fire of the GIA stapler utilizing a blue load.  The stomach was then stapled utilizing a TIA 90-mm green load.  The stomach was transected and the proximal lesser and greater curvature margins were sent as separate frozen sections.  The duodenal margin was also sent as frozen section and demonstrated no evidence of malignancy.  The greater curvature margin had no evidence of malignancy.  The lesser curvature margin had a focus of adenocarcinoma 1 cm from the margin.  Therefore, a further 3-4 cm of the stomach was transected by further incising the lesser omentum and greater omentum off the stomach and firing a TIA 90 green load stapler at this location.  This was once again sent for frozen section analysis and no evidence of further malignancy was seen.    At this point, with the specimen out and passed off the table for permanent pathologic sectioning, our D2 lymphadenectomy was  undertaken.  The celiac axis was palpable and the common hepatic artery was traced from its origin up into the porta hepatitis.  All the nodal tissue superior to this artery was carefully dissected free of surrounding tissue utilizing both clips and electrocautery.  We were careful to place clips on any structure that appeared to be either vascular or lymphatic.  This dissection was carried down along the superior edge of the pancreas and along the splenic artery for approximately half of the course of the splenic artery.  All the lymphatic tissue superior to the celiac axis was then completely excised en bloc and sent for permanent pathologic sectioning.  During this procedure, there was some continued oozing from lymphatic tissue; however, very minimal blood loss was encountered.  After completion of the lymphadenectomy, there was evidence of complete skeletonization of the celiac axis vessels with no further lymph nodes identifiable.  Within the specimen itself were multiple enlarged but soft blackish-appearing lymph nodes.  In addition, on completion of lymphadenectomy, there was no bleeding or evidence for lymphatic leakage.    At this point, we began our reconstruction, which was a Roux-en-Y gastrojejunostomy.  The ligament of Treitz was identified and measured for 30-cm and transected with a blue load of the GIA stapler.  The mesentery was taken utilizing the LigaSure to allow for adequate length on the Roux limb to reach the stomach.  Given the redundancy of the colon, it was felt that the best anastomosis would be a retrocolic anastomosis and so a window was created in the bare area of the left colon just to the left of the middle colic vessels.  The Roux limb was brought within this and placed up next to the stomach and reached quite easily without undue tension.  A gastrojejunostomy was created in a 2-layer approach utilizing 3-0 silk as the outer layer and 3-0 Vicryl running internal layer.  The  remainder of the staple line on the stomach was oversewn utilizing 3-0 silk Lembert sutures.  At the level of the transverse colon mesentery, the mesentery was sutured to the bowel utilizing 3-0 silk sutures to remove any potential for internal hernias.  A jejunojejunostomy was then created approximately 45 cm down on the Roux limb from the previously created afferent limbs.  The anastomosis was approximately 3 cm in greatest diameter and was created utilizing a single layer of 3-0 silk interrupted sutures.  The bowel upstream to this was then affixed to the Roux limb to ensure  no torsion or twisting to prevent difficulties with emptying of the afferent limb.  The entirety of the abdomen was then copiously irrigated with normal saline until the effluent was clear.  A total of approximately 3 liters of irrigation was utilized.  A 15-French Blake drain was brought out the right flank and placed into the Morison's pouch along the duodenal stump with the tip beneath the gastrojejunostomy.  It was sutured to the skin utilizing 2-0 nylon suture.  The abdomen was once again inspected and no evidence of bleeding was noted.  Therefore, 6 sheets of Seprafilm were placed in all quadrants of the abdomen to prevent future adhesions.  The fascia was then closed utilizing a running looped #1 PDS with interrupted #1 Vicryls every 3-4 cm as internal retention sutures.  The wound was copiously irrigated with warm normal saline until the effluent was clear and the skin was closed utilizing several interrupted 3-0 Vicryl deep dermal stitches followed by staples for the skin.  The skin in the abdomen was then washed and dried and a dry sterile dressing applied along with a drain sponge around the drain itself.    The patient was then awakened from anesthesia, having tolerated the procedure well and was transferred extubated to the recovery room in stable condition.  The instrument, sponge equipment count was correct x2.  I was present  and scrubbed throughout the entirety of this case.    Dale Artist Gearing, MD  Assistant Professor  [facility] Department of Surgery    [id number]; D: 07/19/2012 16:47:57; T: 07/19/2012 21:32:51

## 2012-07-19 NOTE — Care Plan (Signed)
Problem: General Plan of Care(Adult,OB)  Goal: Plan of Care Review(Adult,OB)  The patient and/or their representative will communicate an understanding of their plan of care   Outcome: Ongoing (see interventions/notes)  Knowing the patient was scheduled for surgery today, I stopped by her room. She had gone to surgery, but her father was present. He spoke of his faith and of his daughter's faith and stated that they had many people praying for her and he has confidence that God will provide. I offered my prayerful support as well. Father shared some of the patient's story from his perspective.

## 2012-07-19 NOTE — Procedures (Addendum)
[facility]  Anesthesiology Procedure Note    Michele Mason  [id number]  07/19/2012       Pain Diagnosis:  Other acute postoperative pain NOS   338.18         Procedure:   Thoracic epidural block, continuous with catheter for     62319--[id number] modifer, performed for postoperative pain mgt     Block requested by Dr. Leota Sauers for postoperative pain management.    Description: The risks, benefits, and alternatives to the procedure were discussed. All questions were answered and informed consent was obtained.  Monitors were placed.  A time-out was preformed.  The patient was prepped and draped in sterile fashion.  Local infiltration was performed and an 18 gauge hustead needle was advanced in the T11 interspace.  Loss of resistance was obtained at 5 cm.  No blood of CSF was present.  A 20 gauge polyamide catheter was inserted and secured with a sterile dressing.  Catheter was left at 12 cm at the skin.  Aspiration of the catheter revealed no blood or CSF.  There were no paresthesias.  A test dose of 45 mg lidocaine and 15 mcg of epinephrine was negative.  The patient tolerated the procedure well and there were no complications.        Sedation: 2 mg midazolam and 100 mcg fentanyl were given intravenously for an appropriate degree of anxiolysis and analgesia.Marland Kitchen    Operators: Jacquenette Shone (resident) / Alvie Heidelberg (staff)    07/19/2012    [name], MD 07/19/2012, 10:19 AM      I was present and supervised/observed the entire procedure.  Mishawn Didion Valora Corporal, MD 07/19/2012, 1:51 PM

## 2012-07-19 NOTE — Ancillary Notes (Signed)
Medical Nutrition Therapy Follow Up       Reason for Assessment: Follow up    SUBJECTIVE :   Unable to speak to pt as she is in surgery; no family present at this time.     OBJECTIVE:  Current Diet Order/Nutrition Support:  NPO; TPN provides 1600 cal (31 cal/kg), 95 g prot (2 g/kg), 1220 dextrose cal (GIR 4.8) at 72 ml/hr (no IL due to shortage)     Height:  154 cm     Weight trends: (1/28- standing weight) 59.4 kg , 1/31- 62.7 kg, 2/2- 62.8 kg   IBW: 47.7 kg %IBW: 125 % Adj BW: 51 kg  UBW: 66.3 kg (9 weeks ago) %UBW: 89 % Weight change: has decreased 7 kg or 10.5 % in 9 weeks     Physical Assessment:  Overall Physical Appearance: pt in surgery    Labs:   BMP:     Recent Labs      07/19/12   0200   07/19/12   1315   SODIUM  138   < >  135*   POTASSIUM  4.6   --    --    CHLORIDE  105   < >  107   CO2  28   --    --    BUN  14   --    --    CREATININE  0.66   --    --    GLUCOSEFAST  104   --    --    ANIONGAP  5   --    --    BUNCRRATIO  21   --    --    GFR  >59   --    --    CALCIUM  9.2   --    --     < > = values in this interval not displayed.       Sodium:    Recent Labs      07/19/12   1315   SODIUM  135*     Potassium:     Recent Labs      07/19/12   0200   POTASSIUM  4.6     BUN/Creatinine:     Recent Labs      07/19/12   0200   BUNCRRATIO  21   Results for DAY, GREB (MRN [id number]) as of 07/19/2012 14:39   Ref. Range 07/18/2012 06:42 07/18/2012 11:24 07/18/2012 16:17 07/18/2012 21:20   GLUCOSE, POINT OF CARE Latest Range: 70-105 mg/dL 409 (H) 811 (H) 914 (H) 111 (H)       MNT Labs:     Albumin/Prealbumin/CRP:     ALBUMIN   Date Value Range Status   07/17/2012 2.7* 3.5 - 4.8 g/dL Final   7/82/9562 2.9* 3.5 - 4.8 g/dL Final        PREALBUMIN   Date Value Range Status   07/17/2012 16.7* 18 - 40 mg/dL Final   07/14/8655 84.6* 18 - 40 mg/dL Final     H&H:    HGB   Date Value Range Status   07/19/2012 10.9* 11.2 - 15.2 g/dL Final        HCT   Date Value Range Status   07/19/2012 33.4* 33.5 - 45.2 % Final       Meds:   Current  Facility-Administered Medications:  acetaminophen (TYLENOL) tablet 650 mg Oral Q4H PRN   adult custom parenteral nutrition  Intravenous Continuous   adult  custom parenteral nutrition  Intravenous Continuous   benzocaine 7.5% oral gel  Mucous Membrane 6x/day PRN   diphenhydrAMINE (BENADRYL) 50 mg/mL injection 50 mg Intravenous Q6H PRN   diphenhydrAMINE (BENADRYL) 50 mg/mL injection 6.5 mg Intravenous Q6H PRN   docusate sodium (COLACE) capsule 100 mg Oral 2x/day   enoxaparin (LOVENOX) 40 mg/0.4 mL SubQ injection 40 mg Subcutaneous Q24H   esomeprazole (NEXIUM) injection 40 mg Intravenous Daily   fentanyl (PF) (SUBLIMAZE) 5 mcg/mL, bupivacaine (PF) (MARCAINE) 0.125 % in NS 250 mL (tot vol) epidural infusion  Epidural Continuous   heparin flush (HEPFLUSH) 10 units/mL injection 2-6 mL Intracatheter Q8HRS   heparin flush (HEPFLUSH) 10 units/mL injection 2 mL Intracatheter Q1 MIN PRN   HYDROmorphone (DILAUDID) 1 mg/mL (tot vol 100 mL) in NS PCA 1 Bag Intravenous Q1H PRN   HYDROmorphone (DILAUDID) 1 mg/mL injection 0.2 mg Intravenous Q2H PRN   lorazepam (ATIVAN) tablet 0.5 mg Oral 4x/day   LR premix infusion  Intravenous Continuous   LR premix infusion  Intravenous Continuous   morphine (MS CONTIN) extended release tablet 30 mg Oral Q12H   NS flush syringe 10-30 mL Intracatheter Q8HRS   NS flush syringe 20-30 mL Intracatheter Q1 MIN PRN   ondansetron (ZOFRAN) 2 mg/mL injection 4 mg Intravenous Q6H PRN   ondansetron (ZOFRAN) 2 mg/mL injection 4 mg Intravenous Q6H PRN   polyethylene glycol (MIRALAX) oral packet 17 g Oral 2x/day   promethazine (PHENERGAN) 25 mg/mL injection 25 mg Intramuscular Q6H PRN   sennosides-docusate sodium (SENOKOT-S) 8.6-50mg  per tablet 1 Tab Oral 2x/day       ASSESSMENT:    Re-assessed needs if applicable:    Calories: 30-33 Cals/59.4 kg = 1550-1700 calories/day  Protein: 1.5-2 g/59.4kg = 89-119 grams protein/day  Fluid: 30-33 mLs/59.4kg = 1550-1700  mLs/day      PLAN / INTERVENTION        Goals:   Tolerate diet/ nutrition support, Meet estimated needs for nutrients/fluids, Advance Nutrition and Tighter Glucose Control    Monitor / Evaluate: Monitor: I/O's, Weight Status and Pertinent Labs    Tolerance of : nutrition support    Recommend :   1. TPN at goal and meeting 100% of estimated needs.  2. Will add IL to TPN today,to prevent EFAD. New Goal TPN will provide 1730 cal (334 cal/kg), 95 g prot (2 g/kg), 1220 dextrose cal (GIR 4.8), and 130 IL calories  (8% of total calories) at ~ 74 ml/hr.  3. Continue to monitor electrolytes, blood sugars, Mg++, Phos while on TPN.  4. Monitor daily weights.  5. Will follow.     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     Terisa Starr, Dietetic Intern 07/19/2012, 1:35 PM

## 2012-07-19 NOTE — Nurses Notes (Signed)
Report called to holding to patient to go to OR this morning. Patient did take 2nd OR bath this morning. Patient was refusing to take out her tampon, however, after she showered it is unknown if she has removed the tampon. Patient stated, "I will take it out before they take me down". TPN and MIVF with Dilaudid PCA continue to infuse through her RUE Duel Lumen PICC. Patient awaiting transfer to OR at this time, will continue to monitor.

## 2012-07-19 NOTE — OR Surgeon (Addendum)
[facility]  BRIEF OPERATIVE NOTE    Patient Name: [facility] Number: [id number]  Date of Service: 07/19/2012  Date of Birth: Jun 13, 1976    Pre-Operative Diagnosis:  1. Gastric cancer  2. Sarcoidosis  Post-Operative Diagnosis:  1. Gastric cancer  2. Sarcoidosis  Procedure(s)/Description:    1. Exploratory laparotomy  2. Lysis of adhesions  3. Prior abdominal scar excision  4. Distal gastrectomy  5. D2 Lymphadenectomy  6. Umbilical hernia repair    Findings/Complexity (inherent to the procedure performed): Large mass in the antrum.  Multiple enlarged lymph nodes in the porta hepatis and around the celiac axis.  Frozen section identified granulomatous disease.  Frozen section analysis of margins were negative.   Complications (unintended/unexpected/iatrogenic/accidental/inadvertent events): None.  Wound Class: Clean Contaminated Wounds -Respiratory, GI, Genital, or Urinary    Attending Surgeon: Leota Sauers  Assistant(s): [facility] PGY4    Anesthesia Type: General endotracheal anesthesia  Antibiotics: Cefoxitin  Estimated Blood Loss: 350 mL  Fluids Given: 2300 mL crystalloid, 750 mL albumin  Blood Given: 0 units  Urine Output: 1285 mL    Tubes: Nasogastric Tube  Drains: Al Pimple Drain  Specimens/ Cultures: See OR sheet.  Implants: None.           Disposition: PACU - hemodynamically stable.  Condition: stable

## 2012-07-19 NOTE — Progress Notes (Addendum)
Surgery   Post Op Check    S: patient feels better now that epidural has been bolused.  She has pain only in the upper abdomen.  She is still a little drowsy from anesthesia.    O: BP 104/66   Pulse 89   Temp(Src) 36.5 C (97.7 F)   Resp 14   Ht 1.549 m (5\' 1" )   Wt 62.8 kg (138 lb 7.2 oz)   BMI 26.17 kg/m2   SpO2 100%  Gen: NAD, slightly drowsy  Respiratory: CTAB  CV: RRR  Abdomen: soft, nondistended, appropriately TTP.  Midline dressing C/D/I. Right abdominal drain with 30 cc sanguinous output.    UOP since surgery: 300 ml    Assessment/Plan:  37 y.o. yo female s/p distal gastrectomy with gastrojejunostomy.  Continue epidural for pain  No PCA unless pain is not well controlled on epidural  NGT to LIS  Strict NPO  JP to bulb suction  Continue foley  Continue TPN and IVF      [name], MD 07/19/2012, 5:17 PM    I was not present for this patient encounter.  I have reviewed the chief complaint, history, physical examination, laboratory values, imaging reports, and have discussed the plan of care for this patient as outlined by the resident above.  Please see the resident's note, which I have carefully reviewed, for full details.  Any additions/exceptions are edited/noted.    [name], MD 07/19/2012 6:42 PM  Assistant Professor, Division of Surgical Oncology  [facility] Surgery

## 2012-07-19 NOTE — Progress Notes (Addendum)
[facility]                                                  SURG-ONCOLOGY PROGRESS NOTE    Michele Mason, Michele Mason, 37 y.o. female  Date of Admission:  07/09/2012  Date of Birth:  10/17/1975  Date of Service:  07/19/2012    Post Op Day: Day of Surgery S/P Procedure(s) (LRB):  GASTRECTOMY (N/A)    Subjective: States her RUE is sore today around PICC but otherwise denies new pain, nausea, or vomiting. States she received a blood transfusion overnight. States she continues to experience pain that comes and goes in her left wrist. States it is made worse with blood pressure cuff cycling. States she did not received injection from ortho yesterday for treatment of DeQuervain tenosynovitis. States she has a new "wart/blister," on her right hand she is concerned about. States she is sleeping better then previously though it is for a couple of hours at a time. States she had a BM yesterday. States her spirits are good and she is ready for today's surgery. Per nurses note, patient has a tampon in and won't remove it as instructed for surgery.       Vital Signs:  Temp (24hrs) Max:37.2 C (99 F)      Systolic (24hrs), Avg:117 mmHg, Min:99 mmHg, Max:131 mmHg    Diastolic (24hrs), Avg:76 mmHg, Min:59 mmHg, Max:91 mmHg    Temp  Avg: 36.9 C (98.4 F)  Min: 36.4 C (97.5 F)  Max: 37.2 C (99 F)  Pulse  Avg: 92.9  Min: 75  Max: 125  Resp  Avg: 18  Min: 18  Max: 18  SpO2  Avg: 99.8 %  Min: 99 %  Max: 100 %  Pain Score (Numeric, Faces): 8    Current Medications:    Current Facility-Administered Medications:  acetaminophen (TYLENOL) tablet 650 mg Oral Q4H PRN   adult custom parenteral nutrition  Intravenous Continuous   benzocaine 7.5% oral gel  Mucous Membrane 6x/day PRN   diphenhydrAMINE (BENADRYL) 50 mg/mL injection 50 mg Intravenous Q6H PRN   docusate sodium (COLACE) capsule 100 mg Oral 2x/day   enoxaparin (LOVENOX) 40 mg/0.4 mL SubQ injection 40 mg Subcutaneous Q24H    esomeprazole (NEXIUM) injection 40 mg Intravenous Daily   heparin flush (HEPFLUSH) 10 units/mL injection 2-6 mL Intracatheter Q8HRS   heparin flush (HEPFLUSH) 10 units/mL injection 2 mL Intracatheter Q1 MIN PRN   HYDROmorphone (DILAUDID) 1 mg/mL (tot vol 100 mL) in NS PCA 1 Bag Intravenous Q1H PRN   HYDROmorphone (DILAUDID) 1 mg/mL injection 0.2 mg Intravenous Q2H PRN   lorazepam (ATIVAN) tablet 0.5 mg Oral 4x/day   LR premix infusion  Intravenous Continuous   LR premix infusion  Intravenous Continuous   morphine (MS CONTIN) extended release tablet 30 mg Oral Q12H   NS flush syringe 10-30 mL Intracatheter Q8HRS   NS flush syringe 20-30 mL Intracatheter Q1 MIN PRN   ondansetron (ZOFRAN) 2 mg/mL injection 4 mg Intravenous Q6H PRN   polyethylene glycol (MIRALAX) oral packet 17 g Oral 2x/day   promethazine (PHENERGAN) 25 mg/mL injection 25 mg Intramuscular Q6H PRN   sennosides-docusate sodium (SENOKOT-S) 8.6-50mg  per tablet 1 Tab Oral 2x/day       Today's Physical Exam:  General: appears in good health and no distress   Lungs: Clear to auscultation bilaterally.  Cardiovascular: regular rate and rhythm   Abdomen: non-distended, soft, mildly tender to palpation   Neurologic: Grossly normal, Alert and oriented x3   Psychiatric: smiling, pleasant, family at bedside  Extremities: Left Thumb spica in place    I/O:  I/O last 24 hours:      Intake/Output Summary (Last 24 hours) at 07/19/12 0728  Last data filed at 07/19/12 0600   Gross per 24 hour   Intake   4065 ml   Output      0 ml   Net   4065 ml     I/O current shift:  02/05 0000 - 02/05 0759  In: 688 [I.V.:688]  Out: -     Prophylaxis:  Date Started Date Completed   DVT/PE  Lovenox and SCDs/ Venodynes     GI Miralax, Colace, Senokot         Nutrition/Residuals:  NPO    Labs    Reviewed:   Recent Labs      07/17/12   0137  07/18/12   0318  07/19/12   0200   SODIUM  136  135*  138   POTASSIUM  4.1  4.2  4.6   CHLORIDE  102  104  105   BUN  14  13  14     CREATININE  0.57  0.67  0.66   ANIONGAP  5  4*  5   CALCIUM  8.7  8.6  9.2   MAGNESIUM   --   1.9   --    PHOSPHORUS   --   2.4   --    WBC  7.1  5.6  7.5   HGB  8.4*  7.9*  10.9*   HCT  26.1*  24.8*  33.4*   PLTCNT  132*  128*  168       Recent Labs      07/17/12   0137   ALBUMIN  2.7*   PREALBUMIN  16.7*     Ordered:  None    Diagnostic Tests   Reviewed: N/A  Ordered:  None    Radiology Tests   Reviewed: N/A  Ordered:  None    Assessment/ Plan:   Active Hospital Problems   (*Primary Problem)    Diagnosis   . *Gastric cancer   . Moderate protein-calorie malnutrition   . Chronic pain disorder   . Sarcoidosis     37 year old female with antral mass highly suspicious for gastric adenocarcinoma per biopsies from [facility]. 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     Surgery today  Renewed TPN   Cont NPO   MIVF: LR @ 100/hr   DeQuervain tenosynovitis:   Ortho following, will continue thumb spica at this time, if pain persists ortho will inject thumb (thank you for recs)  RUE pain   No signs of infection or infiltration. Will continue to monitor.  Right finger blister:   Appears benign, no erythema, warmth, or pain to palp appreciated. Will continue to monitor.   Continue Miralax for bowel aid    Consent in chart for distal gastrectomy, D2 lymphadenectomyy, and any associated surgeries   Sleep:   Sleeping better, anxiety improving   H/H now 10.9/33.4 (increased from 7.9/24.8)  Per patient, she does not have a tampon in. States she is wearing a pad. States the nurse was "telling stories on her."   Will proceed with scheduled surgery.    Chauncey Fischer, PA-C 07/19/2012, 7:39 AM  I saw and examined this patient with the mid-level provider above.  Please see the mid-level's note, which I have carefully reviewed, for full details. I agree with the findings and plan of care as documented in the mid-level's note.  Any additions/exceptions are edited/noted.     To OR today for distal gastrectomy, lymphadenectomy.    [name], MD 07/19/2012 4:16 PM  Assistant Professor, Division of Surgical Oncology  [facility] Department of Surgery

## 2012-07-19 NOTE — Nurses Notes (Signed)
Patient refusing 2nd OR bath at this time, Patient stated, "I'm too tired" Patient educated as to why two OR baths are completed before surgery to reduce the risk of infection. Patient still refused. Will continue to monitor patient.

## 2012-07-19 NOTE — OR Nursing (Addendum)
 Attempted to update patient's family in waiting room by phone at 1220.  No family was present.  Left a message for the family with the waiting room receptionist that the procedure is going well and that we are still working.     Patient's family updated in waiting room by phone at 1359 per family's request.

## 2012-07-19 NOTE — Ancillary Notes (Addendum)
07/19/12 1042   Clinical Encounter Type   Reason for Visit Pre-Surgery Visit   Referral From Self Initiated   Declined Spiritual Care Visit At This Time No   Visited With Father   Family Spiritual Encounters   Spiritual Assessment Using Prayer and Faith for Support;Hopeful;Anxiety/Fear   Interventions Provided supportive presence;Facilitated story telling;Clarified values/goals/decisions   Outcomes Spiritual Care relationship established;Family shared/processed patient's story   How has patient's hospitalization affected family? family is commuting 3.5 hours to support patient   Family's Goals/ Hopes Family wants today's surgery to be a strong success   Family Coping through prayer and faith.    [facility]  Spiritual Care Note    Patient Name:  Michele Mason  Date of Encounter:   07/19/2012    Other Pertinent Information: Knowing the patient was scheduled for surgery today, I stopped by her room. She had gone to surgery, but her father was present. He spoke of his faith and of his daughter's faith and stated that they had many people praying for her and he has confidence that God will provide. I offered my prayerful support as well.  Father shared some of the patient's story from his perspective.     Kara Pacer, CR  Pager: [phone]  Total Time of Encounter: 15 min.  I have reviewed and cosigned this note as written.  Rev. Derl Barrow, Th.M., Clinical Pastoral Education Supervisor

## 2012-07-19 NOTE — Anesthesia Transfer of Care (Signed)
ANESTHESIA TRANSFER OF CARE NOTE        Anesthesia Service      [facility]         Last Vitals: Temperature: 36.5 C (97.7 F) (07/19/12 1524)  Heart Rate: 90 (07/19/12 1524)  BP (Non-Invasive): 119/64 mmHg (07/19/12 1524)  Respiratory Rate: 20 (07/19/12 1524)  SpO2-1: 100 % (07/19/12 1524)  Pain Score (Numeric, Faces): 1 (07/19/12 0727)    Patient transferred to PACU.  VSS.  Ventilating well, respirations regular, airway patent.  Report given to RN at bedside.    2/5/2014at 3:24 PM.

## 2012-07-19 NOTE — OR PostOp (Signed)
DR JEFFERS ANESTHESIA AT BEDSIDE TO EVALUATE PAIN MANAGEMENT.

## 2012-07-19 NOTE — Nurses Notes (Signed)
Patient has Ancef that is showing up as due on the [facility]. RN found the medication listed under the Periop tab for one time pre-op. Dr. Georgianne Fick notified inquiring if he wanted RN to administer Ancef now or wait until patient goes down to pre-op. Dr. Georgianne Fick stated to hold medication under she goes to pre-op. Will continue to monitor.

## 2012-07-20 LAB — BASIC METABOLIC PANEL, FASTING
ANION GAP: 4 mmol/L — ABNORMAL LOW (ref 5–16)
BUN/CREAT RATIO: 25 — ABNORMAL HIGH (ref 6–22)
BUN: 16 mg/dL (ref 6–20)
CALCIUM: 8.4 mg/dL — ABNORMAL LOW (ref 8.5–10.4)
CARBON DIOXIDE: 24 mmol/L (ref 22–32)
CHLORIDE: 105 mmol/L (ref 96–111)
CREATININE: 0.65 mg/dL (ref 0.49–1.10)
ESTIMATED GLOMERULAR FILTRATION RATE: >59 mL/min/{1.73_m2} (ref >59)
GLUCOSE, FASTING: 218 mg/dL — ABNORMAL HIGH (ref 70–105)
POTASSIUM: 4 mmol/L (ref 3.5–5.1)
SODIUM: 133 mmol/L — ABNORMAL LOW (ref 136–145)

## 2012-07-20 LAB — CBC/DIFF
BASOPHILS: 0 %
BASOS ABS: 0.022 10*3/uL (ref 0.0–0.2)
EOS ABS: 0 10*3/uL (ref 0.0–0.5)
EOSINOPHIL: 0 %
HCT: 28.1 % — ABNORMAL LOW (ref 33.5–45.2)
HGB: 9.2 g/dL — ABNORMAL LOW (ref 11.2–15.2)
LYMPHOCYTES: 2 %
LYMPHS ABS: 0.349 THOU/uL — ABNORMAL LOW (ref 1.0–4.8)
MCH: 28.5 pg (ref 27.4–33.0)
MCHC: 32.8 g/dL (ref 32.5–35.8)
MCV: 86.7 fL (ref 78–100)
MONOCYTES: 7 %
MONOS ABS: 1.25 THOU/uL — ABNORMAL HIGH (ref 0.3–1.0)
MPV: 9.3 fL (ref 7.5–11.5)
PLATELET COUNT: 146 THOU/uL (ref 140–450)
PMN ABS: 15.317 10*3/uL — ABNORMAL HIGH (ref 1.5–7.7)
PMN'S: 91 %
RBC: 3.24 MIL/uL — ABNORMAL LOW (ref 3.63–4.92)
RDW: 15.6 % — ABNORMAL HIGH (ref 12.0–15.0)
WBC: 16.9 10*3/uL — ABNORMAL HIGH (ref 3.5–11.0)

## 2012-07-20 LAB — TROPONIN-I: TROPONIN-I: 0.015 ng/mL (ref <0.030)

## 2012-07-20 LAB — PERFORM POC WHOLE BLOOD GLUCOSE
GLUCOSE, POINT OF CARE: 210 mg/dL — ABNORMAL HIGH (ref 70–105)
GLUCOSE, POINT OF CARE: 158 mg/dL — ABNORMAL HIGH (ref 70–105)
GLUCOSE, POINT OF CARE: 143 mg/dL — ABNORMAL HIGH (ref 70–105)
GLUCOSE, POINT OF CARE: 76 mg/dL (ref 70–105)
GLUCOSE, POINT OF CARE: 92 mg/dL (ref 70–105)

## 2012-07-20 MED ORDER — ROPIVACAINE 0.2% EPIDURAL (TOT VOL 200ML) INFUSION-ADULT
INJECTION | Status: DC
Start: 2012-07-20 — End: 2012-07-21
  Filled 2012-07-20 (×3): qty 200

## 2012-07-20 MED ORDER — ACETAMINOPHEN 1,000 MG/100 ML (10 MG/ML) INTRAVENOUS SOLUTION
650.00 mg | Freq: Four times a day (QID) | INTRAVENOUS | Status: DC
Start: 2012-07-20 — End: 2012-07-25
  Administered 2012-07-20 – 2012-07-23 (×13): 650 mg via INTRAVENOUS
  Administered 2012-07-23: 0 mg via INTRAVENOUS
  Administered 2012-07-23: 650 mg via INTRAVENOUS
  Administered 2012-07-23: 0 mg via INTRAVENOUS
  Administered 2012-07-24 (×3): 650 mg via INTRAVENOUS
  Administered 2012-07-24: 0 mg via INTRAVENOUS
  Administered 2012-07-25 (×2): 650 mg via INTRAVENOUS
  Filled 2012-07-20 (×24): qty 65

## 2012-07-20 MED ORDER — KETOROLAC 30 MG/ML (1 ML) INJECTION SOLUTION
30.0000 mg | Freq: Three times a day (TID) | INTRAMUSCULAR | Status: AC
Start: 2012-07-20 — End: 2012-07-23
  Administered 2012-07-20 – 2012-07-23 (×9): 30 mg via INTRAVENOUS
  Filled 2012-07-20 (×9): qty 1

## 2012-07-20 MED ORDER — LACTATED RINGERS INTRAVENOUS SOLUTION
INTRAVENOUS | Status: DC
Start: 2012-07-20 — End: 2012-07-24

## 2012-07-20 MED ORDER — HYDROMORPHONE 50 MG/50 ML IN 0.9 % SODIUM CHLORIDE INJECTION
1.0000 | INTRAMUSCULAR | Status: DC | PRN
Start: 2012-07-20 — End: 2012-07-20
  Filled 2012-07-20: qty 1

## 2012-07-20 MED ORDER — HYDROMORPHONE 50 MG/50 ML IN 0.9 % SODIUM CHLORIDE INJECTION
1.0000 | INTRAMUSCULAR | Status: DC | PRN
Start: 2012-07-20 — End: 2012-07-23
  Filled 2012-07-20: qty 1

## 2012-07-20 MED ORDER — HYDROMORPHONE 1 MG/ML INJECTION WRAPPER
0.6000 mg | INJECTION | INTRAMUSCULAR | Status: AC
Start: 2012-07-20 — End: 2012-07-20
  Administered 2012-07-20: 0.6 mg via INTRAVENOUS
  Filled 2012-07-20: qty 1

## 2012-07-20 MED ORDER — HYDROMORPHONE 1 MG/ML INJECTION WRAPPER
0.4000 mg | INJECTION | INTRAMUSCULAR | Status: DC | PRN
Start: 2012-07-20 — End: 2012-07-29
  Administered 2012-07-20 – 2012-07-29 (×11): 0.4 mg via INTRAVENOUS
  Filled 2012-07-20 (×10): qty 1

## 2012-07-20 MED ADMIN — ondansetron HCl (PF) 4 mg/2 mL injection solution: 4 mg | INTRAVENOUS | NDC 00703722101

## 2012-07-20 MED ADMIN — heparin lock flush (porcine) 10 unit/mL intravenous solution: 0

## 2012-07-20 MED ADMIN — HYDROmorphone 50 mg/50 mL in 0.9 % sodium chloride injection: 1 | INTRAVENOUS

## 2012-07-20 MED ADMIN — enoxaparin 40 mg/0.4 mL subcutaneous syringe: 40 mg | SUBCUTANEOUS | NDC 00075062040

## 2012-07-20 MED ADMIN — diphenhydrAMINE 50 mg/mL injection solution: 25 mg | INTRAVENOUS | NDC 63323066401

## 2012-07-20 MED ADMIN — sodium chloride 0.9 % (flush) injection syringe: 10 mL | NDC 08290306547

## 2012-07-20 MED ADMIN — ondansetron HCl (PF) 4 mg/2 mL injection solution: 4 mg | INTRAVENOUS | NDC 00641607801

## 2012-07-20 MED ADMIN — sodium chloride 0.9 % (flush) injection syringe: 0 mL

## 2012-07-20 MED ADMIN — HYDROmorphone 50 mg/50 mL in 0.9 % sodium chloride injection: 1 | INTRAVENOUS | NDC 61553024296

## 2012-07-20 MED FILL — parenteral amino acid 10 % combination no.6 intravenous solution: INTRAVENOUS | Qty: 950 | Status: AC

## 2012-07-20 MED FILL — fentaNYL (PF) 50 mcg/mL injection solution: INTRAMUSCULAR | Qty: 15 | Status: AC

## 2012-07-20 NOTE — Nurses Notes (Signed)
Patient ambulated to recliner with assist of 1-2. She did very well. She was bathed and participated in self care. She states "im determined." After ambulating to chair she is complaining of increased pain and asking for the extra medicine the doctor told her she could have. Paged Surg Onc PA who is to place orders. Will continue to monitor closely.

## 2012-07-20 NOTE — Nurses Notes (Signed)
 Patient continues to complain of 10/10 pain and states that she can not breath, patient also requesting that the Epidural be removed because it is hurting her. Patient reminded that the epidural is providing her pain medication. O2 saturations 100% on 1 L, Notified Dr Almeda. Surgery team will be rounding shortly.

## 2012-07-20 NOTE — Care Plan (Signed)
Problem: General Plan of Care(Adult,OB)  Goal: Plan of Care Review(Adult,OB)  The patient and/or their representative will communicate an understanding of their plan of care   Outcome: Ongoing (see interventions/notes)  Recommend :   1. TPN at goal and meeting 100% of estimated needs.  2. Will add IL to TPN today,to prevent EFAD. New Goal TPN will provide 1730 cal (334 cal/kg), 95 g prot (2 g/kg), 1220 dextrose cal (GIR 4.8), and 130 IL calories (8% of total calories) at ~ 74 ml/hr.  3. Continue to monitor electrolytes, blood sugars, Mg++, Phos while on TPN.  4. Monitor daily weights.  5. Will follow.

## 2012-07-20 NOTE — Nurses Notes (Signed)
Patient appears much more comfortable at this time. She states her pain is much better. She appears more relaxed. Her breathing is more even and unlabored. Educated patient and family on PCA use/safety. Will continue to monitor closely.

## 2012-07-20 NOTE — Nurses Notes (Signed)
Patient states that she is having chest pain, heart palpitations and shortness of breath. Notified Dr Georgianne Fick, will order cardiac workup. BP 168/107, HR 114, O2 100%.

## 2012-07-20 NOTE — Progress Notes (Addendum)
[facility]                                                  SURG-ONCOLOGY PROGRESS NOTE    Michele Mason, Michele Mason, 37 y.o. female  Date of Admission:  07/09/2012  Date of Birth:  May 25, 1976  Date of Service:  07/20/2012    Post Op Day: 1 Day Post-Op S/P Procedure(s) (LRB):  GASTRECTOMY (N/A)  REVISION SCAR ABDOMINAL (N/A)    Subjective: Patient stating that she has been in pain throughout the entire night and the only thing that helped was when she received a lidocaine bolus through her epidural. She rates her pain a 10/10 and unremitting. She denies nausea, emesis, chest pain, but states that the pain is making it difficult for her to breathe.     Vital Signs:  Temp (24hrs) Max:36.9 C (98.4 F)      Systolic (24hrs), Avg:138 mmHg, Min:104 mmHg, Max:170 mmHg    Diastolic (24hrs), Avg:85 mmHg, Min:64 mmHg, Max:108 mmHg    Temp  Avg: 36.7 C (98.1 F)  Min: 36.5 C (97.7 F)  Max: 36.9 C (98.4 F)  Pulse  Avg: 99.6  Min: 85  Max: 121  Resp  Avg: 19.4  Min: 12  Max: 26  SpO2  Avg: 98.3 %  Min: 80 %  Max: 100 %  MAP (Non-Invasive)  Avg: 103.6 mmHG  Min: 75 mmHG  Max: 132 mmHG  Pain Score (Numeric, Faces): 10    Current Medications:    Current Facility-Administered Medications:  adult custom parenteral nutrition  Intravenous Continuous   adult custom parenteral nutrition  Intravenous Continuous   benzocaine 7.5% oral gel  Mucous Membrane 6x/day PRN   diphenhydrAMINE (BENADRYL) 50 mg/mL injection 25 mg Intravenous Q6H PRN   enoxaparin (LOVENOX) 40 mg/0.4 mL SubQ injection 40 mg Subcutaneous Q24H   esomeprazole (NEXIUM) injection 40 mg Intravenous Daily   heparin flush (HEPFLUSH) 10 units/mL injection 2-6 mL Intracatheter Q8HRS   heparin flush (HEPFLUSH) 10 units/mL injection 2 mL Intracatheter Q1 MIN PRN   HYDROmorphone (DILAUDID) 1 mg/mL (tot vol 100 mL) in NS PCA 1 Bag Intravenous Q1H PRN   LR premix infusion  Intravenous Continuous   morphine 2 mg/mL injection 2 mg Intravenous Q1H PRN    NS flush syringe 10-30 mL Intracatheter Q8HRS   NS flush syringe 20-30 mL Intracatheter Q1 MIN PRN   ondansetron (ZOFRAN) 2 mg/mL injection 4 mg Intravenous Q6H PRN   ondansetron (ZOFRAN) 2 mg/mL injection 4 mg Intravenous Q6H PRN   promethazine (PHENERGAN) 25 mg/mL injection 25 mg Intramuscular Q6H PRN   ropivacaine PF (NAROPIN) 0.2% (tot vol ) epidural infusion  Epidural Continuous       Today's Physical Exam:  General: no distress and appears in significant discomfort  Lungs: Clear to auscultation bilaterally.   Cardiovascular: regular rate and rhythm     tachycardic to 100s  Abdomen: non-distended, soft, appropriately tender to palpation, midline incision dressing non-saturated, will remove tomorrow, bowel sounds normal  Extremities: No cyanosis or edema  Neurologic: Alert and oriented x3    I/O:  I/O last 24 hours:    Intake/Output Summary (Last 24 hours) at 07/20/12 0858  Last data filed at 07/20/12 0646   Gross per 24 hour   Intake   5311 ml   Output   4485 ml  Net    826 ml     I/O current shift:       Prophylaxis:  Date Started Date Completed   DVT/PE  Lovenox and SCDs/ Venodynes     GI Nexium, Zofran         Nutrition/Residuals:  NPO    Labs   Reviewed:   Recent Labs      07/18/12   0318  07/19/12   0200  07/19/12   1039  07/19/12   1315  07/20/12   0035   SODIUM  135*  138  135*  135*  133*   POTASSIUM  4.2  4.6   --    --   4.0   CHLORIDE  104  105  108  107  105   BICARBONATE   --    --   23.4  22.7   --    BUN  13  14   --    --   16   CREATININE  0.67  0.66   --    --   0.65   GLUCOSE   --    --   195*  242*   --    ANIONGAP  4*  5   --    --   4*   CALCIUM  8.6  9.2   --    --   8.4*   MAGNESIUM  1.9   --    --    --    --    PHOSPHORUS  2.4   --    --    --    --    WBC  5.6  7.5   --    --   16.9*   HGB  7.9*  10.9*   --    --   9.2*   HCT  24.8*  33.4*   --    --   28.1*   PLTCNT  128*  168   --    --   146     Ordered:  None    Diagnostic Tests   Reviewed: N/A  Ordered:  None     Radiology Tests  Reviewed: N/A  Ordered:  None    Assessment/ Plan:   Active Hospital Problems   (*Primary Problem)    Diagnosis   . *Gastric cancer   . Moderate protein-calorie malnutrition   . Chronic pain disorder   . Sarcoidosis     Pain: Anesthesia to bedside, modified epidural infusion (Ropivacaine, discontinued Fentanyl); Initiated Dilaudid PCA, patient receiving 0.4 bolus with 0.3 continuous on PCA secondary to pain resource recommendation  Continue NPO diet at this time, renewed TPN  Continue Zofran 4 mg and Phenergan 25 mg for nausea   Continue daily labs: CBC, BMP  Encouraged aggressive IS usage  Activity: Up and OOB as tolerated  Continue step-down status at least 1 more day  Dispo: Home discharge when medically stable      Nsisong Sheran Fava, MD 07/20/2012, 8:58 AM    I saw and examined this patient with the resident above.  Please see the resident's note, which I have carefully reviewed, for full details. I agree with the findings and plan of care as documented in the resident's note.  Any additions/exceptions are edited/noted.    Had significant pain overnight, much improved with local only via the epidural.  Her anxiety over pain continues to be a significant factor in improving her care.  At the  moment, she has the following pain control medications: lidocaine epidural, dilaudid PCA, intermittent IV dilaudid, IV tylenol, and IV Toradol.  Despite all of this, she continues to complain of pain of 8 to 10 on a 10 point scale.    On exam, tachycardic, hypertensive, making excellent urine.  Abdomen soft, nondistended, appropriately tender at the incision, drain serous.  Labs notable for post-op leukocytosis, Hgb 9.2.  Plan for OOB, ambulation - pt explained in detail how important deep breathing and ambulation is to her overall recovery.  Keep NGT.  Greatly appreciate anesthesia and pain team recommendations.     Pathology returned today - T3 N0 (0/7 lymph nodes).  I have asked pathology to relook at her specimen as with the extent of her resection I greatly anticipated a higher total of lymph nodes.  Will plan for medical oncology as an outpatient.  Port to be placed before discharge home from this hospitalization.    [name], MD 07/20/2012 4:16 PM  Assistant Professor, Division of Surgical Oncology  [facility] Department of Surgery

## 2012-07-20 NOTE — Progress Notes (Addendum)
[facility]  ACUTE PAIN SERVICE PROGRESS NOTE    DATE OF SERVICE:  07/20/2012  DIAGNOSIS:  Abdominal pain s/p ex lap      VITALS:   Temperature: 36.8 C (98.2 F)  Heart Rate: 104  BP (Non-Invasive): 168/86 mmHg  Respiratory Rate: 24  SpO2-1: 80 %  Pain Score (Numeric, Faces): 10    MEDICATION:  Ropiv 0.1% + Fent               INFUSION RATE:  12 cc/hr    CATHETER SITE:  Epidural    SITE ASSESSMENT:  clean, dry and intact    SUBJECTIVE:  The patient is not comfortable.  She says that she has been in pain all night and can't sleep.  She has a dilaudid PCA which isn't giving her much relief.  Overnight her epidural was bolused with lidocaine and that was the only time she had relief all night long.  She is tearful and emotional.  She feels pinprick over her entire abdomen and legs.  She is able to move her legs and feet without complication.  She has an NG in place.    PLAN:  Bolused the patient with 10 cc of Ropiv 0.2%.  Will change epidural infusion to Ropiv 0.2% without narcotic at a rate of 12 cc/hr in an attempt to raise her level of anesthesia from this epidural.  Discussed plan with primary team, suggested they give her a basal PCA rate along with boluses.  Will follow up with her later today.      [name], MD 07/20/2012, 10:19 AM      I saw and examined the patient.  I reviewed the resident's note.  I agree with the findings and plan of care as documented in the resident's note.  Any exceptions/additions are edited/noted.    Gomer France Valora Corporal, MD 07/20/2012, 11:24 AM

## 2012-07-20 NOTE — Care Plan (Signed)
Problem: General Plan of Care(Adult,OB)  Goal: Plan of Care Review(Adult,OB)  The patient and/or their representative will communicate an understanding of their plan of care   Outcome: Ongoing (see interventions/notes)  POC reviewed. Pain has been a major issue this shift. Her pain management has gotten progressively BETTER as this shift has went on. At 1600 assessment she stated she was feeling very good, rated her pain 7/10. She then stood to bathed, ambulated to the recliner chair and her pain then became 10/10. Patient medicated according to orders. She has complained of some right neck pain/stiffness. Family providing massage and heat wrap provided. Patient anxious "Is the cancer in my spine?" Emotional support provided, RN explained while I am unsure where cancer is it is likely she is stiff from being in the same position on the OR table and then being in our bed, our pillows, etc. Patient seemed to relax some after time spent talking. Epidural is also infusing as ordered. VSS. BP has improved greatly through out the day with the most recent being 129/82. Will continue to monitor closely.

## 2012-07-20 NOTE — Nurses Notes (Signed)
Patient c/o pain, "sharp/stabbing/like a screw driver" to her right shoulder. She is worried it is cancer. Her and her family are requesting a CT scan to calm her anxieties. I notified Dr. Langston Masker. Will continue to monitor closely.

## 2012-07-20 NOTE — Nurses Notes (Signed)
 Notified Dr. Ipka that the patient is complaining of 10/10 pain after pca hook up and received 3 doses. The patient is extremely restless and anxious. She is wiggling in the bed, twisting and turning. I explained to her that twisting her abdomen probably is not helping her surgical pain. She is complaining of intolerable pain. Dr. Ipka to come to bedside to assess patient. Will continue to monitor closely.

## 2012-07-20 NOTE — Nurses Notes (Signed)
1950- Patient admitted to 8 NE bed 9 from PACU. Vitals were stable upon admission, pain was major complaints, epidural infusing and prn Dilaudid available. Family at bedside. LR, TPN infusing through 15g EJ and RUR PICC. Will continue to closely monitor patient.

## 2012-07-20 NOTE — Consults (Addendum)
 [facility]  [employer]  Pain Resource Team  Brief Follow Up    S:  - Patient had surgery/gastrectomy yesterday. Complaining of 10/10 pain this morning. IV Morphine  2 mg IV Q1H PRN offered over night, PCA Dilaudid  0.2 mg Q8min lockout started this morning. Her abdominal pain makes her feel like she is unable to breathe. She also complains of a cramping/spasming type of pain. Her back and lower abdominal pain were more bothersome than her epigastric pain this morning, but has improved after epidural increased.     Pain regimen prior to surgery: MSContin 30 mg BID along with PCA of Dilaudid  0.4 mg dose, lockout.    Recommendations:  Recommended to Dr. Almeda to increase Dilaudid  PCA settings this morning: Ms. Cleotilde, Dr. Almeda, Katie (RN) and I were at bedside to discuss changes and plan of care for pain. Anesthesia was at bedside previously to change epidural settings.   0900-Consider 0.3 mg continuous infusion, 0.4 mg dose w/ 8 min lockout.     - 0.3 mg/hr will replace total average 24H opioid intake prior to surgery   - Increase PCA dose by 100% for severe uncontrolled pain.    - will continue to monitor closely and ajdust as needed.    1040-  Back pain and lower abdominal pain resolved, pain is currently in LUQ and epigastric area. Reports pain has at best been 7/10, and is currently close to 10/10 again. Has received total of 5.8 mg IV Dilaudid  at this time.  - Spoke to Dr. Almeda and recommended increasing PCA Dilaudid  bolus dose to 0.5 mg w/ 8 min lockout.     - This is a 25% increase in bolus dose.    - Also asked RN to administer IV Acetaminophen  for multimodal analgesia.     1330 -- Patients dose has not been changed by RN, remaining at 0.3 continuous and 0.4 mg bolus dose with 8 min lockout. Reports much better relief. Will continue at this rate.     Will continue to monitor closely.  Please call [phone] with questions or needs, thank you.    [name], [facility] 07/20/2012, 8:29 AM    I reviewed  the NP's note.  I agree with the findings and plan of care as documented in the NP's note.  Any exceptions/additions are edited/noted.    Belvie Hoit, MD 07/22/2012

## 2012-07-20 NOTE — Care Plan (Signed)
 Problem: General Plan of Care(Adult,OB)  Goal: Plan of Care Review(Adult,OB)  The patient and/or their representative will communicate an understanding of their plan of care   Outcome: Ongoing (see interventions/notes)  Patient s/p Exploratory laparotomy Lysis of adhesions, Prior abdominal scar excision, Distal gastrectomy, D2 Lymphadenectomy,and Umbilical hernia repair. Patient's main complain during the night has been pain control. Patient's pain has not been controlled at all during the shift, pain medications have been changed from Dilaudid  to Morphine  per patient's request, Anesthesia came to patient's bedside and bolused and increased Epidural rate, patient still continues to have severe c/o pain. Patient has been hypertensive with systolic and diastolic pressures elevated. Nausea controlled with prn Zofran  and Phenergan . Bowel sounds are very hypotensive. Patient has severe anxiety d/t current diagnosis of gastric ca and pain. No medications are ordered at this time for anxiety. Continue to attempt to console patient and medicate with pain medications. Pt remains NPO for bowel rest. D/c home when medically ready. POC discussed with patient and father at bedside. Patient continues to ask for increased pain medications and specifically Lidocaine  IV.

## 2012-07-20 NOTE — Nurses Notes (Signed)
Patient continues to c/o severe 10/10 pain without relief, despite changing pain medication from Dilaudid to Morphine per patient's request. Multiple calls made to Dr Georgianne Fick Surg Onc about pain control. Paged Dr Aundria Rud of Anesthesia. Dr came to see patient, bolused with 5ml via Epidural, changed rate to 110ml/hour from 44ml/hr. Will continue to closely monitor pressures. Patient currently hypertensive, 167/107, heart rate 110. Dr Georgianne Fick aware of this.

## 2012-07-21 LAB — CBC/DIFF
BASOPHILS: 0 %
BASOS ABS: 0.033 THOU/uL (ref 0.0–0.2)
EOS ABS: 0.03 THOU/uL (ref 0.0–0.5)
EOSINOPHIL: 0 %
HCT: 25.3 % — ABNORMAL LOW (ref 33.5–45.2)
HGB: 8.3 g/dL — ABNORMAL LOW (ref 11.2–15.2)
LYMPHOCYTES: 8 %
LYMPHS ABS: 0.868 THOU/uL — ABNORMAL LOW (ref 1.0–4.8)
MCH: 28.5 pg (ref 27.4–33.0)
MCHC: 32.7 g/dL (ref 32.5–35.8)
MCV: 87.1 fL (ref 78–100)
MONOCYTES: 9 %
MONOS ABS: 1.045 THOU/uL — ABNORMAL HIGH (ref 0.3–1.0)
MPV: 9.5 fL (ref 7.5–11.5)
PLATELET COUNT: 126 10*3/uL — ABNORMAL LOW (ref 140–450)
PMN ABS: 9.653 THOU/uL — ABNORMAL HIGH (ref 1.5–7.7)
PMN'S: 83 %
RBC: 2.9 MIL/uL — ABNORMAL LOW (ref 3.63–4.92)
RDW: 15.6 % — ABNORMAL HIGH (ref 12.0–15.0)
WBC: 11.6 10*3/uL — ABNORMAL HIGH (ref 3.5–11.0)

## 2012-07-21 LAB — PERFORM POC WHOLE BLOOD GLUCOSE
GLUCOSE, POINT OF CARE: 150 mg/dL — ABNORMAL HIGH (ref 70–105)
GLUCOSE, POINT OF CARE: 128 mg/dL — ABNORMAL HIGH (ref 70–105)

## 2012-07-21 LAB — BASIC METABOLIC PANEL, FASTING
ANION GAP: 3 mmol/L — CL (ref 5–16)
BUN/CREAT RATIO: 30 — ABNORMAL HIGH (ref 6–22)
BUN: 16 mg/dL (ref 6–20)
CALCIUM: 8.2 mg/dL — ABNORMAL LOW (ref 8.5–10.4)
CARBON DIOXIDE: 25 mmol/L (ref 22–32)
CHLORIDE: 108 mmol/L (ref 96–111)
CREATININE: 0.53 mg/dL (ref 0.49–1.10)
ESTIMATED GLOMERULAR FILTRATION RATE: >59 mL/min/{1.73_m2} (ref >59)
GLUCOSE, FASTING: 151 mg/dL — ABNORMAL HIGH (ref 70–105)
POTASSIUM: 4 mmol/L (ref 3.5–5.1)
SODIUM: 136 mmol/L (ref 136–145)

## 2012-07-21 LAB — HISTORICAL SURGICAL PATHOLOGY SPECIMEN

## 2012-07-21 MED ADMIN — enoxaparin 40 mg/0.4 mL subcutaneous syringe: 40 mg | SUBCUTANEOUS | NDC 00075062040

## 2012-07-21 MED ADMIN — heparin lock flush (porcine) 10 unit/mL intravenous solution: 0

## 2012-07-21 MED ADMIN — sodium chloride 0.9 % (flush) injection syringe: 10 mL | NDC 08290306547

## 2012-07-21 MED ADMIN — ondansetron HCl (PF) 4 mg/2 mL injection solution: 4 mg | INTRAVENOUS | NDC 00641607801

## 2012-07-21 MED ADMIN — sodium chloride 0.9 % (flush) injection syringe: 0 mL

## 2012-07-21 MED ADMIN — heparin lock flush (porcine) 10 unit/mL intravenous solution: 5 mL | NDC 6425322235

## 2012-07-21 MED FILL — ROPivacaine (PF) 5 mg/mL (0.5 %) injection solution: Qty: 200 | Status: AC

## 2012-07-21 MED FILL — parenteral amino acid 10 % combination no.6 intravenous solution: INTRAVENOUS | Qty: 950 | Status: AC

## 2012-07-21 NOTE — Nurses Notes (Signed)
Patient up to bedside commode. Voided 350 clear yellow urine

## 2012-07-21 NOTE — Care Plan (Signed)
Problem: General Plan of Care(Adult,OB)  Goal: Plan of Care Review(Adult,OB)  The patient and/or their representative will communicate an understanding of their plan of care   Outcome: Ongoing (see interventions/notes)  POC reviewed. Patient alert and oriented. Pain in her abdomen has been minimal this shift, she has mostly complained of left leg discomfort/pain/numbness and shoulder pain. She has been up OOB twice. She ambulated around the bed with asssit of 1. Walking is difficult due to left leg but she is tolerated fairly well. Her mood varies from determined and hopefull to anxious and sad. Her foley was removed this shift and she has since voided. She is still asking for ativan. Fall and Skin precautions in place. Will continue to monitor closely.

## 2012-07-21 NOTE — CDI REVIEW (Signed)
Admissions, Findings, and Consultations:  2/4 Ortho: DeQuervain tenosynovitis LUE, CTS LUE  2/4 Surg Onc: Moderate protein-calorie malnutrition   2/5 Surg Onc: Consent in chart for distal gastrectomy, D2 lymphadenectomyy, and any associated surgeries   2/5 Post-op: /p distal gastrectomy with gastrojejunostomy  2/6 Surg Onc: same   Diagnostics and Results:   Procedures and Treatments:  2/5 OP [place]: Gastric cancer. 2. Sarcoidosis.  1. Exploratory laparotomy. 2. Lysis of adhesions. 3. Prior abdominal scar excision.   4. Distal gastrectomy. 5. D2 lymphadenectomy. 6. Umbilical hernia repair     Meds, IV's, Rx Blood:   Comments:

## 2012-07-21 NOTE — Consults (Addendum)
[facility]  Pain Resource Team  Follow Up    Michele Mason  [id number]  July 16, 1975    Date of Service:  07/21/2012  Requesting MD: Surg Onc  Chief Complaint/Reason for consult:  Pain control      Subjective: Pt is complaining of L leg discomfort this morning (numbness, can't move it) that started yesterday afternoon. Acute Pain/Anesthesia saw her this morning and offered to decrease epidural, but informed her this would likely lead to more pain and need for increased usage of PCA. She also has likely muscular pain in her neck and shoulders, sore and achy. She is using ice and heat intermittently for this, which is helping.     She does continue to have some abdominal pain, but mainly complains of discomfort in L leg and says that everything else is fine.     During visit, she had been complaining of chest tightness and "squeezing." She feels like she can't breathe (O2 sats>95%). She is using her IS. Dr. Marsa Aris was made aware of this by RN.     Objective: Sitting in chair, appears moderately uncomfortable, emotionally distressed this morning. Alert, not sedated.  Temperature: 36.8 C (98.2 F)  Heart Rate: 93  BP (Non-Invasive): 128/74 mmHg  Respiratory Rate: 18  SpO2-1: 100 %      Current analgesic regimen:  Epidural: Ropiv 12cc/hr  PCA: Dilaudid 0.3 mg continuous w/ 0.4 mg dose, 8 min lockout.   Clinician dose: 0.4 mg Q4PRN.   IV Ofirmev 650 mg Q6H   Toradol 30 mg Q8H    Assessment/Recommendations:  - Pt discussed options w/ epidural w/ Anesthesia, and wants to discuss with Dr. Leota Sauers.    - I also spoke to Dr. Alvie Heidelberg. Likely will continue current settings for better anesthesia.    - Recommend to continue with current PCA settings, as pt has expressed better controlled post-surgical pain.    - Continue IV Ofirmev for added analgesia.    - If pain increases or becomes uncontrolled, consider increasing PRN clinician dose of IV Dilaudid to 0.6 mg IV Q2H PRN.    - For continued severe uncontrolled pain, clinician dose should be twice that of continuous dose available Q1-2H PRN.   - Over the weekend may titrate up or down as necessary.     - Continue topical heat/ice PRN for neck/back discomfort.    - May consider muscle relaxant once epidural removed this weekend.     - Will continue to follow. Please call [phone] with any questions or needs.     Lanetta Inch, [facility] 07/21/2012, 11:20 AM    I reviewed the NP's note.  I agree with the findings and plan of care as documented in the NP's note.  Any exceptions/additions are edited/noted.    Lauretta Chester, MD 07/22/2012

## 2012-07-21 NOTE — Nurses Notes (Signed)
 Foley dc'd per Md order.

## 2012-07-21 NOTE — Progress Notes (Addendum)
[facility]  ACUTE PAIN SERVICE PROGRESS NOTE    DATE OF SERVICE:  07/21/2012  DIAGNOSIS:  Abdominal pain s/p ex lap      VITALS:   Temperature: 36.8 C (98.2 F)  Heart Rate: 93  BP (Non-Invasive): 128/74 mmHg  Respiratory Rate: 18  SpO2-1: 100 %  Pain Score (Numeric, Faces): 3    MEDICATION:  Ropiv 0.2%              INFUSION RATE:  12 cc/hr  MEDICATION: Dilaudid 1mg /ml INFUSION RATE: PCA basal rate of 0.3mg /hr with 0.4mg  bolus every 8 minutes prn    CATHETER SITE:  Epidural    SITE ASSESSMENT:  clean, dry and intact    SUBJECTIVE:  Patient much more satisfied with pain level today. Rates pain at 3/10 and is comfortable with this level. She is using her PCA button frequently but does not appear somnolent. Over the last 12 hours she had ~2 mg Dilaudid per hour.    PLAN:  No change in medications or rates at this time. Will plan on removing epidural over the weekend. Please hold AM dose of Lovenox until patient has been seen by Acute Pain Service tomorrow.    Reita Chard, MD 07/21/2012, 9:11 AM  Resident CA-1, PGY-2   Department of Anesthesiology      I saw and examined the patient.  I reviewed the resident's note.  I agree with the findings and plan of care as documented in the resident's note.  Any exceptions/additions are edited/noted.    Joshua Soulier Valora Corporal, MD 07/21/2012, 1:19 PM

## 2012-07-21 NOTE — Care Management Notes (Signed)
[facility]  Care Management Note    Patient Name: Michele Mason  Date of Birth: 02-28-1976  Sex: female  Date/Time of Admission: 07/09/2012  1:37 PM  Room/Bed: 8NE/09  Payor: [place] MEDICAID  Plan: [facility] MEDICAID  Product Type: Medicaid     LOS: 12 days   PCP: No Established Pcp    Admitting Diagnosis:  gastric mass  gastric tumor  gastic cancer    Assessment:   PATIENT 37 YEAR OLD FEMALE WITH C/O POST PAIN, AND ACHYNESS. MANAGEMENT PAIN CONTROL, IVMF, COMFORT MEASURES FOR ACHES. NO NEEDS IDENTIFIED. WILL CONTINUE TO FOLLOW.    Discharge Plan:  Home(Patient/Family Member/other) (code 1)  WHEN MEDICALLY STABLE WILL DISCHARGE HOME WITH FAMILY. TRANSPORTATION AVAILABLE.    The patient will continue to be evaluated for developing discharge needs.     Case Manager: Harriet Masson, RN 07/21/2012, 2:34 PM  Phone: [phone]

## 2012-07-21 NOTE — Care Plan (Signed)
Problem: General Plan of Care(Adult,OB)  Goal: Plan of Care Review(Adult,OB)  The patient and/or their representative will communicate an understanding of their plan of care   Outcome: Ongoing (see interventions/notes)  WHEN MEDICALLY STABLE WILL DISCHARGE HOME WITH FAMILY. TRANSPORTATION AVAILABLE.    The patient will continue to be evaluated for developing discharge needs.     Case Manager: Harriet Masson, RN 07/21/2012, 2:34 PM  Phone: [phone]

## 2012-07-21 NOTE — Nurses Notes (Signed)
 Pt c/o chest pain after ambulating to the chair. She also continues to complain of severe discomfort in LLE. VSS. Notifed Dr. Glena of chest pain. Will continue to monitor closely.

## 2012-07-21 NOTE — Care Plan (Signed)
Problem: General Plan of Care(Adult,OB)  Goal: Plan of Care Review(Adult,OB)  The patient and/or their representative will communicate an understanding of their plan of care   Outcome: Ongoing (see interventions/notes)  Patient had a more restful night than the previous night, pain to abdomen stayed less that 5, main complaint of pain was the right and left shoulder area, ice and repositioning helped. Patient also had several complaints of shortness of breath during the shift, saturations were always greater than 95%.  Spoke with patient and attempted to calm several times. Patient understands it is important for her to try and stay calm and focus on breathing,. Continue with pain medication and epidural, TPN, and increase oob activity today. Patient aware of plan of care.

## 2012-07-22 LAB — AMYLASE BODY FLUID: AMYLASE, FLUID: 18 U/L

## 2012-07-22 LAB — CBC/DIFF
BASOPHILS: 0 %
BASOS ABS: 0.028 10*3/uL (ref 0.0–0.2)
EOS ABS: 0.168 10*3/uL (ref 0.0–0.5)
EOSINOPHIL: 2 %
HCT: 25.5 % — ABNORMAL LOW (ref 33.5–45.2)
HGB: 8.4 g/dL — ABNORMAL LOW (ref 11.2–15.2)
LYMPHOCYTES: 13 %
LYMPHS ABS: 0.94 THOU/uL — ABNORMAL LOW (ref 1.0–4.8)
MCH: 28.8 pg (ref 27.4–33.0)
MCHC: 32.9 g/dL (ref 32.5–35.8)
MCV: 87.4 fL (ref 78–100)
MONOCYTES: 12 %
MONOS ABS: 0.902 THOU/uL (ref 0.3–1.0)
MPV: 9.2 fL (ref 7.5–11.5)
PLATELET COUNT: 146 10*3/uL (ref 140–450)
PMN ABS: 5.499 10*3/uL (ref 1.5–7.7)
PMN'S: 73 %
RBC: 2.92 MIL/uL — ABNORMAL LOW (ref 3.63–4.92)
RDW: 15.5 % — ABNORMAL HIGH (ref 12.0–15.0)
WBC: 7.5 THOU/uL (ref 3.5–11.0)

## 2012-07-22 LAB — BASIC METABOLIC PANEL, FASTING
ANION GAP: 5 mmol/L (ref 5–16)
BUN/CREAT RATIO: 35 — ABNORMAL HIGH (ref 6–22)
BUN: 17 mg/dL (ref 6–20)
CALCIUM: 8.6 mg/dL (ref 8.5–10.4)
CARBON DIOXIDE: 24 mmol/L (ref 22–32)
CHLORIDE: 107 mmol/L (ref 96–111)
CREATININE: 0.48 mg/dL — ABNORMAL LOW (ref 0.49–1.10)
ESTIMATED GLOMERULAR FILTRATION RATE: >59 mL/min/{1.73_m2} (ref >59)
GLUCOSE, FASTING: 134 mg/dL — ABNORMAL HIGH (ref 70–105)
POTASSIUM: 3.9 mmol/L (ref 3.5–5.1)
SODIUM: 136 mmol/L (ref 136–145)

## 2012-07-22 LAB — PERFORM POC WHOLE BLOOD GLUCOSE
GLUCOSE, POINT OF CARE: 121 mg/dL — ABNORMAL HIGH (ref 70–105)
GLUCOSE, POINT OF CARE: 129 mg/dL — ABNORMAL HIGH (ref 70–105)
GLUCOSE, POINT OF CARE: 140 mg/dL — ABNORMAL HIGH (ref 70–105)

## 2012-07-22 MED ORDER — ENOXAPARIN 40 MG/0.4 ML SUBCUTANEOUS SYRINGE
40.0000 mg | INJECTION | SUBCUTANEOUS | Status: DC
Start: 2012-07-22 — End: 2012-08-01
  Administered 2012-07-22 – 2012-07-27 (×6): 40 mg via SUBCUTANEOUS
  Administered 2012-07-28 – 2012-08-01 (×5): 0 mg via SUBCUTANEOUS
  Filled 2012-07-22 (×10): qty 0.4

## 2012-07-22 MED ORDER — PHENOL 1.4 % MUCOSAL AEROSOL SPRAY
1.00 | INHALATION_SPRAY | Status: DC | PRN
Start: 2012-07-22 — End: 2012-07-25
  Administered 2012-07-22 – 2012-07-23 (×2): 1 via OROMUCOSAL
  Filled 2012-07-22: qty 177

## 2012-07-22 MED ADMIN — heparin lock flush (porcine) 10 unit/mL intravenous solution: 5 mL | NDC 6425322235

## 2012-07-22 MED ADMIN — enoxaparin 40 mg/0.4 mL subcutaneous syringe: 0 mg | SUBCUTANEOUS

## 2012-07-22 MED ADMIN — sodium chloride 0.9 % (flush) injection syringe: 10 mL | NDC 08290306547

## 2012-07-22 MED ADMIN — heparin lock flush (porcine) 10 unit/mL intravenous solution: 0 mL

## 2012-07-22 MED ADMIN — sodium chloride 0.9 % (flush) injection syringe: 20 mL | NDC 08290306547

## 2012-07-22 MED ADMIN — ondansetron HCl (PF) 4 mg/2 mL injection solution: 4 mg | INTRAVENOUS | NDC 00641607801

## 2012-07-22 MED ADMIN — HYDROmorphone 50 mg/50 mL in 0.9 % sodium chloride injection: 1 | INTRAVENOUS | NDC 61553024296

## 2012-07-22 MED FILL — parenteral amino acid 10 % combination no.6 intravenous solution: INTRAVENOUS | Qty: 950 | Status: AC

## 2012-07-22 NOTE — Progress Notes (Addendum)
[facility]  ACUTE PAIN SERVICE PROGRESS NOTE    DATE OF SERVICE:  07/22/2012  DIAGNOSIS:  Abdominal pain, POD#3 distal gastrectomy with RNY gastrojejunostomy      VITALS:   Temperature: 36.9 C (98.4 F)  Heart Rate: 82  BP (Non-Invasive): 152/94 mmHg  Respiratory Rate: 18  SpO2-1: 96 %  Pain Score (Numeric, Faces): 4    MEDICATION:  0.2% ropiv               INFUSION RATE:  19ml/h    CATHETER SITE:  Epidural    SITE ASSESSMENT:  clean, dry and intact    SUBJECTIVE:  Pt was having numbness in left leg yesterday and rate was dec from 12 to 27ml/h.  Now numbness is gone, but rates pain 8/10.  Has been OOB several times.  Has dilaudid PCA with basal rate 0.3mg /h with 0.4mg  q38min.  Wiggles toes.       PLAN:  Initially increased rate to 55ml/h and gave 4ml bolus thru pump.  Then spoke with primary service who said that in anticipation of epidural being pulled tomorrow, they would like to wean it and therefore dec the rate back to 77ml/h (change was made).      Consider d/c the basal rate dilaudid and use a larger demand dose.  She should stay in a monitored unit and on continuous pulse oximetry if she continues to be on a basal PCA rate.    Pt may have lovenox dose this morning, but please hold tomorrow morning's dose in anticipation of epidural removal.        Harle Battiest, MD    Patient seen and evaluated with Dr. Johny Chess.  I agree with her assessment and plan.  Linus Orn, MD  07/22/2012

## 2012-07-22 NOTE — Progress Notes (Signed)
This is a late note.  I saw and examined this patient on 07/21/2012 around 5pm.    Pt is comfortable.  Pain is controlled with epidural, PCA, tylenol.  She has been OOB, walking around the unit, and utilizing the commode/bathroom since foley was discontinued.  She denies bowel function.    On exam, she is still slightly tachy.  Making urine.  Little output from her NG.  Abdomen soft, nondistended, appropriate incisional tenderness, incision intact with staples, drain is serous.    Plan for OOB, ambulate, NG until Monday.  Plan for UGI via NG prior to removal.  Pt needs to be OOB.    I have discussed her pathology reports - T3 N0 lesion (18 lymph nodes examined, all negative for tumor).  We will plan for port early next week.

## 2012-07-22 NOTE — Care Plan (Signed)
 Problem: General Plan of Care(Adult,OB)  Goal: Plan of Care Review(Adult,OB)  The patient and/or their representative will communicate an understanding of their plan of care   Outcome: Ongoing (see interventions/notes)  Plan of care reviewed with the patient and is ongoing at this time. Patient's pain is her main concern at this time. Multiple pharmacological and non-pharmacological measures taken to ensure adequate management. Tolerating OOB activity to the [facility] during the night. Anxiety is also an issue that's evident with calming techniques and emotional support provided. Fall precautions remain.

## 2012-07-22 NOTE — Nurses Notes (Signed)
07/22/12 0429   Vital Signs   Temperature 36.8 C (98.2 F)   Temp Source Oral   Heart Rate 96   Respiratory Rate 12   BP (Non-Invasive) 138/75 mmHg   MAP (Non-Invasive) 99 mmHG   BP Source (Non-Invasive) C;Monitor   Patient Position Supine   Patient complaining of "chest tightness". Denies radiating pain or the pain to be sharp/stabbing. She stated," This is how it always feels, like muscle tightness. I told the Dr.'s before and they said it would do that." Notified service and the above VS were taken. No new orders received.

## 2012-07-22 NOTE — Progress Notes (Addendum)
[facility]                                                  SURG-ONCOLOGY PROGRESS NOTE    Michele Mason, Michele Mason, 37 y.o. female  Date of Admission:  07/09/2012  Date of Birth:  1976-03-31  Date of Service:  07/22/2012    Post Op Day: 3 Days Post-Op S/P Procedure(s) (LRB):  GASTRECTOMY (N/A)  REVISION SCAR ABDOMINAL (N/A)    Subjective: Continues to have pain issues, but much better controlled today than previous days. NGT remains in on LIS. No flatus or BM yet. Complaining of a sore throat. Epidural rate increased last night. No nausea or vomiting.  Has been up out of bed to toilet and in room.    Vital Signs:  Temp (24hrs) Max:36.8 C (98.2 F)      Systolic (24hrs), Avg:141 mmHg, Min:121 mmHg, Max:170 mmHg    Diastolic (24hrs), Avg:85 mmHg, Min:75 mmHg, Max:99 mmHg    Temp  Avg: 36.7 C (98.1 F)  Min: 36.6 C (97.9 F)  Max: 36.8 C (98.2 F)  Pulse  Avg: 97.3  Min: 89  Max: 113  Resp  Avg: 16.8  Min: 12  Max: 20  SpO2  Avg: 94.4 %  Min: 63 %  Max: 100 %  MAP (Non-Invasive)  Avg: 107.2 mmHG  Min: 95 mmHG  Max: 127 mmHG  Pain Score (Numeric, Faces): 9    Current Medications:    Current Facility-Administered Medications:  acetaminophen (OFIRMEV) 10 mg/mL IV 650 mg 650 mg Intravenous Q6HRS   adult custom parenteral nutrition  Intravenous Continuous   adult custom parenteral nutrition  Intravenous Continuous   benzocaine 7.5% oral gel  Mucous Membrane 6x/day PRN   diphenhydrAMINE (BENADRYL) 50 mg/mL injection 25 mg Intravenous Q6H PRN   enoxaparin (LOVENOX) 40 mg/0.4 mL SubQ injection 40 mg Subcutaneous Q24H   esomeprazole (NEXIUM) injection 40 mg Intravenous Daily   heparin flush (HEPFLUSH) 10 units/mL injection 2-6 mL Intracatheter Q8HRS   heparin flush (HEPFLUSH) 10 units/mL injection 2 mL Intracatheter Q1 MIN PRN   HYDROmorphone (DILAUDID) 1 mg/mL (tot vol 100 mL) in NS PCA 1 Bag Intravenous Q1H PRN   HYDROmorphone (DILAUDID) 1 mg/mL injection 0.4 mg Intravenous Q4H PRN    ketorolac (TORADOL) 30 mg/mL injection 30 mg Intravenous Q8HRS   LR premix infusion  Intravenous Continuous   morphine 2 mg/mL injection 2 mg Intravenous Q1H PRN   NS flush syringe 10-30 mL Intracatheter Q8HRS   NS flush syringe 20-30 mL Intracatheter Q1 MIN PRN   ondansetron (ZOFRAN) 2 mg/mL injection 4 mg Intravenous Q6H PRN   phenol (CHLORASEPTIC) 1.4% oromucosal spray 1 Spray Mouth/Throat Q4H PRN   promethazine (PHENERGAN) 25 mg/mL injection 25 mg Intramuscular Q6H PRN   ropivacaine PF (NAROPIN) 0.2% (tot vol ) epidural infusion  Epidural Continuous       Today's Physical Exam:  General: no distress and appears in significant discomfort  Lungs: Clear to auscultation bilaterally.   Cardiovascular: regular rate and rhythm.  tachycardic to 100s  Abdomen: non-distended, soft, appropriately tender to palpation, dressing removed, incision c/d/i. bowel sounds normal  Extremities: No cyanosis or edema  Neurologic: Alert and oriented x3    I/O:  I/O last 24 hours:      Intake/Output Summary (Last 24 hours) at 07/22/12 1126  Last data filed  at 07/22/12 1000   Gross per 24 hour   Intake   2941 ml   Output   2130 ml   Net    811 ml     I/O current shift:  02/08 0800 - 02/08 1559  In: 405 [I.V.:405]  Out: 580 [Urine:300; Drains:30; NG/OG/GT:250]    Prophylaxis:  Date Started Date Completed   DVT/PE  Lovenox and SCDs/ Venodynes     GI Nexium, Zofran         Nutrition/Residuals:  NPO    Labs   Reviewed:   Recent Labs      07/19/12   1315  07/20/12   0035  07/21/12   0047  07/22/12   0032   SODIUM  135*  133*  136  136   POTASSIUM   --   4.0  4.0  3.9   CHLORIDE  107  105  108  107   BICARBONATE  22.7   --    --    --    BUN   --   16  16  17    CREATININE   --   0.65  0.53  0.48*   GLUCOSE  242*   --    --    --    ANIONGAP   --   4*  3*  5   CALCIUM   --   8.4*  8.2*  8.6   WBC   --   16.9*  11.6*  7.5   HGB   --   9.2*  8.3*  8.4*   HCT   --   28.1*  25.3*  25.5*   PLTCNT   --   146  126*  146     Ordered:  None     Diagnostic Tests   Reviewed: N/A  Ordered:  None    Radiology Tests  Reviewed: N/A  Ordered:  None    Assessment/ Plan:   Active Hospital Problems   (*Primary Problem)    Diagnosis   . *Gastric cancer   . Moderate protein-calorie malnutrition   . Chronic pain disorder   . Sarcoidosis     37 yo female s/p distal gastrectomy with RNY Gastrojejunostomy. Tumor T3N0  Continue epidural today, remove tomorrow  Continue PCA  NPO diet at this time, renewed TPN- plan for gastrograffin study monday  Continue Zofran 4 mg and Phenergan 25 mg for nausea   Continue daily labs: CBC, BMP  Lovenox 40 daily  Encouraged aggressive IS usage  Activity: Up and OOB as tolerated, ambulate in hall today  Continue step-down status while on high PCA settings and with epidural    Duanne Moron, MD 07/22/2012, 11:30 AM    I saw and examined this patient with the resident above.  Please see the resident's note, which I have carefully reviewed, for full details. I agree with the findings and plan of care as documented in the resident's note.  Any additions/exceptions are edited/noted.    Looks ok.  Still having pain issues, which I am aware will be chronic.  On exam, HD normal today (much less tachycardic as pain has improved), abdomen soft, nondistended, incision intact with staples, drain serous.  Plan for ambulate, IS (I spent time personally coaching her today, was able to inhale with maximum effort).  I would like to wean the epidural so that it can be removed tomorrow.  Will check drain for amylase (due to lymph node dissection in the  retroperitoneum).  UGI on Monday.    [name], MD 07/22/2012 3:13 PM  Assistant Professor, Division of Surgical Oncology  [facility] Department of Surgery

## 2012-07-22 NOTE — Care Plan (Signed)
 Problem: General Plan of Care(Adult,OB)  Goal: Plan of Care Review(Adult,OB)  The patient and/or their representative will communicate an understanding of their plan of care   Outcome: Ongoing (see interventions/notes)  Plan of care reviewed with patient. NG to right nare remains to LISX pulling clear fluid, patient states minimal nausea, is belching. Abdominal incision OTA, well approximated edges, scant bloody drainage, right blake drain pulling bloody fluid, leaking around insertion site, dressing changed prn. Pain assessed every 4 hours, patient has dilaudid  pca and ropivicaine eipidural infusing, also has scheduled tylenol  and toradol  for pain control. Patient gets up to bsc to void, ambulates well. Fall precautions in place, call bell and frequent use items within reach.  Remains on telemetry with Q 4 vitals.

## 2012-07-23 ENCOUNTER — Encounter (HOSPITAL_COMMUNITY): Payer: Self-pay | Admitting: SURGICAL ONCOLOGY

## 2012-07-23 DIAGNOSIS — Z86711 Personal history of pulmonary embolism: Secondary | ICD-10-CM

## 2012-07-23 DIAGNOSIS — F419 Anxiety disorder, unspecified: Secondary | ICD-10-CM

## 2012-07-23 DIAGNOSIS — M654 Radial styloid tenosynovitis [de Quervain]: Secondary | ICD-10-CM

## 2012-07-23 DIAGNOSIS — D638 Anemia in other chronic diseases classified elsewhere: Secondary | ICD-10-CM

## 2012-07-23 DIAGNOSIS — K5090 Crohn's disease, unspecified, without complications: Secondary | ICD-10-CM

## 2012-07-23 HISTORY — DX: Anemia in other chronic diseases classified elsewhere: D63.8

## 2012-07-23 HISTORY — DX: Personal history of pulmonary embolism: Z86.711

## 2012-07-23 HISTORY — DX: Radial styloid tenosynovitis (de quervain): M65.4

## 2012-07-23 HISTORY — DX: Anxiety disorder, unspecified: F41.9

## 2012-07-23 HISTORY — DX: Crohn's disease, unspecified, without complications (CMS HCC): K50.90

## 2012-07-23 LAB — CBC/DIFF
BASOPHILS: 0 %
BASOS ABS: 0.04 THOU/uL (ref 0.0–0.2)
EOS ABS: 0.342 10*3/uL (ref 0.0–0.5)
EOSINOPHIL: 4 %
HCT: 27.9 % — ABNORMAL LOW (ref 33.5–45.2)
HGB: 9.2 g/dL — ABNORMAL LOW (ref 11.2–15.2)
LYMPHOCYTES: 13 %
LYMPHS ABS: 1.203 10*3/uL (ref 1.0–4.8)
MCH: 28.5 pg (ref 27.4–33.0)
MCHC: 32.9 g/dL (ref 32.5–35.8)
MCV: 86.9 fL (ref 78–100)
MONOCYTES: 13 %
MONOS ABS: 1.23 THOU/uL — ABNORMAL HIGH (ref 0.3–1.0)
MPV: 9 fL (ref 7.5–11.5)
PLATELET COUNT: 179 10*3/uL (ref 140–450)
PMN ABS: 6.783 THOU/uL (ref 1.5–7.7)
PMN'S: 70 %
RBC: 3.21 MIL/uL — ABNORMAL LOW (ref 3.63–4.92)
RDW: 15.1 % — ABNORMAL HIGH (ref 12.0–15.0)
WBC: 9.6 10*3/uL (ref 3.5–11.0)

## 2012-07-23 LAB — BASIC METABOLIC PANEL, FASTING
ANION GAP: 4 mmol/L — ABNORMAL LOW (ref 5–16)
BUN/CREAT RATIO: 29 — ABNORMAL HIGH (ref 6–22)
BUN: 15 mg/dL (ref 6–20)
CALCIUM: 8.9 mg/dL (ref 8.5–10.4)
CARBON DIOXIDE: 25 mmol/L (ref 22–32)
CHLORIDE: 109 mmol/L (ref 96–111)
CREATININE: 0.51 mg/dL (ref 0.49–1.10)
ESTIMATED GLOMERULAR FILTRATION RATE: >59 ml/min/1.73m2 (ref >59)
GLUCOSE, FASTING: 99 mg/dL (ref 70–105)
POTASSIUM: 3.3 mmol/L — ABNORMAL LOW (ref 3.5–5.1)
SODIUM: 138 mmol/L (ref 136–145)

## 2012-07-23 LAB — PERFORM POC WHOLE BLOOD GLUCOSE: GLUCOSE, POINT OF CARE: 114 mg/dL — ABNORMAL HIGH (ref 70–105)

## 2012-07-23 LAB — TRIGLYCERIDE BODY FLUID: TRIGLYCERIDE, FLUID: 45 mg/dL

## 2012-07-23 MED ORDER — POTASSIUM CHLORIDE 10 MEQ/50 ML IN STERILE WATER INTRAVENOUS PIGGYBACK
10.0000 meq | INJECTION | INTRAVENOUS | Status: AC
Start: 2012-07-23 — End: 2012-07-23
  Administered 2012-07-23 (×6): 10 meq via INTRAVENOUS
  Administered 2012-07-23: 0 meq via INTRAVENOUS
  Filled 2012-07-23 (×6): qty 50

## 2012-07-23 MED ORDER — DEXTROSE 5 % IN WATER (D5W) INTRAVENOUS SOLUTION
2.0000 g | Freq: Once | INTRAVENOUS | Status: DC
Start: 2012-07-24 — End: 2012-07-24
  Filled 2012-07-23: qty 20

## 2012-07-23 MED ADMIN — ondansetron HCl (PF) 4 mg/2 mL injection solution: 4 mg | INTRAVENOUS | NDC 00641607801

## 2012-07-23 MED ADMIN — sodium chloride 0.9 % (flush) injection syringe: 20 mL | NDC 08290306547

## 2012-07-23 MED ADMIN — heparin lock flush (porcine) 10 unit/mL intravenous solution: 0 mL

## 2012-07-23 MED ADMIN — heparin lock flush (porcine) 10 unit/mL intravenous solution: 0

## 2012-07-23 MED ADMIN — HYDROmorphone 50 mg/50 mL in 0.9 % sodium chloride injection: 1 | INTRAVENOUS | NDC 61553024296

## 2012-07-23 MED ADMIN — HYDROmorphone 50 mg/50 mL in 0.9 % sodium chloride injection: 1 | INTRAVENOUS

## 2012-07-23 MED ADMIN — heparin lock flush (porcine) 10 unit/mL intravenous solution: 5 mL | NDC 6425322235

## 2012-07-23 MED ADMIN — sodium chloride 0.9 % (flush) injection syringe: 10 mL | NDC 08290306547

## 2012-07-23 MED ADMIN — sodium chloride 0.9 % (flush) injection syringe: 0 mL

## 2012-07-23 MED FILL — HYDROmorphone 50 mg/50 mL in 0.9 % sodium chloride injection: 1.0000 | INTRAMUSCULAR | Qty: 1 | Status: AC

## 2012-07-23 MED FILL — parenteral amino acid 10 % combination no.6 intravenous solution: INTRAVENOUS | Qty: 950 | Status: AC

## 2012-07-23 NOTE — Progress Notes (Addendum)
[facility]  ACUTE PAIN SERVICE PROGRESS NOTE    DATE OF SERVICE:  07/23/2012  DIAGNOSIS:  Abdominal pain, POD#4 distal gastrectomy with RNY gastrojejunostomy      VITALS:   Temperature: 37.1 C (98.8 F)  Heart Rate: 103  BP (Non-Invasive): 147/78 mmHg  Respiratory Rate: 15  SpO2-1: 100 %  Pain Score (Numeric, Faces): 7    MEDICATION:  0.2% ropiv               INFUSION RATE:  39ml/h    CATHETER SITE:  Epidural    SITE ASSESSMENT:  clean, dry and intact    SUBJECTIVE:  Rates current pain 7/10.  Has been able to regularly get OOB to chair and ambulate.  After speaking with primary service yesterday, rate was left at 6ml/h in anticipation of removing catheter today.  Last lovenox was 1111 on 2/8 and next scheduled dose is for 1000 today.  Has dilaudid PCA with basal rate 0.3mg /h with 0.4mg  q45min.  Wiggles toes.       PLAN:  Catheter removed at 0720, blue tip intact.  No lovenox for 2hrs after catheter removal (nurse notified).  Also spoke with primary service yesterday about d/c'ing basal rate on PCA and adjusting other settings, but preference was to continue the basal rate.  As long as this is the case, please keep pt in a monitored until with continuous pulse ox.  Until pt able to take po, consider adjunctive analgesia such as scheduled IV toradol and tylenol.    Harle Battiest, MD          I saw and examined the patient.  I reviewed the resident's note.  I agree with the findings and plan of care as documented in the resident's note.  Any exceptions/additions are edited/noted.    Jolinda Croak Assyria Morreale, MD 06/19/1094, 04:54 AM

## 2012-07-23 NOTE — Nurses Notes (Signed)
Patient arrival from 8 NE Stepdown.  Report received prior to patient arrival via voicecare with verbal updates.  Patient stable upon arrival.  Full assessment completed and documented.  Will monitor

## 2012-07-23 NOTE — Care Plan (Addendum)
Problem: General Plan of Care(Adult,OB)  Goal: Plan of Care Review(Adult,OB)  The patient and/or their representative will communicate an understanding of their plan of care   Outcome: Ongoing (see interventions/notes)  Plan of care reviewed with the patient and is ongoing. Pain was more controlled after a PRN Dilaudid dose (see MAR) and with utilization of PCA and epidural as well as scheduled medications. the chair and BSC.Patient remained up in the chair for a few hrs during the night. NG remains to low intermittent sx with clear drainage.

## 2012-07-23 NOTE — Nurses Notes (Signed)
Voice care recorded for transfer to room 765

## 2012-07-23 NOTE — Care Plan (Signed)
 Problem: General Plan of Care(Adult,OB)  Goal: Plan of Care Review(Adult,OB)  The patient and/or their representative will communicate an understanding of their plan of care   Outcome: Ongoing (see interventions/notes)  Plan of care reviewed with patient. Patient remains NPO, NG to right nare to LISX, pulling small amounts of clear liquid. Abdomen with midline surgical incision, closed with staples, edges approximated, hypoactive bowel sounds, is belching some. JP to right abdomen draining moderate amounts of serosangious fluid, drain leaking minimal around insertion site, changing dressing prn. TPN continues via PICC line. Patient out of bed to recliner with minimal assistance. Pain assessed every 4 hours and prn, has dilaudid  pca and scheduled tylenol , epidural discontinued this am. Potassium replaced today for K of 3.3, 6 K runs of given. Fall precautions in place, call bell and frequent use items within reach. Remains on telemetry with Q 4 vitals. Patient with floor orders.

## 2012-07-23 NOTE — Progress Notes (Addendum)
[facility]                                                  SURG-ONCOLOGY PROGRESS NOTE    Michele Mason, Michele Mason, 37 y.o. female  Date of Admission:  07/09/2012  Date of Birth:  [birth year]  Date of Service:  07/23/2012    Post Op Day: 4 Days Post-Op S/P Procedure(s) (LRB):  GASTRECTOMY (N/A)  REVISION SCAR ABDOMINAL (N/A)    Subjective: Epidural out, PCA increased. Has been getting OOB. NGT remains in to LIS. Complaints this AM about nursing staff not helping her through the night as she requested. No flatus/BM. No fevers.    Vital Signs:  Temp (24hrs) Max:37.2 C (99 F)      Systolic (24hrs), Avg:146 mmHg, Min:141 mmHg, Max:152 mmHg    Diastolic (24hrs), Avg:87 mmHg, Min:78 mmHg, Max:94 mmHg    Temp  Avg: 36.9 C (98.5 F)  Min: 36.5 C (97.7 F)  Max: 37.2 C (99 F)  Pulse  Avg: 90.7  Min: 78  Max: 106  Resp  Avg: 17  Min: 15  Max: 19  SpO2  Avg: 95.8 %  Min: 89 %  Max: 100 %  MAP (Non-Invasive)  Avg: 109.8 mmHG  Min: 106 mmHG  Max: 114 mmHG  Pain Score (Numeric, Faces): 3    Current Medications:    Current Facility-Administered Medications:  acetaminophen (OFIRMEV) 10 mg/mL IV 650 mg 650 mg Intravenous Q6HRS   adult custom parenteral nutrition  Intravenous Continuous   adult custom parenteral nutrition  Intravenous Continuous   benzocaine 7.5% oral gel  Mucous Membrane 6x/day PRN   diphenhydrAMINE (BENADRYL) 50 mg/mL injection 25 mg Intravenous Q6H PRN   enoxaparin (LOVENOX) 40 mg/0.4 mL SubQ injection 40 mg Subcutaneous Q24H   esomeprazole (NEXIUM) injection 40 mg Intravenous Daily   heparin flush (HEPFLUSH) 10 units/mL injection 2-6 mL Intracatheter Q8HRS   heparin flush (HEPFLUSH) 10 units/mL injection 2 mL Intracatheter Q1 MIN PRN   HYDROmorphone (DILAUDID) 1 mg/mL (tot vol 100 mL) in NS PCA 1 Bag Intravenous Q1H PRN   HYDROmorphone (DILAUDID) 1 mg/mL injection 0.4 mg Intravenous Q4H PRN   LR premix infusion  Intravenous Continuous   NS flush syringe 10-30 mL Intracatheter Q8HRS    NS flush syringe 20-30 mL Intracatheter Q1 MIN PRN   ondansetron (ZOFRAN) 2 mg/mL injection 4 mg Intravenous Q6H PRN   phenol (CHLORASEPTIC) 1.4% oromucosal spray 1 Spray Mouth/Throat Q4H PRN   potassium chloride 10 mEq in SW 50 mL premix infusion 10 mEq Intravenous Q1H   promethazine (PHENERGAN) 25 mg/mL injection 25 mg Intramuscular Q6H PRN       Today's Physical Exam:  General: no distress and appears in significant discomfort  Lungs: Clear to auscultation bilaterally.   Cardiovascular: regular rate and rhythm.  tachycardic to 100s  Abdomen: non-distended, soft, appropriately tender to palpation, dressing removed, incision c/d/i. bowel sounds normal  Extremities: No cyanosis or edema  Neurologic: Alert and oriented x3    I/O:  I/O last 24 hours:      Intake/Output Summary (Last 24 hours) at 07/23/12 1108  Last data filed at 07/23/12 1100   Gross per 24 hour   Intake 3720.4 ml   Output   2680 ml   Net 1040.4 ml     I/O current shift:  02/09 0800 -  02/09 1559  In: 660 [I.V.:660]  Out: 280 [Urine:250; Drains:30]    Prophylaxis:  Date Started Date Completed   DVT/PE  Lovenox and SCDs/ Venodynes     GI Nexium, Zofran         Nutrition/Residuals:  NPO    Labs   Reviewed:   Recent Labs      07/21/12   0047  07/22/12   0032  07/23/12   0114   SODIUM  136  136  138   POTASSIUM  4.0  3.9  3.3*   CHLORIDE  108  107  109   BUN  16  17  15    CREATININE  0.53  0.48*  0.51   ANIONGAP  3*  5  4*   CALCIUM  8.2*  8.6  8.9   WBC  11.6*  7.5  9.6   HGB  8.3*  8.4*  9.2*   HCT  25.3*  25.5*  27.9*   PLTCNT  126*  146  179     Ordered:  None    Diagnostic Tests   Reviewed: N/A  Ordered:  None    Radiology Tests  Reviewed: N/A  Ordered:  None    Assessment/ Plan:   Active Hospital Problems   (*Primary Problem)    Diagnosis   . *Gastric cancer   . Moderate protein-calorie malnutrition   . Chronic pain disorder   . Sarcoidosis     37 yo female s/p distal gastrectomy with RNY Gastrojejunostomy. Tumor T3N0     Epidural out, PCA command dose increased, basal dose discontinued, Continue Tylenol scheduled  NPO diet at this time, renewed TPN- plan for gastrograffin study monday  Continue Zofran 4 mg and Phenergan 25 mg for nausea   Continue daily labs: CBC, BMP  Lovenox 40 daily  Encouraged aggressive IS usage  Activity: Up and OOB as tolerated, ambulate in hall today  Transfer to floor today. D/C telemetry   Send JP drain for TG    Duanne Moron, MD 07/23/2012, 11:08 AM    I saw and examined this patient with the resident above.  Please see the resident's note, which I have carefully reviewed, for full details. I agree with the findings and plan of care as documented in the resident's note.  Any additions/exceptions are edited/noted.    Doing ok.  Pain controlled with PCA, epidural is out.  She is in good spirits.  Has been OOB, ambulating around room.  Has not ambulated well in the hallways yet.  On exam, HD normal, abdomen soft, nondistended, incision intact with staples, drain serous.  Labs notable for a WBC of 9, hypokalemia.  Plan for TPN, UGI tomorrow (I need to see contrast make it past both anastomoses - gastro-jej and jejuno-jej).  Will arrange for a port tomorrow afternoon.  Transfer to the floor, check drain for TGs (rule out chyle from lymphadenectomy).    [name], MD 07/23/2012 1:04 PM  Assistant Professor, Division of Surgical Oncology  [facility] Department of Surgery

## 2012-07-24 ENCOUNTER — Inpatient Hospital Stay (HOSPITAL_COMMUNITY): Admission: RE | Admit: 2012-07-24 | Payer: MEDICAID | Source: Ambulatory Visit

## 2012-07-24 ENCOUNTER — Inpatient Hospital Stay (HOSPITAL_COMMUNITY)
Admission: RE | Admit: 2012-07-24 | Discharge: 2012-07-24 | Disposition: A | Discharge status: Home / Self Care | Payer: MEDICAID | Source: Ambulatory Visit

## 2012-07-24 ENCOUNTER — Encounter (HOSPITAL_COMMUNITY): Payer: Self-pay | Admitting: Certified Registered"

## 2012-07-24 ENCOUNTER — Inpatient Hospital Stay (HOSPITAL_COMMUNITY): Payer: MEDICAID

## 2012-07-24 ENCOUNTER — Encounter (HOSPITAL_COMMUNITY)
Admission: EM | Disposition: A | Discharge status: Home / Self Care | Payer: Self-pay | Source: Other Acute Inpatient Hospital | Attending: SURGICAL ONCOLOGY

## 2012-07-24 ENCOUNTER — Encounter (HOSPITAL_COMMUNITY): Payer: MEDICAID | Admitting: Certified Registered"

## 2012-07-24 LAB — BASIC METABOLIC PANEL, FASTING
ANION GAP: 5 mmol/L (ref 5–16)
BUN/CREAT RATIO: 25 — ABNORMAL HIGH (ref 6–22)
BUN: 13 mg/dL (ref 6–20)
CALCIUM: 8.9 mg/dL (ref 8.5–10.4)
CARBON DIOXIDE: 26 mmol/L (ref 22–32)
CHLORIDE: 104 mmol/L (ref 96–111)
CREATININE: 0.51 mg/dL (ref 0.49–1.10)
ESTIMATED GLOMERULAR FILTRATION RATE: >59 ml/min/1.73m2 (ref >59)
GLUCOSE, FASTING: 121 mg/dL — ABNORMAL HIGH (ref 70–105)
POTASSIUM: 3.8 mmol/L (ref 3.5–5.1)
SODIUM: 135 mmol/L — ABNORMAL LOW (ref 136–145)

## 2012-07-24 LAB — MAGNESIUM: MAGNESIUM: 1.9 mg/dL (ref 1.7–2.5)

## 2012-07-24 LAB — CBC/DIFF
BASOPHILS: 0 %
BASOS ABS: 0.033 10*3/uL (ref 0.0–0.2)
EOS ABS: 0.365 10*3/uL (ref 0.0–0.5)
EOSINOPHIL: 4 %
HCT: 28.4 % — ABNORMAL LOW (ref 33.5–45.2)
HGB: 9.6 g/dL — ABNORMAL LOW (ref 11.2–15.2)
LYMPHOCYTES: 10 %
LYMPHS ABS: 0.956 THOU/uL — ABNORMAL LOW (ref 1.0–4.8)
MCH: 29 pg (ref 27.4–33.0)
MCHC: 33.7 g/dL (ref 32.5–35.8)
MCV: 86.2 fL (ref 78–100)
MONOCYTES: 10 %
MONOS ABS: 0.983 THOU/uL (ref 0.3–1.0)
MPV: 8.9 fL (ref 7.5–11.5)
PLATELET COUNT: 183 10*3/uL (ref 140–450)
PMN ABS: 7.674 THOU/uL (ref 1.5–7.7)
PMN'S: 76 %
RBC: 3.29 MIL/uL — ABNORMAL LOW (ref 3.63–4.92)
RDW: 15.3 % — ABNORMAL HIGH (ref 12.0–15.0)
WBC: 10 10*3/uL (ref 3.5–11.0)

## 2012-07-24 LAB — PERFORM POC WHOLE BLOOD GLUCOSE
GLUCOSE, POINT OF CARE: 105 mg/dL (ref 70–105)
GLUCOSE, POINT OF CARE: 121 mg/dL — ABNORMAL HIGH (ref 70–105)
GLUCOSE, POINT OF CARE: 117 mg/dL — ABNORMAL HIGH (ref 70–105)
GLUCOSE, POINT OF CARE: 145 mg/dL — ABNORMAL HIGH (ref 70–105)

## 2012-07-24 LAB — ALK PHOS (ALKALINE PHOSPHATASE): ALKALINE PHOSPHATASE: 71 U/L (ref 38–126)

## 2012-07-24 LAB — AST (SGOT): AST (SGOT): 29 U/L (ref 8–41)

## 2012-07-24 LAB — ALBUMIN: ALBUMIN: 2.6 g/dL — ABNORMAL LOW (ref 3.5–4.8)

## 2012-07-24 LAB — PREALBUMIN: PREALBUMIN: 20.7 mg/dL (ref 18–40)

## 2012-07-24 LAB — PHOSPHORUS: PHOSPHORUS: 4 mg/dL (ref 2.4–4.7)

## 2012-07-24 LAB — TRIGLYCERIDE: TRIGLYCERIDES: 77 mg/dL (ref <150)

## 2012-07-24 LAB — ALT (SGPT): ALT (SGPT): 16 U/L (ref 7–45)

## 2012-07-24 SURGERY — INSERTION PORT SUBCUTANEOUS
Anesthesia: General | Laterality: Right | Wound class: Clean Wound: Uninfected operative wounds in which no inflammation occurred

## 2012-07-24 MED ORDER — LIDOCAINE 1 %-EPINEPHRINE 1:100,000 INJECTION SOLUTION
15.0000 mL | Freq: Once | INTRAMUSCULAR | Status: DC | PRN
Start: 2012-07-24 — End: 2012-07-24

## 2012-07-24 MED ORDER — PARENTERAL AMINO ACID 10 % COMBINATION NO.6 INTRAVENOUS SOLUTION
INTRAVENOUS | Status: DC
Start: 2012-07-24 — End: 2012-07-24

## 2012-07-24 MED ORDER — LIDOCAINE 1 %-EPINEPHRINE 1:100,000 INJECTION SOLUTION
INTRAMUSCULAR | Status: AC
Start: 2012-07-24 — End: 2012-07-24
  Filled 2012-07-24: qty 40

## 2012-07-24 MED ORDER — DIATRIZOATE MEGLUMINE-DIATRIZOATE SODIUM 66 %-10 % ORAL SOLUTION
ORAL | Status: AC
Start: 2012-07-24 — End: 2012-07-24
  Filled 2012-07-24: qty 360

## 2012-07-24 MED ORDER — SODIUM CHLORIDE 0.9 % INTRAVENOUS SOLUTION
250.0000 mL | Freq: Once | INTRAVENOUS | Status: DC | PRN
Start: 2012-07-24 — End: 2012-07-24
  Administered 2012-07-24: 50 mL
  Filled 2012-07-24: qty 2.5

## 2012-07-24 MED ORDER — BARIUM SULFATE 98 % ORAL POWDER FOR SUSPENSION
INHALATION_SUSPENSION | ORAL | Status: AC
Start: 2012-07-24 — End: 2012-07-24
  Filled 2012-07-24: qty 135

## 2012-07-24 MED ORDER — FENTANYL (PF) 50 MCG/ML INJECTION SOLUTION
Freq: Once | INTRAMUSCULAR | Status: DC | PRN
Start: 2012-07-24 — End: 2012-07-24
  Administered 2012-07-24: 50 ug via INTRAVENOUS
  Administered 2012-07-24: 100 ug via INTRAVENOUS
  Administered 2012-07-24: 50 ug via INTRAVENOUS

## 2012-07-24 MED ORDER — SUCCINYLCHOLINE CHLORIDE 20 MG/ML INJECTION SOLUTION
Freq: Once | INTRAMUSCULAR | Status: DC | PRN
Start: 2012-07-24 — End: 2012-07-24
  Administered 2012-07-24: 140 mg via INTRAVENOUS

## 2012-07-24 MED ORDER — LIDOCAINE (PF) 100 MG/5 ML (2 %) INTRAVENOUS SYRINGE
INJECTION | Freq: Once | INTRAVENOUS | Status: DC | PRN
Start: 2012-07-24 — End: 2012-07-24
  Administered 2012-07-24: 80 mg via INTRAVENOUS

## 2012-07-24 MED ORDER — MIDAZOLAM 1 MG/ML INJECTION SOLUTION
Freq: Once | INTRAMUSCULAR | Status: DC | PRN
Start: 2012-07-24 — End: 2012-07-24
  Administered 2012-07-24 (×2): 1 mg via INTRAVENOUS

## 2012-07-24 MED ORDER — BUPIVACAINE (PF) 0.5 % (5 MG/ML) INJECTION SOLUTION
INTRAMUSCULAR | Status: AC
Start: 2012-07-24 — End: 2012-07-24
  Filled 2012-07-24: qty 30

## 2012-07-24 MED ORDER — HYDROMORPHONE 1 MG/ML INJECTION WRAPPER
0.4000 mg | INJECTION | INTRAMUSCULAR | Status: DC | PRN
Start: 2012-07-24 — End: 2012-07-24
  Filled 2012-07-24: qty 1

## 2012-07-24 MED ORDER — CEFAZOLIN 1 GRAM SOLUTION FOR INJECTION
Freq: Once | INTRAMUSCULAR | Status: DC | PRN
Start: 2012-07-24 — End: 2012-07-24
  Administered 2012-07-24: 2000 mg via INTRAVENOUS

## 2012-07-24 MED ORDER — BARIUM SULFATE 60 % (W/V) ORAL SUSPENSION
ORAL | Status: AC
Start: 2012-07-24 — End: 2012-07-24
  Filled 2012-07-24: qty 355

## 2012-07-24 MED ORDER — SOD BICARB-CITRIC AC-SIMETH 2.21 GRAM-1.53 GRAM/4 GRAM GRANULES EFFERV
GRANULES | ORAL | Status: AC
Start: 2012-07-24 — End: 2012-07-24
  Filled 2012-07-24: qty 1

## 2012-07-24 MED ORDER — MEPERIDINE (PF) 25 MG/ML INJECTION SYRINGE
12.5000 mg | INJECTION | INTRAMUSCULAR | Status: DC | PRN
Start: 2012-07-24 — End: 2012-07-24
  Administered 2012-07-24: 12.5 mg via INTRAVENOUS

## 2012-07-24 MED ORDER — CEFAZOLIN 1 GRAM/50 ML IN DEXTROSE (ISO-OSMOTIC) INTRAVENOUS PIGGYBACK
1.0000 g | INJECTION | Freq: Three times a day (TID) | INTRAVENOUS | Status: AC
Start: 2012-07-24 — End: 2012-07-25
  Administered 2012-07-24 – 2012-07-25 (×3): 1 g via INTRAVENOUS
  Filled 2012-07-24 (×3): qty 50

## 2012-07-24 MED ORDER — LIDOCAINE 1 %-EPINEPHRINE 1:100,000 INJECTION SOLUTION
30.0000 mL | Freq: Once | INTRAMUSCULAR | Status: DC | PRN
Start: 2012-07-24 — End: 2012-07-24
  Administered 2012-07-24: 20 mL via INTRAMUSCULAR

## 2012-07-24 MED ORDER — MEPERIDINE (PF) 25 MG/ML INJECTION SYRINGE
INJECTION | INTRAMUSCULAR | Status: AC
Start: 2012-07-24 — End: 2012-07-24
  Filled 2012-07-24: qty 1

## 2012-07-24 MED ORDER — BUPIVACAINE (PF) 0.5 % (5 MG/ML) INJECTION SOLUTION
20.0000 mL | Freq: Once | INTRAMUSCULAR | Status: DC | PRN
Start: 2012-07-24 — End: 2012-07-24

## 2012-07-24 MED ORDER — ONDANSETRON HCL (PF) 4 MG/2 ML INJECTION SOLUTION
Freq: Once | INTRAMUSCULAR | Status: DC | PRN
Start: 2012-07-24 — End: 2012-07-24
  Administered 2012-07-24: 4 mg via INTRAVENOUS

## 2012-07-24 MED ORDER — CEFAZOLIN 1 GRAM/50 ML IN DEXTROSE (ISO-OSMOTIC) INTRAVENOUS PIGGYBACK
1.0000 g | INJECTION | Freq: Three times a day (TID) | INTRAVENOUS | Status: DC
Start: 2012-07-24 — End: 2012-07-24

## 2012-07-24 MED ORDER — ONDANSETRON HCL (PF) 4 MG/2 ML INJECTION SOLUTION
4.0000 mg | Freq: Once | INTRAMUSCULAR | Status: DC | PRN
Start: 2012-07-24 — End: 2012-07-24

## 2012-07-24 MED ORDER — DEXAMETHASONE SODIUM PHOSPHATE 4 MG/ML INJECTION SOLUTION
Freq: Once | INTRAMUSCULAR | Status: DC | PRN
Start: 2012-07-24 — End: 2012-07-24
  Administered 2012-07-24: 4 mg via INTRAVENOUS

## 2012-07-24 MED ORDER — PROPOFOL 10 MG/ML IV BOLUS
INJECTION | Freq: Once | INTRAVENOUS | Status: DC | PRN
Start: 2012-07-24 — End: 2012-07-24
  Administered 2012-07-24: 30 mg via INTRAVENOUS
  Administered 2012-07-24: 150 mg via INTRAVENOUS

## 2012-07-24 MED ORDER — HEPARIN (PORCINE) 1,000 UNIT/ML INJECTION SOLUTION
3.0000 mL | Freq: Once | INTRAMUSCULAR | Status: DC | PRN
Start: 2012-07-24 — End: 2012-07-24
  Administered 2012-07-24: 3000 [IU]

## 2012-07-24 MED ORDER — HYDROMORPHONE 1 MG/ML INJECTION WRAPPER
0.4000 mg | INJECTION | INTRAMUSCULAR | Status: DC | PRN
Start: 2012-07-24 — End: 2012-07-24
  Administered 2012-07-24: 0.2 mg via INTRAVENOUS
  Administered 2012-07-24 (×2): 0.4 mg via INTRAVENOUS

## 2012-07-24 MED ORDER — LIDOCAINE 1 %-EPINEPHRINE 1:100,000 INJECTION SOLUTION
30.0000 mL | Freq: Once | INTRAMUSCULAR | Status: DC | PRN
Start: 2012-07-24 — End: 2012-07-24

## 2012-07-24 MED ADMIN — sodium chloride 0.9 % (flush) injection syringe: 2 mL | NDC 08290306547

## 2012-07-24 MED ADMIN — ondansetron HCl (PF) 4 mg/2 mL injection solution: 4 mg | INTRAVENOUS | NDC 00641607801

## 2012-07-24 MED ADMIN — heparin lock flush (porcine) 10 unit/mL intravenous solution: 0

## 2012-07-24 MED ADMIN — sodium chloride 0.9 % (flush) injection syringe: 20 mL | NDC 08290306547

## 2012-07-24 MED ADMIN — sodium chloride 0.9 % (flush) injection syringe: 0 mL

## 2012-07-24 MED ADMIN — diatrizoate meglumine and diat.sodium 66 %-10 % oral solution: 40 mL | GASTROSTOMY | NDC 00270044535

## 2012-07-24 MED ADMIN — HYDROmorphone 50 mg/50 mL in 0.9 % sodium chloride injection: 1 | INTRAVENOUS | NDC 61553024296

## 2012-07-24 MED FILL — parenteral amino acid 10 % combination no.6 intravenous solution: INTRAVENOUS | Qty: 950 | Status: AC

## 2012-07-24 SURGICAL SUPPLY — 33 items
22GA Y ST LOW PROF NDLS PD FTP RNT WNG POWERLOC EZ HUBER 1IN (NEEDLES & SYRINGE SUPPLIES) ×1 IMPLANT
ADH LIQUID LF  WTPRF VIAL PREP NONSTAIN MASTISOL STYRAX GUM MASTIC ALC MTHY SLCYT STRL CLR NHZR 2/3 (SEALANTS) ×1 IMPLANT
APPL 70% ISPRP 2% CHG 26ML CHLRPRP HI-LT ORNG PREP STRL LF  DISP CLR (WOUND CARE SUPPLY) ×1 IMPLANT
BLADE 11 BD RB-BCK CBNSTL SURG TISS STRL LF  DISP (SURGICAL CUTTING SUPPLIES) ×1 IMPLANT
CONV USE 23866 - NEEDLE HYPO 27GA 1.5IN STD MONOJECT SS POLYPROP REG BVL LL HUB UL SHRP ANTICORE YW STRL LF  DISP (NEEDLES & SYRINGE SUPPLIES) ×1 IMPLANT
CONV USE ITEM 313634 - COVER PROBE KOVER 96X6IN OR 4_FLG 2 SHEET 24 PRNT STRL LF (DRAPE/PACKS/SHEETS/OR TOWEL) ×1 IMPLANT
DCNTR FLUID 9.2IN SET CLAMP STR TUBE N-PYRG DEHP STRL (IV TUBING & ACCESSORIES) ×1 IMPLANT
DISC USE ITEM 163322 - SYRINGE LL 20ML LTX STRL MED (NEEDLES & SYRINGE SUPPLIES) ×1 IMPLANT
DISCONTINUED USE ITEM 102436 - NEEDLE HYPO 22GA 1.5IN REG WL_BD POLYPROP REG BVL LL HUB (NEEDLES & SYRINGE SUPPLIES) ×1 IMPLANT
DISCONTINUED USE ITEM 97903 - SUTURE 3-0 SH VICRYL 27IN UNDYED BRD COAT ABS (SUTURE/WOUND CLOSURE) ×1 IMPLANT
DONUT EXTREMITY CUSHIONING 31143137 (POSITIONING PRODUCTS) ×1 IMPLANT
DRAPE CARM POLY STRAP MBL XRY 72X42IN LF  EQP (DRAPE/PACKS/SHEETS/OR TOWEL) ×1 IMPLANT
DRAPE REINF FENESTRATE ARMBRD CVR 122X100IN BRST CHST CNVRT (PROTECTIVE PRODUCTS/GARMENTS) ×1 IMPLANT
DRESS TRNSPR 4.75X4IN ADH FILM WTPRF DIAMOND PTRN TGDRM STRL LF (WOUND CARE SUPPLY) ×2 IMPLANT
ELECTRODE ESURG BLADE 6.5IN 3/32IN EDGE STRL .2IN DISP INSL STD SHAFT XTD LF (CAUTERY SUPPLIES) ×1 IMPLANT
ELECTRODE ESURG BLADE PNCL 10FT VLAB STRL SS DISP BUTTON SWH HEX LOCK CORD HLSTR LF  ACPT 3/32IN STD (CAUTERY SUPPLIES) ×1 IMPLANT
KIT NEEDLE GUIDE 18GA 48IN STRL DISP LF  STRT US SYS (NEEDLES & SYRINGE SUPPLIES) IMPLANT
KIT NEEDLE GUIDE 20GA 48IN STRL DISP LF  STRT US SYS (NEEDLES & SYRINGE SUPPLIES) IMPLANT
KIT RM TURNOVER CLEANOP CSTM INFCT CONTROL (KITS & TRAYS (DISPOSABLE)) ×1
KIT RM TURNOVER CLEANOP CUSTOM INFCT CONTROL (KITS & TRAYS (DISPOSABLE)) ×1 IMPLANT
KIT SETUP MINOR SURGICAL (KITS & TRAYS (DISPOSABLE)) ×1 IMPLANT
LABEL MED EZ PEEL MRKR LF (LABELS/CHART SUPPLIES) ×1 IMPLANT
PACK SURG SIRUS MAYO BASIC V TBL STAND CVR 90X50IN 53X24IN LF (CUSTOM TRAYS & PACK) ×1 IMPLANT
PAD ARMBRD BLU (POSITIONING PRODUCTS) ×2 IMPLANT
PORT IMPLANTABLE POWER TI 8FR 1708000 (PORT) ×1 IMPLANT
SPONGE GAUZE STRL 2 X 2IN 1806 30BX/CS 50PK/BX 1500PK/CS (WOUND CARE SUPPLY) ×2 IMPLANT
STRIP 4X.5IN STRSTRP PLSTR REINF SKNCLS WHT STRL LF (WOUND CARE SUPPLY) ×1 IMPLANT
SUTURE 2-0 SH PROLENE 36IN BLU 2 ARM MONOF NONAB (SUTURE/WOUND CLOSURE) IMPLANT
SUTURE 3-0 SH VICRYL 27IN UNDYED BRD COAT ABS (SUTURE/WOUND CLOSURE) ×1
SUTURE 4-0 PS2 MONOCRYL MTPS 27IN UNDYED MONOF ABS (SUTURE/WOUND CLOSURE) ×1 IMPLANT
SYRINGE 5ML LF  STRL ST GRAD MED POLYPROP DISP (NEEDLES & SYRINGE SUPPLIES) ×1 IMPLANT
SYRINGE LL 10ML LF  STRL CONTROL CONCEN TIP PRGN FREE DEHP-FR MED DISP (NEEDLES & SYRINGE SUPPLIES) ×1 IMPLANT
SYRINGE LL 3ML LF  STRL GRAD N-PYRG DEHP-FR PVC FREE MED DISP CLR (NEEDLES & SYRINGE SUPPLIES) ×1 IMPLANT

## 2012-07-24 NOTE — Anesthesia Postprocedure Evaluation (Signed)
ANESTHESIA POSTOP EVALUATION NOTE        Anesthesia Service      [facility]     07/24/2012     Last Vitals: Temperature: 36.9 C (98.4 F) (07/24/12 1456)  Heart Rate: 110 (07/24/12 1456)  BP (Non-Invasive): 175/110 mmHg (07/24/12 1456)  Respiratory Rate: 16 (07/24/12 1456)  SpO2-1: 98 % (07/24/12 1456)  Pain Score (Numeric, Faces): 9 (07/24/12 1457)    Procedure(s):  INSERTION PORT SUBCUTANEOUS    Patient is sufficiently recovered from the effects of anesthesia to participate in the evaluation and has returned to their pre-procedure level.  I have reviewed and evaluated the following:  Respiratory Function: Consistent with pre anesthetic level  Cardiovascular Function: Consistent with pre anesthetic level  Mental Status: Return to pre anesthetic baseline level  Pain: Sufficiently controlled with medication  Nausea and Vomiting: Absent or sufficiently controlled with medication  Post-op Anesthetic Complications: None    Comment/ re-evaluation for any variations: None

## 2012-07-24 NOTE — CDI REVIEW (Signed)
Admissions, Findings, and Consultations:  2/8 Surg Onc: chronic pain d/o, sarcoidosis, s/p distal gastrectomy with RNY Gastrojejunostomy. Tumor T3N0, TPN  2/9 Surg Onc: HypoK  2/10 Surg Onc; same   Diagnostics and Results:   Procedures and Treatments:   Meds, IV's, Rx Blood:   Comments:

## 2012-07-24 NOTE — Anesthesia Preprocedure Evaluation (Signed)
 Airway       Mallampati: I    TM distance: >3 FB    Neck ROM: full  Mouth Opening: good.  No Facial hair    No endotracheal tube present  No Tracheostomy present    Dental       Dentition intact             Pulmonary    Breath sounds clear to auscultation  (-) no rhonchi, no decreased breath sounds, no wheezes, no rales and no stridor     Cardiovascular    Rhythm: regular  Rate: Normal  (-) no friction rub, carotid bruit is not present, no peripheral edema and no murmur     Other findings  Old midline abdominal scar               Planned anesthesia type: general  ASA 3        Patient's NPO status is appropriate for Anesthesia.      Anesthetic plan and risks discussed with patient.              Plan discussed with resident and CRNA.

## 2012-07-24 NOTE — Care Plan (Signed)
Problem: General Plan of Care(Adult,OB)  Goal: Plan of Care Review(Adult,OB)  The patient and/or their representative will communicate an understanding of their plan of care   Outcome: Ongoing (see interventions/notes)  1. Continue current TPN at this time.  2. Continue to monitor electrolytes, blood sugars, LFTs and TG  3. Monitor daily weights.   Will follow.

## 2012-07-24 NOTE — Progress Notes (Addendum)
Called to patients bedside. Nurse states patient is refusing port placement and demanding NG tube be removed. I went in and spoke with patient. She states her family is not here, she wants the NG tube out, and she just "needs some peace." States she has been through a lot and she understands she needs the port placed but wants it done at a later time. States she "knows she will not start chemo yet," so she does not need it today. I explained why NG tube was in place patient but patient states she wants it out. Patient was suppose to be in pre-op now, however, refuses to go. I spoke with patient and called Dr. Mayford Knife conveying her wishes.    Chauncey Fischer, PA-C 07/24/2012, 12:33 PM    I was not present for this patient encounter.  I have reviewed the chief complaint, history, physical examination, laboratory values, imaging reports, and have discussed the plan of care for this patient as outlined by the mid-level provider above.  Please see the mid-level's note, which I have carefully reviewed, for full details.  Any additions/exceptions are edited/noted.    I did stop by to disuss these issues following the interaction above.  Pt was anxious.  Once I assured her that I would personally be performing the procedure, she understood and agreed to proceed.    [name], MD 07/24/2012 1:24 PM  Assistant Professor, Division of Surgical Oncology  [facility] Department of Surgery

## 2012-07-24 NOTE — Care Plan (Signed)
Problem: General Plan of Care(Adult,OB)  Goal: Plan of Care Review(Adult,OB)  The patient and/or their representative will communicate an understanding of their plan of care   Outcome: Ongoing (see interventions/notes)  Plan of care to control pain. Assessment per flowsheet. Pain is mostly controlled with PCA pump. Patient ambulates to restroom with 1 assist. NGT intact and set to LIS. Patient resting quietly at this time.

## 2012-07-24 NOTE — Ancillary Notes (Signed)
 Medical Nutrition Therapy Follow Up       SUBJECTIVE : patient busy when visited, remains NPO      OBJECTIVE:  Current Diet Order/Nutrition Support:  NPO; TPN provides 1730 cal (34 cal/kg), 95 g prot (2 g/kg), 1220 dextrose  cal (GIR 4.8), 130 IL (8% of total cal)  at 75 ml/hr    Height:  154 cm     Weight trends: (1/28- standing weight) 59.4 kg , 1/31- 62.7 kg, 2/2- 62.8 kg , 2/8- 71.8 kg  IBW: 47.7 kg %IBW: 125 % Adj BW: 51 kg  UBW: 66.3 kg (9 weeks ago) %UBW: 89 % Weight change: has decreased 7 kg or 10.5 % in 9 weeks     Physical Assessment:  GI: last BM 2/4  Tubes: nasogastric with 200 ml out yesterday; blake drain in abdomen  Skin:surgical incision abdomen    Labs:   BMP:     Recent Labs      07/24/12   0054   SODIUM  135*   POTASSIUM  3.8   CHLORIDE  104   CO2  26   BUN  13   CREATININE  0.51   GLUCOSEFAST  121*   ANIONGAP  5   BUNCRRATIO  25*   GFR  >59   CALCIUM   8.9   Results for Peri, Humna (MRN [id number]) as of 07/24/2012 09:06   Ref. Range 07/24/2012 00:54   AST (SGOT) Latest Range: 8-41 U/L 29     Results for Exley, Deriana (MRN [id number]) as of 07/24/2012 09:06   Ref. Range 07/24/2012 00:54   ALT (SGPT) Latest Range: 7-45 U/L 16   Results for Grabski, Tiffiny (MRN [id number]) as of 07/24/2012 09:06   Ref. Range 07/24/2012 00:54   ALKALINE PHOSPHATASE Latest Range: 38-126 U/L 71     Results for Apodaca, Meegan (MRN [id number]) as of 07/24/2012 09:06   Ref. Range 07/24/2012 00:54   TRIGLYCERIDES Latest Range: <150 mg/dL 77     Results for Sluder, Briane (MRN [id number]) as of 07/24/2012 09:06   Ref. Range 07/23/2012 07:04 07/23/2012 11:25 07/23/2012 21:37   GLUCOSE, POINT OF CARE Latest Range: 70-105 mg/dL 884 (H) 885 (H) 894       MNT Labs:     Albumin /Prealbumin/CRP:     ALBUMIN    Date Value Range Status   07/17/2012 2.7* 3.5 - 4.8 g/dL Final   8/73/7985 2.9* 3.5 - 4.8 g/dL Final        PREALBUMIN   Date Value Range Status   07/17/2012 16.7* 18 - 40 mg/dL Final   8/72/7985 88.6* 18 - 40 mg/dL Final     H&H:    HGB   Date  Value Range Status   07/24/2012 9.6* 11.2 - 15.2 g/dL Final        HCT   Date Value Range Status   07/24/2012 28.4* 33.5 - 45.2 % Final       Meds:     Current Facility-Administered Medications:  acetaminophen  (OFIRMEV ) 10 mg/mL IV 650 mg 650 mg Intravenous Q6HRS   adult custom parenteral nutrition  Intravenous Continuous   benzocaine  7.5% oral gel  Mucous Membrane 6x/day PRN   diphenhydrAMINE  (BENADRYL ) 50 mg/mL injection 25 mg Intravenous Q6H PRN   enoxaparin  (LOVENOX ) 40 mg/0.4 mL SubQ injection 40 mg Subcutaneous Q24H   esomeprazole  (NEXIUM ) injection 40 mg Intravenous Daily   heparin  flush (HEPFLUSH) 10 units/mL injection 2-6 mL Intracatheter Q8HRS   heparin  flush (HEPFLUSH) 10 units/mL injection 2 mL Intracatheter Q1  MIN PRN   HYDROmorphone  (DILAUDID ) 1 mg/mL (tot vol 100 mL) in NS PCA 1 Bag Intravenous Q1H PRN   HYDROmorphone  (DILAUDID ) 1 mg/mL injection 0.4 mg Intravenous Q4H PRN   LR premix infusion  Intravenous Continuous   NS flush syringe 10-30 mL Intracatheter Q8HRS   NS flush syringe 20-30 mL Intracatheter Q1 MIN PRN   ondansetron  (ZOFRAN ) 2 mg/mL injection 4 mg Intravenous Q6H PRN   phenol (CHLORASEPTIC) 1.4% oromucosal spray 1 Spray Mouth/Throat Q4H PRN   promethazine  (PHENERGAN ) 25 mg/mL injection 25 mg Intramuscular Q6H PRN       ASSESSMENT:    Re-assessed needs if applicable:    Calories: 30-33 Cals/59.4 kg = 1550-1700 calories/day  Protein: 1.5-2 g/59.4kg = 89-119 grams protein/day  Fluid: 30-33 mLs/59.4kg = 1550-1700  mLs/day      PLAN / INTERVENTION        Goals:  Tolerate diet/ nutrition support and Meet estimated needs for nutrients/fluids    Monitor / Evaluate: Monitor: I/O's, Weight Status and Pertinent Labs    Tolerance of : nutrition support    Recommend :   1. Continue current TPN at this time.  2. Continue to monitor electrolytes, blood sugars, LFTs and TG  3. Monitor daily weights.   Will follow.     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     Jacquelynne Sierras, RD, CNSC, LD  07/24/2012, 9:03 AM

## 2012-07-24 NOTE — OR Surgeon (Signed)
[facility]  BRIEF OPERATIVE NOTE    Patient Name: [facility] Number: [id number]  Date of Service: 07/24/2012  Date of Birth: 10/20/1975    Pre-Operative Diagnosis:   1. Gastric cancer  2. Need for long-term IV access   Post-Operative Diagnosis:   1. Gastric cancer  2. Need for long-term IV access  Procedure(s)/Description:    1. Intraoperative ultrasound interpretation  2. Intraoperative fluoroscopic interpretation  3. Insertion of right IJ port    Findings/Complexity (inherent to the procedure performed): Vein accessed on second attempt.  Wire threaded easily.  Port drew blood and flushed easily.  It was locked with strong heparin.  On final fluoro pictures, the tip of the port at atriocaval junction and port tubing without kinks.   Complications (unintended/unexpected/iatrogenic/accidental/inadvertent events): Carotid artery stick on first attempt.  Wound Class: Clean Wound: Uninfected operative wounds in which no inflammation occurred    Attending Surgeon: Leota Sauers  Assistant(s): Venia Minks, [place]den[place] PGY2    Anesthesia Type: General endotracheal anesthesia  Antibiotics: Ancef  Estimated Blood Loss: Minimal  Fluids Given: 500 mL  Blood Given: 0 units  Urine Output: Not recorded (no foley)    Tubes: None  Drains: None  Specimens/ Cultures: None.  Implants: Port.           Disposition: PACU - hemodynamically stable.  Condition: stable

## 2012-07-24 NOTE — Care Plan (Signed)
Problem: General Plan of Care(Adult,OB)  Goal: Plan of Care Review(Adult,OB)  The patient and/or their representative will communicate an understanding of their plan of care   Outcome: Ongoing (see interventions/notes)  For nurse signing, patient states that her pain is controlled with PCA pump.  Patient acknowledges risk of falling with tubing, calls out prior to getting oob, ambulating well.  Pollyann Glen, LPN 1/61/0960, 4:54 PM

## 2012-07-24 NOTE — Nurses Notes (Signed)
Pt medicated with 4mg  zofran IVP for nausea, no emesis noted. NGT  Clamped for transport to fluro.

## 2012-07-24 NOTE — Nurses Notes (Signed)
 Nausea resolved.

## 2012-07-24 NOTE — Nurses Notes (Addendum)
Patient back from fluro and reconnected to LIS via NG.  Denies pain.  Pollyann Glen, LPN 09/20/8117, 14:78 AM  Ambulated x 1 with nurse in hallway this am.  Tolerated fair.  Pollyann Glen, LPN 2/95/6213, 0:86 PM

## 2012-07-24 NOTE — Nurses Notes (Signed)
Elevated blood pressures brought to service's attention via text page. Dr. Georgianne Fick stated "Primary team will restart home BP meds".

## 2012-07-24 NOTE — OR Surgeon (Signed)
 [place]  [facility]                                  DEPARTMENT OF SURGERY                                     OPERATION SUMMARY    PATIENT NAME: Michele Mason, Michele Mason  HOSPITAL [id number]  DATE OF SERVICE:07/24/2012  DATE OF BIRTH: 1976-03-17    PREOPERATIVE DIAGNOSES:  1.  Gastric cancer.  2.  Need for long-term intravenous access for chemotherapy.    POSTOPERATIVE DIAGNOSES:  1.  Gastric cancer.  2.  Need for long-term intravenous access for chemotherapy.    NAME OF PROCEDURES:  1.  Intraoperative ultrasound interpretation.  2.  Intraoperative fluoroscopic interpretation.  3.  Insertion of right internal jugular port.    SURGEONS:  Dale Artist Gearing, MD (staff), Selinda Bihari, MD (PGY-5) and Lamar BROCKS. Lindia, MD (PGY-2).    ESTIMATED BLOOD LOSS:  Minimal.    INTRAVENOUS FLUIDS:  500 mL.    URINE OUTPUT:  Not recorded (no Foley).    INDICATIONS FOR PROCEDURE:  [name] is a 37 year old female who presented with abdominal pain and vomiting and was found to have a mass in the antrum of her stomach, the biopsies of which proved adenocarcinoma.  She was transferred from an outside hospital for gastrectomy which she underwent without complications on July 19, 2012.  Given a large tumor, she has indications to undergo chemotherapy and presents today for port placement.  The risks and benefits of the procedure including pneumothorax, arterial puncture, bleeding and infection were discussed in detail, along with anesthetic complications such as acute MI, stroke and death.  The patient verbalized understanding, and after deliberation, agreed to proceed as recommended and signed informed consent.    OPERATIVE FINDINGS:  The right IJ was visualized with ultrasound and accessed on the second attempt.  On the first attempt, there was an inadvertent carotid artery puncture.  Upon access of the internal jugular vein, the wire threaded easily.  The port drew blood and flushed  easily after it had been placed.  It was locked with a strong heparin -containing solution.  On final fluoroscopy pictures, the tip of the port was the atriocaval junction, and the port tubing was without kinks.    DESCRIPTION OF PROCEDURE:  Once the procedure had been explained and informed consent was obtained, the patient was transferred to the operating room and placed supine on the operating room table.  After completion of the proper patient verification and timeout protocols, the patient was placed under general endotracheal anesthesia by the anesthetic team.  The right neck and chest wall were then shaved, prepped and draped in the standard surgical fashion.    The right internal jugular vein was easily identified utilizing ultrasound.  The skin of the right neck and chest at the future location of the port was anesthetized utilizing a combination of 0.5% Marcaine  and 1% lidocaine  with epinephrine .  A needle was then placed into the internal jugular vein under direct ultrasound guidance.  The vein was accessed on the second attempt.  During the first attempt, an inadvertent carotid artery puncture was noted.  Direct pressure was held for 5 minutes prior to proceeding.  Once the internal jugular vein had been accessed, the wire was easily threaded through the needle  and verified to be the superior vena cava by fluoroscopy.  At this point, the wire was clamped to the drapes to ensure that it would not be inadvertently removed from the patient and our concentration turned towards creating a pocket for the port.  An incision was made on the anterior chest wall to the right of the sternum and carried down through the subcutaneous tissue utilizing electrocautery.  Utilizing both blunt and sharp dissection, a pocket for the port was created inferior to the incision; bleeding was not encountered.  The dilator and sheath were then placed over top of the wire into the right internal jugular vein under direct  fluoroscopic guidance.  The catheter of the port was then placed through the sheath with the tip at the atriocaval junction, again confirmed utilizing fluoroscopic guidance.  Utilizing a tunneling device, a tunnel was then created between the pocket and the chest and the opening and the wire at the base of the right neck which was enlarged with a knife.  The port tubing was then placed with our tunneler and brought through into the pocket on the chest wall.  The port was attached to the tubing after correct measuring and was sutured to the chest wall in 2 locations utilizing 2-0 Prolene stitches to prevent the port from inadvertent flipping in the future.  The Langford needle was then used to access the port.  It drew blood and was easily flushed with a weak heparin -containing solution.  The port was then locked with strong heparin -containing solution.  Completion radiographs were taken and saved utilizing fluoroscopy, demonstrating the port in excellent position with no kinks in the port tubing; the tip was at the atriocaval junction.  The skin incision on the right neck was closed utilizing a 4-0 Monocryl stitch in interrupted subcuticular fashion.  The incision on the chest was closed in 2 layers utilizing interrupted 3-0 Vicryl stitches for the deep dermis followed by a 4-0 Monocryl running subcuticular stitch for the skin.  The skin of the right neck and chest was then washed and dried, and Steri-Strips were applied followed by Dermabond as a protective layer.    The patient was then awakened from anesthesia, having tolerated the procedure well with no immediate complications and was transferred to the recovery room in stable condition.  A chest x-ray will be obtained in the PACU.  The instrument, sponge and equipment counts were correct x2.  I was present throughout the entirety of this case.    Dale Artist Gearing, MD  Assistant Professor  [facility] Department of Surgery    [id number]; D: 07/24/2012 17:33:57; T:  07/24/2012 22:14:36

## 2012-07-24 NOTE — Anesthesia Transfer of Care (Signed)
ANESTHESIA TRANSFER OF CARE NOTE        Anesthesia Service      [facility]         Last Vitals: Temperature: 36.9 C (98.4 F) (07/24/12 1456)  Heart Rate: 110 (07/24/12 1456)  BP (Non-Invasive): 175/110 mmHg (07/24/12 1456)  Respiratory Rate: 16 (07/24/12 1456)  SpO2-1: 98 % (07/24/12 1456)  Pain Score (Numeric, Faces): 9 (07/24/12 1457)    Patient transferred to PACU with O2 per NC in stable condition. Report given to RN.  Temp = 36.7  HR 101  BP 139/92  RR 16  SaO2 100  2/10/2014at 4:49 PM.

## 2012-07-25 LAB — URINALYSIS WITH MICROSCOPIC REFLEX IF INDICATED
BILIRUBIN: NEGATIVE
BLOOD: NEGATIVE
KETONES: NEGATIVE mg/dL
NITRITE: NEGATIVE
PH URINE: 6.5 (ref 5.0–8.0)
PROTEIN: NEGATIVE mg/dL
SPECIFIC GRAVITY, URINE: 1.012 (ref 1.005–1.030)
UROBILINOGEN: NORMAL mg/dL

## 2012-07-25 LAB — BASIC METABOLIC PANEL
ANION GAP: 6 mmol/L (ref 5–16)
BUN/CREAT RATIO: 31 — ABNORMAL HIGH (ref 6–22)
BUN: 16 mg/dL (ref 6–20)
CALCIUM: 8.8 mg/dL (ref 8.5–10.4)
CARBON DIOXIDE: 25 mmol/L (ref 22–32)
CHLORIDE: 105 mmol/L (ref 96–111)
CREATININE: 0.52 mg/dL (ref 0.49–1.10)
ESTIMATED GLOMERULAR FILTRATION RATE: >59 mL/min/{1.73_m2} (ref >59)
GLUCOSE,NONFAST: 130 mg/dL (ref 65–139)
POTASSIUM: 4 mmol/L (ref 3.5–5.1)
SODIUM: 136 mmol/L (ref 136–145)

## 2012-07-25 LAB — CBC/DIFF
BASOPHILS: 0 %
BASOS ABS: 0.037 10*3/uL (ref 0.0–0.2)
EOS ABS: 0.012 THOU/uL (ref 0.0–0.5)
EOSINOPHIL: 0 %
HCT: 29.8 % — ABNORMAL LOW (ref 33.5–45.2)
HGB: 9.2 g/dL — ABNORMAL LOW (ref 11.2–15.2)
LYMPHOCYTES: 5 %
LYMPHS ABS: 0.581 10*3/uL — ABNORMAL LOW (ref 1.0–4.8)
MCH: 29.1 pg (ref 27.4–33.0)
MCHC: 30.7 g/dL — ABNORMAL LOW (ref 32.5–35.8)
MCV: 94.9 fL (ref 78–100)
MONOCYTES: 4 %
MONOS ABS: 0.426 THOU/uL (ref 0.3–1.0)
MPV: 9.9 fL (ref 7.5–11.5)
PLATELET COUNT: 193 THOU/uL (ref 140–450)
PMN ABS: 10.234 10*3/uL — ABNORMAL HIGH (ref 1.5–7.7)
PMN'S: 91 %
RBC: 3.14 MIL/uL — ABNORMAL LOW (ref 3.63–4.92)
RDW: 15.9 % — ABNORMAL HIGH (ref 12.0–15.0)
WBC: 11.3 10*3/uL — ABNORMAL HIGH (ref 3.5–11.0)

## 2012-07-25 LAB — URINALYSIS, MICROSCOPIC
RBC'S: <1 /HPF (ref 0–4)
WBC'S: <1 /HPF (ref 0–6)

## 2012-07-25 LAB — PERFORM POC WHOLE BLOOD GLUCOSE: GLUCOSE, POINT OF CARE: 78 mg/dL (ref 70–105)

## 2012-07-25 MED ORDER — FAT EMULSION 30 % INTRAVENOUS
INTRAVENOUS | Status: DC
Start: 2012-07-25 — End: 2012-07-25

## 2012-07-25 MED ORDER — MAGNESIUM SULFATE 4 MEQ/ML (50 %) INJECTION SOLUTION
INTRAMUSCULAR | Status: DC
Start: 2012-07-25 — End: 2012-07-25

## 2012-07-25 MED ORDER — CYCLOBENZAPRINE 10 MG TABLET
10.0000 mg | ORAL_TABLET | Freq: Three times a day (TID) | ORAL | Status: DC | PRN
Start: 2012-07-25 — End: 2012-07-27
  Administered 2012-07-25 – 2012-07-27 (×5): 10 mg via ORAL
  Filled 2012-07-25 (×8): qty 1

## 2012-07-25 MED ORDER — ESOMEPRAZOLE MAGNESIUM 40 MG CAPSULE,DELAYED RELEASE
40.0000 mg | DELAYED_RELEASE_CAPSULE | Freq: Every morning | ORAL | Status: DC
Start: 2012-07-25 — End: 2012-08-01
  Administered 2012-07-25 – 2012-07-28 (×4): 40 mg via ORAL
  Administered 2012-07-29: 0 mg via ORAL
  Administered 2012-07-30 – 2012-08-01 (×3): 40 mg via ORAL
  Filled 2012-07-25 (×8): qty 1

## 2012-07-25 MED ORDER — SENNOSIDES 8.6 MG-DOCUSATE SODIUM 50 MG TABLET
1.00 | ORAL_TABLET | Freq: Two times a day (BID) | ORAL | Status: DC
Start: 2012-07-25 — End: 2012-08-01
  Administered 2012-07-25 – 2012-07-27 (×5): 1 via ORAL
  Administered 2012-07-27: 0 via ORAL
  Administered 2012-07-27 – 2012-07-28 (×2): 1 via ORAL
  Administered 2012-07-28 – 2012-07-29 (×3): 0 via ORAL
  Administered 2012-07-30: 1 via ORAL
  Administered 2012-07-30: 0 via ORAL
  Administered 2012-07-31 – 2012-08-01 (×3): 1 via ORAL
  Filled 2012-07-25 (×16): qty 1

## 2012-07-25 MED ORDER — MAGNESIUM HYDROXIDE 400 MG/5 ML ORAL SUSPENSION
30.00 mL | Freq: Every evening | ORAL | Status: DC
Start: 2012-07-25 — End: 2012-08-01
  Administered 2012-07-25 – 2012-07-27 (×3): 2400 mg via ORAL
  Administered 2012-07-28 – 2012-07-30 (×3): 0 mg via ORAL
  Administered 2012-07-31: 2400 mg via ORAL

## 2012-07-25 MED ORDER — OXYCODONE 20 MG/ML ORAL CONCENTRATE
10.00 mg | ORAL | Status: DC | PRN
Start: 2012-07-25 — End: 2012-07-27
  Administered 2012-07-25 – 2012-07-27 (×8): 10 mg via ORAL
  Filled 2012-07-25 (×9): qty 0.5

## 2012-07-25 MED ORDER — ATENOLOL 25 MG TABLET
12.5000 mg | ORAL_TABLET | Freq: Every day | ORAL | Status: DC
Start: 2012-07-25 — End: 2012-08-01
  Administered 2012-07-25 – 2012-07-26 (×2): 12.5 mg via ORAL
  Administered 2012-07-27: 0 mg via ORAL
  Administered 2012-07-27: 12.5 mg via ORAL
  Administered 2012-07-28 – 2012-08-01 (×5): 0 mg via ORAL
  Filled 2012-07-25 (×8): qty 0.5

## 2012-07-25 MED ADMIN — ondansetron HCl (PF) 4 mg/2 mL injection solution: 4 mg | INTRAVENOUS | NDC 00641607801

## 2012-07-25 MED ADMIN — sodium chloride 0.9 % (flush) injection syringe: 0 mL

## 2012-07-25 MED ADMIN — heparin lock flush (porcine) 10 unit/mL intravenous solution: 0 mL

## 2012-07-25 MED ADMIN — sodium chloride 0.9 % (flush) injection syringe: 10 mL | NDC 08290306547

## 2012-07-25 MED ADMIN — heparin lock flush (porcine) 10 unit/mL intravenous solution: 0

## 2012-07-25 MED ADMIN — HYDROmorphone 50 mg/50 mL in 0.9 % sodium chloride injection: 1 | INTRAVENOUS | NDC 61553024296

## 2012-07-25 MED FILL — parenteral amino acid 10 % combination no.6 intravenous solution: INTRAVENOUS | Qty: 480 | Status: AC

## 2012-07-25 NOTE — Nurses Notes (Signed)
Patient ambulated with CA, tolerated fair, request pain medication.  Given per [facility].  Pollyann Glen, LPN 1/91/4782, 9:56 PM

## 2012-07-25 NOTE — Ancillary Notes (Signed)
[facility]  Spiritual Care Note    Patient Name:  Michele Mason  Date of Encounter:   07/25/2012     07/25/12 1500   Clinical Encounter Type   Reason for Visit Patient/Person/Family Request   Referral From Nurse Marjie Skiff Spiritual Care Visit At This Time No   Visited With Patient   Patient Spiritual Encounters   Spiritual Assessment Using Prayer and Faith for Support;Hopeful;Anxiety/Fear   Interventions Provided supportive presence;Prayed;Offered empathy;Facilitated story telling;Explored emotions;Explored/supported faith and beliefs   Outcomes Patient shared/processed his/her own story;Patient processed emotions;Patient connected to spiritual support;Patient states, " I feel better"   Values/Beliefs/Spiritual Care   (F) Faith: Importance of Culture, Spirituality, Religion In Life Faith is very important   Cultural, Spiritual, Religious Practices Important For Staff to Know Belief in god, prayer, participates in a local community of faith   (I) Influence: Cultural, Spiritual, Religious Impact On Thoughts About Health Belief that god is present with her regardless of the situation   (C) Community: Cultural, Spiritual, Religious Community Involvement Participates in a local community of faith in [place], [place]   What Gives Your Life Meaning? Faith, good health   Pastoral Care/Clergy/Spiritual Advisor Notified yes   Coping/Psychosocial Response Assessments   Observed Emotional State pleasant;frustrated   Verbalized Emotional State frustration;hopefulness   Trust Relationship/Rapport thoughts/feelings/responses acknowledged;emotional support provided;empathic listening provided;reassurance provided       Other Pertinent Information:  Received referral to visit with Patient from Nurse Jasmine December this afternoon.     Visited with the Patient who shared some of her story. She stated that her faith is very, very important to her, and she values prayer.  She shared that she did have some frustration today resultant of an encounter with one of the hospital staff. She shared that she knows she needs to "move past" this so that she can focus on getting better.      Offered supportive presence to Ms. Hyacinth Meeker as she shared her story.  Shared with her that if she felt she was treated wrongly that she has the right to meet with a Patient Advocate. She stated that she did not want to do that at this time.  Per the Patient's request, offered prayer for grace, strength, peace, and healing.  She shared that she appreciates the care that she has received from the Spiritual Care Department.      Barb Merino  Pager: [phone]  Total Time of Encounter: 15 min.

## 2012-07-25 NOTE — Progress Notes (Addendum)
[facility]                                                  SURG-ONCOLOGY PROGRESS NOTE    Michele Mason, Michele Mason, 37 y.o. female  Date of Admission:  07/09/2012  Date of Birth:  1975/10/05  Date of Service:  07/25/2012    Post Op Day: 1 Day Post-Op S/P Procedure(s) (LRB):  INSERTION PORT SUBCUTANEOUS (Right)    Subjective: Has been getting OOB. States she is doing better this morning.  Agreeable to decreasing some of her pain meds.  Continues to be concerned about her blood pressure    Vital Signs:  Temp (24hrs) Max:37.2 C (98.9 F)      Systolic (24hrs), Avg:142 mmHg, Min:114 mmHg, Max:175 mmHg    Diastolic (24hrs), Avg:86 mmHg, Min:59 mmHg, Max:110 mmHg    Temp  Avg: 36.9 C (98.4 F)  Min: 36.8 C (98.2 F)  Max: 37.2 C (98.9 F)  Pulse  Avg: 89.9  Min: 74  Max: 116  Resp  Avg: 18.4  Min: 14  Max: 24  SpO2  Avg: 99.5 %  Min: 98 %  Max: 100 %  MAP (Non-Invasive)  Avg: 95.3 mmHG  Min: 74 mmHG  Max: 123 mmHG  Pain Score (Numeric, Faces): 4    Current Medications:    Current Facility-Administered Medications:  adult custom parenteral nutrition  Intravenous Continuous   atenolol (TENORMIN) tablet 12.5 mg Oral Daily   ceFAZolin (ANCEF) 1 g in iso-osmotic 50 mL premix IVPB 1 g Intravenous Q8H   cyclobenzaprine (FLEXERIL) tablet 10 mg Oral 3x/day PRN   diphenhydrAMINE (BENADRYL) 50 mg/mL injection 25 mg Intravenous Q6H PRN   enoxaparin (LOVENOX) 40 mg/0.4 mL SubQ injection 40 mg Subcutaneous Q24H   esomeprazole (NEXIUM) capsule 40 mg Oral Daily before Breakfast   heparin flush (HEPFLUSH) 10 units/mL injection 2-6 mL Intracatheter Q8HRS   heparin flush (HEPFLUSH) 10 units/mL injection 2 mL Intracatheter Q1 MIN PRN   HYDROmorphone (DILAUDID) 1 mg/mL (tot vol 100 mL) in NS PCA 1 Bag Intravenous Q1H PRN   HYDROmorphone (DILAUDID) 1 mg/mL injection 0.4 mg Intravenous Q4H PRN   LR premix infusion  Intravenous Continuous   NS flush syringe 10-30 mL Intracatheter Q8HRS    NS flush syringe 20-30 mL Intracatheter Q1 MIN PRN   NS flush syringe 2 mL Intracatheter Q8HRS   And      NS flush syringe 2-6 mL Intracatheter Q1 MIN PRN   ondansetron (ZOFRAN) 2 mg/mL injection 4 mg Intravenous Q6H PRN   oxyCODONE concentrate (ROXICODONE INTENSOL) 20mg  per mL oral liquid 10 mg Oral Q4H PRN       Today's Physical Exam:  General: no distress and appears in significant discomfort  Lungs: Clear to auscultation bilaterally.   Cardiovascular: regular rate and rhythm.    Chest: right sided port incision intact  Abdomen: non-distended, soft, appropriately tender to palpation, dressing removed, incision c/d/i. bowel sounds normal.  JP in place  Extremities: No cyanosis or edema  Neurologic: Alert and oriented x3    I/O:  I/O last 24 hours:      Intake/Output Summary (Last 24 hours) at 07/25/12 0821  Last data filed at 07/25/12 0749   Gross per 24 hour   Intake 3681.6 ml   Output   2560 ml   Net 1121.6 ml  I/O current shift:       Prophylaxis:  Date Started Date Completed   DVT/PE  Lovenox and SCDs/ Venodynes     GI Nexium, Zofran         Nutrition/Residuals:  Clear Liquid    Labs   Reviewed:   Recent Labs      07/23/12   0114  07/24/12   0054  07/25/12   0026  07/25/12   0031  07/25/12   0122   SODIUM  138  135*   --    --   136   POTASSIUM  3.3*  3.8   --    --   4.0   CHLORIDE  109  104   --    --   105   BUN  15  13   --    --   16   CREATININE  0.51  0.51   --    --   0.52   GLUCOSE   --    --    --   NEGATIVE   --    ANIONGAP  4*  5   --    --   6   CALCIUM  8.9  8.9   --    --   8.8   MAGNESIUM   --   1.9   --    --    --    PHOSPHORUS   --   4.0   --    --    --    WBC  9.6  10.0  11.3*   --    --    HGB  9.2*  9.6*  9.2*   --    --    HCT  27.9*  28.4*  29.8*   --    --    PLTCNT  179  183  193   --    --      Ordered:  None    Diagnostic Tests   Reviewed: N/A  Ordered:  None    Radiology Tests  Reviewed: N/A  Ordered:  None    Assessment/ Plan:   Active Hospital Problems   (*Primary Problem)     Diagnosis   . *Gastric cancer   . Pain at surgical incision   . Hypokalemia   . Tachycardia   . Hypophosphatasia   . Anemia, chronic disease   . Acute blood loss anemia   . Leukocytosis   . De Quervain's tenosynovitis, left   . Crohn disease     Pt was treated for Crohn's for 10 years, has not had any relapses and no steroids for more than a decade.     . Personal history of PE (pulmonary embolism)   . Anxiety   . Moderate protein-calorie malnutrition   . Chronic pain disorder   . Sarcoidosis     37 yo female s/p distal gastrectomy with RNY Gastrojejunostomy. Tumor T3N0    Decrease PCA; Continue Tylenol scheduled  Advance to clears  Plan to 1/2 TPN  Leukocytosis of 11.3 today  Continue Zofran 4 mg and Phenergan 25 mg for nausea   Continue daily labs: CBC, BMP  Lovenox 40 daily  Encouraged aggressive IS usage  Activity: Up and OOB as tolerated, ambulate in hall today  floor  TG on drain fluid is [address] PA-C 07/25/2012, 8:21 AM    I saw and examined this patient with the mid-level provider  above.  Please see the mid-level's note, which I have carefully reviewed, for full details. I agree with the findings and plan of care as documented in the mid-level's note.  Any additions/exceptions are edited/noted.    Looks well.  Much less anxious.  Tolerated clear liquid sips overnight.  On exam, HD stable (slightly hypertensive), abdomen soft, nondistended, slight tenderness to the right of the midline wound, no erythema, no drainage, wound intact with staples, drain serous.  Labs notable for WBC of 11, drain TGs of 45.  Plan for clear liquids, begin weaning TPN (would like to remove PICC as soon as possible).  Needs OOB/Ambulate.  Restart home atenolol and flexeril.  Wean PCA, start PO pain meds.    [name], MD 07/25/2012 11:05 AM  Assistant Professor, Division of Surgical Oncology  [facility] Department of Surgery

## 2012-07-25 NOTE — Nurses Notes (Signed)
Pt c/o foul odor from urine, requesting UA. Dr Georgianne Fick notified and ordered a series of urine test. Will monitor.

## 2012-07-25 NOTE — Nurses Notes (Signed)
Patient in chair, requesting a bath.  Encouraged to bathe self.  Independently except nurse washed back.  Patient keeps finding things for nurse to do and requesting one thing at a time.  Nurse in room with patient for 45 minutes.  Explained to patient that nurse had to go see other patients.  Now wants what ever other medications is due for her.  States "well when am I going to get whatever else I can have?  "  Advised nurse would check and let her know.  CA notified that patient would like to go for a walk but she is still up at the sink and checking items in closet etc..   Pollyann Glen, LPN 1/61/0960, 45:40 PM

## 2012-07-25 NOTE — Care Plan (Signed)
Problem: General Plan of Care(Adult,OB)  Goal: Plan of Care Review(Adult,OB)  The patient and/or their representative will communicate an understanding of their plan of care   Outcome: Ongoing (see interventions/notes)  Received referral to visit with Patient from Nurse Jasmine December this afternoon.    Visited with the Patient who shared some of her story. She stated that her faith is very, very important to her, and she values prayer.  She shared that she did have some frustration today resultant of an encounter with one of the hospital staff. She shared that she knows she needs to "move past" this so that she can focus on getting better.      Offered supportive presence to Ms. Michele Mason as she shared her story.  Shared with her that if she felt she was treated wrongly that she has the right to meet with a Patient Advocate. She stated that she did not want to do that at this time.  Per the Patient's request, offered prayer for grace, strength, peace, and healing.  She shared that she appreciates the care that she has received from the Spiritual Care Department.      Barb Merino  Pager: [phone]  Total Time of Encounter: 15 min.

## 2012-07-25 NOTE — Nurses Notes (Signed)
 Pt requesting me to take BP manual. 140/80. HR 80. 100% RA. Will monitor.

## 2012-07-25 NOTE — Nurses Notes (Signed)
1315  Notified per front desk patient needs nurse due to call light not working.  Patient co of CA not being in her room all today, patient reminded that she had a nursing student who was there to help all of her am needs today.  Patient not happy, keeps cutting nurse off from speaking when trying to explain or talk about what medications she needs.  When asked if nurse could speak, patient states " No, cause I don't want to hear it".  Manager notified of above.  Pollyann Glen, LPN 7/82/9562, 1:30 PM

## 2012-07-25 NOTE — Care Plan (Signed)
Problem: General Plan of Care(Adult,OB)  Goal: Plan of Care Review(Adult,OB)  The patient and/or their representative will communicate an understanding of their plan of care   Outcome: Ongoing (see interventions/notes)  When medically ready patient to discharge home with home health. Transportation available. Has support via friends per patient.    The patient will continue to be evaluated for developing discharge needs.     Case Manager: Harriet Masson, RN 07/25/2012, 11:36 AM  Phone: [phone]

## 2012-07-26 LAB — CBC/DIFF
BASOPHILS: 1 %
BASOS ABS: 0.043 10*3/uL (ref 0.0–0.2)
EOS ABS: 0.387 THOU/uL (ref 0.0–0.5)
EOSINOPHIL: 5 %
HCT: 26.4 % — ABNORMAL LOW (ref 33.5–45.2)
HGB: 8.7 g/dL — ABNORMAL LOW (ref 11.2–15.2)
LYMPHOCYTES: 15 %
LYMPHS ABS: 1.163 THOU/uL (ref 1.0–4.8)
MCH: 28.5 pg (ref 27.4–33.0)
MCHC: 32.8 g/dL (ref 32.5–35.8)
MCV: 87.1 fL (ref 78–100)
MONOCYTES: 12 %
MONOS ABS: 0.916 10*3/uL (ref 0.3–1.0)
MPV: 9.6 fL (ref 7.5–11.5)
PLATELET COUNT: 166 10*3/uL (ref 140–450)
PMN ABS: 5.134 THOU/uL (ref 1.5–7.7)
PMN'S: 67 %
RBC: 3.04 MIL/uL — ABNORMAL LOW (ref 3.63–4.92)
RDW: 15.4 % — ABNORMAL HIGH (ref 12.0–15.0)
WBC: 7.6 THOU/uL (ref 3.5–11.0)

## 2012-07-26 LAB — BASIC METABOLIC PANEL, FASTING
ANION GAP: 4 mmol/L — ABNORMAL LOW (ref 5–16)
BUN/CREAT RATIO: 28 — ABNORMAL HIGH (ref 6–22)
BUN: 17 mg/dL (ref 6–20)
CALCIUM: 8.8 mg/dL (ref 8.5–10.4)
CARBON DIOXIDE: 27 mmol/L (ref 22–32)
CHLORIDE: 105 mmol/L (ref 96–111)
CREATININE: 0.6 mg/dL (ref 0.49–1.10)
ESTIMATED GLOMERULAR FILTRATION RATE: >59 ml/min/1.73m2 (ref >59)
GLUCOSE, FASTING: 90 mg/dL (ref 70–105)
POTASSIUM: 4.2 mmol/L (ref 3.5–5.1)
SODIUM: 136 mmol/L (ref 136–145)

## 2012-07-26 LAB — PERFORM POC WHOLE BLOOD GLUCOSE: GLUCOSE, POINT OF CARE: 78 mg/dL (ref 70–105)

## 2012-07-26 MED ADMIN — ondansetron HCl (PF) 4 mg/2 mL injection solution: 4 mg | INTRAVENOUS | NDC 00703722101

## 2012-07-26 MED ADMIN — sodium chloride 0.9 % (flush) injection syringe: 0 mL

## 2012-07-26 MED ADMIN — sodium chloride 0.9 % (flush) injection syringe: 0 mL | NDC 08290306547

## 2012-07-26 MED ADMIN — heparin lock flush (porcine) 10 unit/mL intravenous solution: 0

## 2012-07-26 MED ADMIN — ondansetron HCl (PF) 4 mg/2 mL injection solution: 4 mg | INTRAVENOUS | NDC 00641607801

## 2012-07-26 MED ADMIN — heparin lock flush (porcine) 10 unit/mL intravenous solution: 0 mL

## 2012-07-26 MED FILL — parenteral amino acid 10 % combination no.6 intravenous solution: INTRAVENOUS | Qty: 480 | Status: AC

## 2012-07-26 NOTE — Care Plan (Addendum)
Problem: General Plan of Care(Adult,OB)  Goal: Plan of Care Review(Adult,OB)  The patient and/or their representative will communicate an understanding of their plan of care   Outcome: Ongoing (see interventions/notes)  Pt education and discharge planning in progress.  Will assess pain every four hours and encourage pt to use pca for pain control.  Will medicate with prn meds with pca is not providing adequate pain control.  Will encourage pt to increase ambulation; pt needs to walk four times per day in hallway.  Pt tolerating full liquid diet.  POC reviewed with pt.

## 2012-07-26 NOTE — Care Plan (Signed)
Problem: General Plan of Care(Adult,OB)  Goal: Plan of Care Review(Adult,OB)  The patient and/or their representative will communicate an understanding of their plan of care   Outcome: Ongoing (see interventions/notes)  Patient's family was leaving as I arrived they asked for prayer before they departed. Family seemed to be coping well and were hopeful for complete recovery/ healing. Patient expressed some fear that the cancer could come back, but stated that such fear is minor and from the enemy and that she will be fine as she has God in her corner. Patient shared her story from diagnosis forward and praised God for his work in her life. I celebrated the joy of a successful surgery and encouraging progress afterwards. I prayed with the patient and her family and was asked to keep the family / patient in my prayers as she receive chemo treatments.

## 2012-07-26 NOTE — Care Plan (Signed)
Problem: General Plan of Care(Adult,OB)  Goal: Plan of Care Review(Adult,OB)  The patient and/or their representative will communicate an understanding of their plan of care   Outcome: Ongoing (see interventions/notes)  When medically stable with discharge to home with [facility] services. Transportation available on discharge.    The patient will continue to be evaluated for developing discharge needs.     Case Manager: Harriet Masson, RN 07/26/2012, 10:37 AM

## 2012-07-26 NOTE — Nurses Notes (Signed)
Assessment per flowsheet.  Ambulated with assist of one for BRP and also in hall prior evening. on clear liquids po.  0504 medicated for c/o nausea.  VSS.  Stapled midline abdominal incision OTA.  RLQ JP drain has serosanguinous drainage, dressing changed at 0000.  Right chest infusaport incision steristripped.  RUE dual lumen PICC has TPN, MIVF, and dilaudid PCA infusing.  Also receiving roxicodone po for pain--see MAR.

## 2012-07-26 NOTE — Ancillary Notes (Addendum)
07/26/12 1117   Clinical Encounter Type   Reason for Visit General Spiritual Care Visit   Referral From Prayer Book   Declined Spiritual Care Visit At This Time No   Visited With Patient;Father  (Extended family)   Patient Spiritual Encounters   Spiritual Assessment Gratitude/Joy;Using Prayer and Faith for Support;Anxiety/Fear   Interventions Prayed;Offered empathy;Provided supportive presence;Facilitated story telling   Outcomes Spiritual Care relationship established;Patient shared/processed his/her own story;Patient identified values/goals   Patient's Goals/Hopes Wants to get pain under control. Wants to recieve Chemo and return to school in August   Other Support for Patient family   How far is the patient from home?  3.5 hours   Family Spiritual Encounters   Spiritual Assessment Using Prayer and Faith for Support;Hopeful   Interventions Prayed;Offered empathy;Provided supportive presence;Celebrated with patient   Outcomes Spiritual Care relationship established   How has patient's hospitalization affected family? family communiting when available to do so.    Family Coping through prayer and presence.    Values/Beliefs/Spiritual Care   (F) Faith: Importance of Culture, Spirituality, Religion In Life [place] faith and prayer are very important to patient.    Spiritual Care Comment Patient states taht God has been active in her surgery and subsequent healing.    [facility]  Spiritual Care Note    Patient Name:  Michele Mason  Date of Encounter:   07/26/2012     Other Pertinent Information: Patient's family was leaving as I arrived they asked for prayer before they departed. Family seemed to be coping well and were hopeful for complete recovery/ healing. Patient expressed some fear that the cancer could come back, but stated that such fear is minor and from the enemy and that she will be fine as she has God in her corner. Patient shared her story from diagnosis forward and praised God for his work in her life. I celebrated the joy of a successful surgery and encouraging progress afterwards. I prayed with the patient and her family and was asked to keep the family / patient in my prayers as she receive chemo treatments.     Kara Pacer, CR  Pager: [phone]  Total Time of Encounter: 45 min.  I have reviewed and cosigned this note as written.  Rev. Derl Barrow, Th.M., Clinical Pastoral Education Supervisor

## 2012-07-26 NOTE — Care Management Notes (Addendum)
[facility]  Care Management Note    Patient Name: Michele Mason  Date of Birth: Nov 08, 1975  Sex: female  Date/Time of Admission: 07/09/2012  1:37 PM  Room/Bed: 765/A  Payor: [place] MEDICAID  Plan: [facility] MEDICAID  Product Type: Medicaid     LOS: 17 days   PCP: No Established Pcp    Admitting Diagnosis:  gastric mass  gastric tumor  gastic cancer  Gastric cancer    Assessment:   CCC received call from Clydie Braun, RN intake, [facility] states that can accept patient on service.. CCC has informed patient. DC instruction entered in [place].    Discharge Plan:  Home with Home Health (code 6)  When medically stable with discharge to home with [facility] services. Transportation available on discharge.    The patient will continue to be evaluated for developing discharge needs.     Case Manager: Harriet Masson, RN 07/26/2012, 10:37 AM  Phone: [phone]    [facility] entered Home Health orders, pended in [place]. Awaiting signature.

## 2012-07-26 NOTE — Progress Notes (Addendum)
[facility]                                                  SURG-ONCOLOGY PROGRESS NOTE    Michele Mason, Michele Mason, 37 y.o. female  Date of Admission:  07/09/2012  Date of Birth:  [birth year]  Date of Service:  07/26/2012    Post Op Day: 2 Days Post-Op S/P Procedure(s) (LRB):  INSERTION PORT SUBCUTANEOUS (Right)    Subjective: Had a bowel movement this AM. States that it was a lot and "can't believe I had that much in me." States that she would like an increase in her pain meds. Denies nausea or emesis at this time. No acute events overnight.     Vital Signs:  Temp (24hrs) Max:37.2 C (99 F)      Systolic (24hrs), Avg:119 mmHg, Min:108 mmHg, Max:130 mmHg    Diastolic (24hrs), Avg:78 mmHg, Min:72 mmHg, Max:85 mmHg    Temp  Avg: 36.8 C (98.2 F)  Min: 36.5 C (97.7 F)  Max: 37.2 C (99 F)  Pulse  Avg: 82.8  Min: 75  Max: 95  Resp  Avg: 18.2  Min: 18  Max: 19  SpO2  Avg: 100 %  Min: 100 %  Max: 100 %  Pain Score (Numeric, Faces): 5    Current Medications:    Current Facility-Administered Medications:  adult custom parenteral nutrition  Intravenous Continuous   adult custom parenteral nutrition  Intravenous Continuous   atenolol (TENORMIN) tablet 12.5 mg Oral Daily   cyclobenzaprine (FLEXERIL) tablet 10 mg Oral 3x/day PRN   diphenhydrAMINE (BENADRYL) 50 mg/mL injection 25 mg Intravenous Q6H PRN   enoxaparin (LOVENOX) 40 mg/0.4 mL SubQ injection 40 mg Subcutaneous Q24H   esomeprazole (NEXIUM) capsule 40 mg Oral Daily before Breakfast   heparin flush (HEPFLUSH) 10 units/mL injection 2-6 mL Intracatheter Q8HRS   heparin flush (HEPFLUSH) 10 units/mL injection 2 mL Intracatheter Q1 MIN PRN   HYDROmorphone (DILAUDID) 1 mg/mL (tot vol 100 mL) in NS PCA 1 Bag Intravenous Q1H PRN   HYDROmorphone (DILAUDID) 1 mg/mL injection 0.4 mg Intravenous Q4H PRN   LR premix infusion  Intravenous Continuous   magnesium hydroxide (MILK OF MAGNESIA) 400mg  per 5mL oral liquid 30 mL Oral NIGHTLY    NS flush syringe 10-30 mL Intracatheter Q8HRS   NS flush syringe 20-30 mL Intracatheter Q1 MIN PRN   NS flush syringe 2 mL Intracatheter Q8HRS   And      NS flush syringe 2-6 mL Intracatheter Q1 MIN PRN   ondansetron (ZOFRAN) 2 mg/mL injection 4 mg Intravenous Q6H PRN   oxyCODONE concentrate (ROXICODONE INTENSOL) 20mg  per mL oral liquid 10 mg Oral Q4H PRN   sennosides-docusate sodium (SENOKOT-S) 8.6-50mg  per tablet 1 Tab Oral 2x/day       Today's Physical Exam:  General: no distress and appears in significant discomfort   Lungs: Clear to auscultation bilaterally.   Cardiovascular: regular rate and rhythm.   Chest: right sided Infusaport incision site C/D/I  Abdomen: non-distended, soft, appropriately tender to palpation, dressing removed, incision c/d/i, staples in place, bowel sounds normal. JP in place with minimal serous fluid in bulb  Extremities: No cyanosis or edema   Neurologic: Alert and oriented x3    I/O:  I/O last 24 hours:    Intake/Output Summary (Last 24 hours) at 07/26/12 1219  Last data  filed at 07/26/12 1023   Gross per 24 hour   Intake   2169 ml   Output   2734 ml   Net   -565 ml     I/O current shift:  02/12 0800 - 02/12 1559  In: -   Out: 450 [Urine:450]    Prophylaxis:  Date Started Date Completed   DVT/PE  Lovenox and SCDs/ Venodynes     GI Nexium         Nutrition/Residuals:  Full Liquid    Labs    Reviewed:   Recent Labs      07/24/12   0054  07/25/12   0026  07/25/12   0031  07/25/12   0122  07/26/12   0050   SODIUM  135*   --    --   136  136   POTASSIUM  3.8   --    --   4.0  4.2   CHLORIDE  104   --    --   105  105   BUN  13   --    --   16  17   CREATININE  0.51   --    --   0.52  0.60   GLUCOSE   --    --   NEGATIVE   --    --    ANIONGAP  5   --    --   6  4*   CALCIUM  8.9   --    --   8.8  8.8   MAGNESIUM  1.9   --    --    --    --    PHOSPHORUS  4.0   --    --    --    --    WBC  10.0  11.3*   --    --   7.6   HGB  9.6*  9.2*   --    --   8.7*    HCT  28.4*  29.8*   --    --   26.4*   PLTCNT  183  193   --    --   166       Recent Labs      07/24/12   1903  07/25/12   0031   BILIRUBIN   --   NEGATIVE   ALBUMIN  2.6*   --    PREALBUMIN  20.7   --      Assessment/ Plan:   Active Hospital Problems   (*Primary Problem)    Diagnosis   . *Gastric cancer   . Pain at surgical incision   . Hypokalemia   . Tachycardia   . Hypophosphatasia   . Anemia, chronic disease   . Acute blood loss anemia   . Leukocytosis   . De Quervain's tenosynovitis, left   . Crohn disease     Pt was treated for Crohn's for 10 years, has not had any relapses and no steroids for more than a decade.     . Personal history of PE (pulmonary embolism)   . Anxiety   . Moderate protein-calorie malnutrition   . Chronic pain disorder   . Sarcoidosis     37 y.o. Female s/p Distal Gastrectomy with RNY Gastrojejunostomy and  Infusaport placement in right chest  Continue Flexeril, Roxicodone, 0.4 mg Dilaudid, and Tylenol scheduled for pain management  Advanced to full liquid diet   Continue 1/2 TPN  as patient increases diet  No leukocytosis today, 7.6  Continue Zofran 4 mg and Phenergan 25 mg for nausea   Continue daily labs: CBC, BMP   Continue Lovenox 40 daily and SCDs for DVT prophylaxis  Encouraged aggressive IS usage   Activity: Up and OOB at least 4x/day    Nsisong Sheran Fava, MD 07/26/2012, 12:19 PM    I saw and examined this patient with the resident above.  Please see the resident's note, which I have carefully reviewed, for full details. I agree with the findings and plan of care as documented in the resident's note.  Any additions/exceptions are edited/noted.    Looks well.  She is progressing nicely.  Plan for full liquids today.  OOB.  Pain control.  Recheck TGs from drain once on post-gastrectomy diet - can remove if normal.    [name], MD 07/26/2012 10:29 PM  Assistant Professor, Division of Surgical Oncology  [facility] Department of Surgery

## 2012-07-26 NOTE — CDI REVIEW (Signed)
 Admissions, Findings, and Consultations:  2/10 Surg Onc: Gastric cancer, HypOK, Hypophosphatasia, anemia chr dz, Acute blood loss anemia, Pt was treated for Crohn's for 10 years, has not had any relapses and no steroids for more than a decade, anxiety  2/11 Surg Onc: same   Diagnostics and Results:   Procedures and Treatments:  2/10 [place]   Meds, IV's, Rx Blood:   Comments:

## 2012-07-27 LAB — ALBUMIN: ALBUMIN: 2.6 g/dL — ABNORMAL LOW (ref 3.5–4.8)

## 2012-07-27 LAB — BASIC METABOLIC PANEL, FASTING
ANION GAP: 5 mmol/L (ref 5–16)
BUN/CREAT RATIO: 24 — ABNORMAL HIGH (ref 6–22)
BUN: 15 mg/dL (ref 6–20)
CALCIUM: 9.2 mg/dL (ref 8.5–10.4)
CARBON DIOXIDE: 29 mmol/L (ref 22–32)
CHLORIDE: 101 mmol/L (ref 96–111)
CREATININE: 0.62 mg/dL (ref 0.49–1.10)
ESTIMATED GLOMERULAR FILTRATION RATE: >59 mL/min/{1.73_m2} (ref >59)
GLUCOSE, FASTING: 106 mg/dL — ABNORMAL HIGH (ref 70–105)
POTASSIUM: 4.7 mmol/L (ref 3.5–5.1)
SODIUM: 135 mmol/L — ABNORMAL LOW (ref 136–145)

## 2012-07-27 LAB — CBC/DIFF
BASOPHILS: 1 %
BASOS ABS: 0.043 THOU/uL (ref 0.0–0.2)
EOS ABS: 0.603 THOU/uL — ABNORMAL HIGH (ref 0.0–0.5)
EOSINOPHIL: 7 %
HCT: 27.3 % — ABNORMAL LOW (ref 33.5–45.2)
HGB: 8.9 g/dL — ABNORMAL LOW (ref 11.2–15.2)
LYMPHOCYTES: 15 %
LYMPHS ABS: 1.259 10*3/uL (ref 1.0–4.8)
MCH: 28.2 pg (ref 27.4–33.0)
MCHC: 32.6 g/dL (ref 32.5–35.8)
MCV: 86.5 fL (ref 78–100)
MONOCYTES: 15 %
MONOS ABS: 1.282 THOU/uL — ABNORMAL HIGH (ref 0.3–1.0)
MPV: 10 fL (ref 7.5–11.5)
PLATELET COUNT: 174 THOU/uL (ref 140–450)
PMN ABS: 5.265 10*3/uL (ref 1.5–7.7)
PMN'S: 62 %
RBC: 3.15 MIL/uL — ABNORMAL LOW (ref 3.63–4.92)
RDW: 15.4 % — ABNORMAL HIGH (ref 12.0–15.0)
WBC: 8.5 10*3/uL (ref 3.5–11.0)

## 2012-07-27 LAB — PERFORM POC WHOLE BLOOD GLUCOSE
GLUCOSE, POINT OF CARE: 91 mg/dL (ref 70–105)
GLUCOSE, POINT OF CARE: 87 mg/dL (ref 70–105)
GLUCOSE, POINT OF CARE: 94 mg/dL (ref 70–105)

## 2012-07-27 LAB — PREALBUMIN: PREALBUMIN: 21.1 mg/dL (ref 18–40)

## 2012-07-27 MED ORDER — DOCUSATE SODIUM 100 MG CAPSULE
100.0000 mg | ORAL_CAPSULE | Freq: Two times a day (BID) | ORAL | Status: DC
Start: 2012-07-27 — End: 2012-08-01
  Administered 2012-07-27 (×3): 100 mg via ORAL
  Administered 2012-07-28 – 2012-08-01 (×9): 0 mg via ORAL
  Filled 2012-07-27 (×12): qty 1

## 2012-07-27 MED ADMIN — sodium chloride 0.9 % (flush) injection syringe: 0 mL

## 2012-07-27 MED ADMIN — ZZ IMS TEMPLATE: 15 mg | ORAL | NDC 00378075101

## 2012-07-27 MED ADMIN — oxyCODONE 20 mg/mL oral concentrate: 15 mg | ORAL | NDC 09991081205

## 2012-07-27 MED ADMIN — ondansetron HCl (PF) 4 mg/2 mL injection solution: 4 mg | INTRAVENOUS | NDC 00641607801

## 2012-07-27 MED ADMIN — sodium chloride 0.9 % (flush) injection syringe: 2 mL | NDC 08290306547

## 2012-07-27 MED ADMIN — cyclobenzaprine 5 mg tablet: 15 mg | ORAL | NDC 00591325601

## 2012-07-27 MED ADMIN — heparin lock flush (porcine) 10 unit/mL intravenous solution: 0

## 2012-07-27 MED FILL — cyclobenzaprine 5 mg tablet: 15.0000 mg | ORAL | Qty: 1 | Status: AC

## 2012-07-27 MED FILL — oxyCODONE 20 mg/mL oral concentrate: 15.0000 mg | ORAL | Qty: 1 | Status: AC

## 2012-07-27 NOTE — Nurses Notes (Signed)
Pt placed on gastrectomy diet.  Pt upset that dietary will not send food that she asks for.  Dr. Lance Bosch notified.  MD stated that pt needs to remain on gastrectomy diet.  Multiple attempts to contact nutrition services to find out what foods pt can and cannot have.  Will monitor.

## 2012-07-27 NOTE — Care Plan (Signed)
 Problem: General Plan of Care(Adult,OB)  Goal: Plan of Care Review(Adult,OB)  The patient and/or their representative will communicate an understanding of their plan of care   Outcome: Ongoing (see interventions/notes)  Plan of care reviewed with patient. Pain controlled with prn medication and dilaudid  pca.  Patient ambulating in room with minimal assist. Midline incision intact with staples. JP to Rt abdomen having serosanguinous output. Full liquid diet being tolerated appropriately. TPN continued. D/C planning continues throughout hospitalization.

## 2012-07-27 NOTE — Ancillary Notes (Addendum)
 07/27/12 1430   Clinical Encounter Type   Reason for Visit General Spiritual Care Visit   Referral From Nurse manager, Reena Martes Spiritual Care Visit At This Time No   Visited With Patient   Patient Spiritual Encounters   Spiritual Assessment Hopeful;Using Prayer and Faith for Support   Interventions Provided supportive presence;Facilitated story telling   Outcomes Spiritual Care relationship established;Patient identified values/goals   Patient's Goals/Hopes Patient is looking forward to solid foods and a discharge next week.    Values/Beliefs/Spiritual Care   (F) Faith: Importance of Culture, Spirituality, Religion In Life Christian faith   [facility]  Spiritual Care Note    Patient Name:  Michele Mason  Date of Encounter:   07/27/2012    Other Pertinent Information: Patient was in a good mood and mostly spoke about her new dietary permission and spoke very hopefully of the idea of seafood and burgers. She is encouraged by her continued improvement and is preparing to go home early next week.       Fairy KANDICE Pizza, CR  Pager: [phone]  Total Time of Encounter: 12 min.  I have reviewed and cosigned this note as written.  Rev. Ronal HILARIO Duty, Th.M., Clinical Pastoral Education Supervisor

## 2012-07-27 NOTE — Ancillary Notes (Signed)
Medical Nutrition Therapy Follow Up       Reason for Assessment: Follow up    SUBJECTIVE : Pt reports feeling nauseous without vomiting. She states feeling pain and notices it especially when she eats. Despite this she has tried to consume at least 50% of her meals. She is interested in trying Ensure Complete in chocolate.   Pt did not have any further questions or concerns at this time.       OBJECTIVE:  Current Diet Order/Nutrition Support:  Post Gastrectomy with dietary supplement Ensure Clear Berry TID, TPN to end after today per discussion with MD. TPN currently providing 867 cal (17 cal/kg), 48 g prot (1 g/kg), 610 dextrose cal (GIR 2), 65 IL (8% of total cal) at 45 ml/hr    Height: 154 cm   Weight trends: (1/28- standing weight) 59.4 kg (used this wt for assessment), 1/31- 62.7 kg, 2/2- 62.8 kg , 2/8- 71.8 kg, 2/11- 61.7 kg, 2/13- 61.6 kg  IBW: 47.7 kg %IBW: 125 % Adj BW: 51 kg  UBW: 66.3 kg (9 weeks ago) %UBW: 89 % Weight change: has decreased 7 kg or 10.5 % in 9 weeks     Physical Assessment:  Overall Physical Appearance: resting in bed  GI: last BM 2/12 that was loose  Tubes: blake drain in abdomen  Skin: surgical incision; mid abdomen    Labs:   BMP:     Recent Labs      07/27/12   0239   SODIUM  135*   POTASSIUM  4.7   CHLORIDE  101   CO2  29   BUN  15   CREATININE  0.62   GLUCOSEFAST  106*   ANIONGAP  5   BUNCRRATIO  24*   GFR  >59   CALCIUM  9.2     Point of Care Glucose Last 24 Hr Results:    Recent Labs      07/26/12   2119  07/27/12   0611   GLUCOSEPOC  78  91       MNT Labs:     Albumin/Prealbumin/CRP:     ALBUMIN   Date Value Range Status   07/27/2012 2.6* 3.5 - 4.8 g/dL Final   1/61/0960 2.6* 3.5 - 4.8 g/dL Final   09/16/4096 2.7* 3.5 - 4.8 g/dL Final        PREALBUMIN   Date Value Range Status   07/27/2012 21.1  18 - 40 mg/dL Final   07/02/1476 29.5  18 - 40 mg/dL Final   11/14/1306 65.7* 18 - 40 mg/dL Final     H&H:    HGB   Date Value Range Status   07/27/2012 8.9* 11.2 - 15.2 g/dL Final        HCT      Date Value Range Status   07/27/2012 27.3* 33.5 - 45.2 % Final       Meds:   Current Facility-Administered Medications:  atenolol (TENORMIN) tablet 12.5 mg Oral Daily   cyclobenzaprine (FLEXERIL) tablet 10 mg Oral 3x/day PRN   diphenhydrAMINE (BENADRYL) 50 mg/mL injection 25 mg Intravenous Q6H PRN   docusate sodium (COLACE) capsule 100 mg Oral 2x/day   enoxaparin (LOVENOX) 40 mg/0.4 mL SubQ injection 40 mg Subcutaneous Q24H   esomeprazole (NEXIUM) capsule 40 mg Oral Daily before Breakfast   HYDROmorphone (DILAUDID) 1 mg/mL injection 0.4 mg Intravenous Q4H PRN   LR premix infusion  Intravenous Continuous   magnesium hydroxide (MILK OF MAGNESIA) 400mg  per 5mL oral liquid  30 mL Oral NIGHTLY   NS flush syringe 2 mL Intracatheter Q8HRS   And      NS flush syringe 2-6 mL Intracatheter Q1 MIN PRN   ondansetron (ZOFRAN) 2 mg/mL injection 4 mg Intravenous Q6H PRN   oxyCODONE concentrate (ROXICODONE INTENSOL) 20mg  per mL oral liquid 15 mg Oral Q4H PRN   sennosides-docusate sodium (SENOKOT-S) 8.6-50mg  per tablet 1 Tab Oral 2x/day       ASSESSMENT:    Re-assessed needs if applicable:  Calories: 30-33 Cals/59.4 kg = 1550-1700 calories/day   Protein: 1.5-2 g/59.4 kg = 89-119 grams protein/day   Fluid: 30-33 mLs/59.4kg = 1550-1700 mLs/day        PLAN / INTERVENTION        Goals:  Oral Intake: greater than/equal to 75% of meals, Meet estimated needs for nutrients/fluids, Wound Healing, and prevent further weight loss       Monitor / Evaluate: Monitor: I/O's, Oral Intake, Supplement Intake, Weight Status and Pertinent Labs    Tolerance of : diet    Recommend :   1. Allow TPN to expire per MD.  2. Continue current Post Gastrectomy diet and monitor intake  3. Recommend Ensure Complete, chocolate BID  4. Recommend multivitamin  5. Monitor labs  6. Recheck weight daily  7. 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      Terisa Starr, Dietetic Intern 07/27/2012, 11:21 AM

## 2012-07-28 LAB — CBC/DIFF
BASOPHILS: 1 %
BASOS ABS: 0.04 THOU/uL (ref 0.0–0.2)
EOS ABS: 0.5 THOU/uL (ref 0.0–0.5)
EOSINOPHIL: 6 %
HCT: 25.9 % — ABNORMAL LOW (ref 33.5–45.2)
HGB: 8.6 g/dL — ABNORMAL LOW (ref 11.2–15.2)
LYMPHOCYTES: 14 %
LYMPHS ABS: 1.18 THOU/uL (ref 1.0–4.8)
MCH: 28.6 pg (ref 27.4–33.0)
MCHC: 33.1 g/dL (ref 32.5–35.8)
MCV: 86.3 fL (ref 78–100)
MONOCYTES: 14 %
MONOS ABS: 1.142 THOU/uL — ABNORMAL HIGH (ref 0.3–1.0)
MPV: 10.2 fL (ref 7.5–11.5)
PLATELET COUNT: 165 10*3/uL (ref 140–450)
PMN ABS: 5.4 10*3/uL (ref 1.5–7.7)
PMN'S: 65 %
RBC: 3.01 MIL/uL — ABNORMAL LOW (ref 3.63–4.92)
RDW: 15 % (ref 12.0–15.0)
WBC: 8.3 THOU/uL (ref 3.5–11.0)

## 2012-07-28 LAB — BASIC METABOLIC PANEL, FASTING
ANION GAP: 6 mmol/L (ref 5–16)
BUN/CREAT RATIO: 23 — ABNORMAL HIGH (ref 6–22)
BUN: 16 mg/dL (ref 6–20)
CALCIUM: 9 mg/dL (ref 8.5–10.4)
CARBON DIOXIDE: 29 mmol/L (ref 22–32)
CHLORIDE: 100 mmol/L (ref 96–111)
CREATININE: 0.71 mg/dL (ref 0.49–1.10)
ESTIMATED GLOMERULAR FILTRATION RATE: >59 ml/min/1.73m2 (ref >59)
GLUCOSE, FASTING: 101 mg/dL (ref 70–105)
POTASSIUM: 4.6 mmol/L (ref 3.5–5.1)
SODIUM: 135 mmol/L — ABNORMAL LOW (ref 136–145)

## 2012-07-28 LAB — PERFORM POC WHOLE BLOOD GLUCOSE
GLUCOSE, POINT OF CARE: 67 mg/dL — ABNORMAL LOW (ref 70–105)
GLUCOSE, POINT OF CARE: 117 mg/dL — ABNORMAL HIGH (ref 70–105)
GLUCOSE, POINT OF CARE: 89 mg/dL (ref 70–105)
GLUCOSE, POINT OF CARE: 96 mg/dL (ref 70–105)
GLUCOSE, POINT OF CARE: 85 mg/dL (ref 70–105)

## 2012-07-28 LAB — TRIGLYCERIDE BODY FLUID: TRIGLYCERIDE, FLUID: 66 mg/dL

## 2012-07-28 MED ORDER — OXYCODONE ER 10 MG TABLET,EXTENDED RELEASE,12 HR
20.0000 mg | ORAL_TABLET | Freq: Two times a day (BID) | ORAL | Status: DC
Start: 2012-07-28 — End: 2012-08-01
  Administered 2012-07-28 (×2): 0 mg via ORAL
  Administered 2012-07-29 – 2012-08-01 (×7): 20 mg via ORAL
  Filled 2012-07-28 (×8): qty 2

## 2012-07-28 MED ORDER — BENZOCAINE 7.5 % MUCOSAL GEL
Freq: Four times a day (QID) | Status: DC | PRN
Start: 2012-07-28 — End: 2012-08-01
  Filled 2012-07-28 (×3): qty 10

## 2012-07-28 MED ORDER — HYDROMORPHONE 1 MG/ML INJECTION WRAPPER
0.2000 mg | INJECTION | Freq: Once | INTRAMUSCULAR | Status: AC
Start: 2012-07-28 — End: 2012-07-28
  Administered 2012-07-28: 0.2 mg via INTRAVENOUS
  Filled 2012-07-28: qty 1

## 2012-07-28 MED ORDER — ACETAMINOPHEN 325 MG TABLET
650.0000 mg | ORAL_TABLET | Freq: Four times a day (QID) | ORAL | Status: DC | PRN
Start: 2012-07-28 — End: 2012-08-01
  Administered 2012-07-30 – 2012-08-01 (×4): 650 mg via ORAL
  Filled 2012-07-28 (×4): qty 2

## 2012-07-28 MED ORDER — ONDANSETRON HCL (PF) 4 MG/2 ML INJECTION SOLUTION
4.0000 mg | Freq: Once | INTRAMUSCULAR | Status: AC
Start: 2012-07-28 — End: 2012-07-28
  Administered 2012-07-28: 4 mg via INTRAVENOUS
  Filled 2012-07-28: qty 2

## 2012-07-28 MED ADMIN — ZZ IMS TEMPLATE: 15 mg | ORAL | NDC 00378075101

## 2012-07-28 MED ADMIN — ondansetron HCl (PF) 4 mg/2 mL injection solution: 4 mg | INTRAVENOUS | NDC 00409475503

## 2012-07-28 MED ADMIN — sodium chloride 0.9 % (flush) injection syringe: 2 mL | NDC 08290306547

## 2012-07-28 MED ADMIN — sodium chloride 0.9 % (flush) injection syringe: 0 mL

## 2012-07-28 MED ADMIN — oxyCODONE 20 mg/mL oral concentrate: 20 mg | ORAL | NDC 09991081205

## 2012-07-28 MED ADMIN — oxyCODONE 20 mg/mL oral concentrate: 15 mg | ORAL | NDC 09991081205

## 2012-07-28 MED ADMIN — cyclobenzaprine 5 mg tablet: 15 mg | ORAL | NDC 00378075101

## 2012-07-28 MED FILL — bacitracin zinc 500 unit/gram topical ointment in packet: CUTANEOUS | Qty: 1 | Status: AC

## 2012-07-28 MED FILL — oxyCODONE 20 mg/mL oral concentrate: 20.0000 mg | ORAL | Qty: 1 | Status: AC

## 2012-07-28 NOTE — Care Plan (Signed)
Problem: General Plan of Care(Adult,OB)  Goal: Plan of Care Review(Adult,OB)  The patient and/or their representative will communicate an understanding of their plan of care   Outcome: Ongoing (see interventions/notes)  Plan of care reviewed with pt. Pt A&O. Midline incision intact with staples, open to air. JP drain on RLQ removed. PRN Roxicodone and dilaudid given for pain control. MIVF running. PRN Zofran given for nausea. D/C planning in progress. Will continue to monitor.

## 2012-07-28 NOTE — Nurses Notes (Signed)
Pt is requesting Orajel for c/o tooth pain. Dr. Lance Bosch notified. Awaiting potential orders. Will continue to monitor.

## 2012-07-28 NOTE — Care Management Notes (Signed)
[facility]  Care Management Note    Patient Name: Michele Mason  Date of Birth: Jul 27, 1975  Sex: female  Date/Time of Admission: 07/09/2012  1:37 PM  Room/Bed: 765/A  Payor: [place] MEDICAID  Plan: [facility] MEDICAID  Product Type: Medicaid     LOS: 19 days   PCP: No Established Pcp    Admitting Diagnosis:  gastric mass  gastric tumor  gastic cancer  Gastric cancer    Assessment:   Patient admitted s/p gastrectomy.  Patient is tolerating advancement of diet.  Patient continues to require pain management. MSW will follow for discharge needs.     07/28/12 1414   Assessment Details   Assessment Type Continued Assessment   Date of Care Management Update 07/28/12   Date of Next DCP Update 08/01/12   Care Management Plan   Discharge Planning Status plan in progress   Discharge Needs Assessment   Discharge Facility/Level of Care Needs Home with Home Health (code 6)       Discharge Plan:  Home with Home Health (code 6)      The patient will continue to be evaluated for developing discharge needs.     Case Manager: Nicole Kindred, MSW 07/28/2012, 2:15 PM  Phone: [phone]

## 2012-07-28 NOTE — Care Plan (Signed)
Problem: General Plan of Care(Adult,OB)  Goal: Plan of Care Review(Adult,OB)  The patient and/or their representative will communicate an understanding of their plan of care   Outcome: Ongoing (see interventions/notes)  Patient admitted s/p gastrectomy.  Patient is tolerating advancement of diet.  Patient continues to require pain management. MSW will follow for discharge needs.

## 2012-07-28 NOTE — CDI REVIEW (Signed)
Admissions, Findings, and Consultations:  2/12 Surg Onc: same  2/13 Surg Onc: same   Diagnostics and Results:   Procedures and Treatments:   Meds, IV's, Rx Blood:   Comments:

## 2012-07-28 NOTE — Progress Notes (Addendum)
[facility]                                                  SURG-ONCOLOGY PROGRESS NOTE    Michele Mason, Michele Mason, 37 y.o. female  Date of Admission:  07/09/2012  Date of Birth:  1976-05-08  Date of Service:  07/28/2012    Post Op Day: 4 Days Post-Op S/P Procedure(s) (LRB):  INSERTION PORT SUBCUTANEOUS (Right)    Subjective:  States that she is still having pain.  No acute events overnight.     Vital Signs:  Temp (24hrs) Max:37.2 C (99 F)      Systolic (24hrs), Avg:97 mmHg, Min:90 mmHg, Max:103 mmHg    Diastolic (24hrs), Avg:62 mmHg, Min:58 mmHg, Max:67 mmHg    Temp  Avg: 37 C (98.6 F)  Min: 36.8 C (98.2 F)  Max: 37.2 C (99 F)  Pulse  Avg: 91.4  Min: 88  Max: 93  Resp  Avg: 17.6  Min: 17  Max: 18  SpO2  Avg: 98 %  Min: 98 %  Max: 98 %  MAP (Non-Invasive)  Avg: 77 mmHG  Min: 75 mmHG  Max: 79 mmHG  Pain Score (Numeric, Faces): 8    Current Medications:    Current Facility-Administered Medications:  acetaminophen (TYLENOL) tablet 650 mg Oral Q6H PRN   atenolol (TENORMIN) tablet 12.5 mg Oral Daily   bacitracin zinc topical ointment ---Cabinet Override      benzocaine 7.5% oral gel  Mucous Membrane 4x/day PRN   cyclobenzaprine (FLEXERIL) tablet 15 mg 15 mg Oral 3x/day PRN   diphenhydrAMINE (BENADRYL) 50 mg/mL injection 25 mg Intravenous Q6H PRN   docusate sodium (COLACE) capsule 100 mg Oral 2x/day   enoxaparin (LOVENOX) 40 mg/0.4 mL SubQ injection 40 mg Subcutaneous Q24H   esomeprazole (NEXIUM) capsule 40 mg Oral Daily before Breakfast   HYDROmorphone (DILAUDID) 1 mg/mL injection 0.4 mg Intravenous Q4H PRN   LR premix infusion  Intravenous Continuous   magnesium hydroxide (MILK OF MAGNESIA) 400mg  per 5mL oral liquid 30 mL Oral NIGHTLY   NS flush syringe 2 mL Intracatheter Q8HRS   And      NS flush syringe 2-6 mL Intracatheter Q1 MIN PRN   ondansetron (ZOFRAN) 2 mg/mL injection 4 mg Intravenous Q6H PRN   oxyCODONE (OXYCONTIN) 12 hr extended release tablet 20 mg Oral Q12H    oxyCODONE concentrate (ROXICODONE INTENSOL) 20mg  per mL oral liquid 20 mg Oral Q3H PRN   sennosides-docusate sodium (SENOKOT-S) 8.6-50mg  per tablet 1 Tab Oral 2x/day       Today's Physical Exam:  General: no distress and resting comfortably  Lungs: clear to auscultation bilaterally.   Cardiovascular: regular rate and rhythm.   Chest: right sided Infusaport incision site C/D/I   Abdomen: non-distended, soft, appropriately tender to palpation, incision c/d/i, staples in place, bowel sounds normal. JP in place with minimal serous fluid in bulb   Extremities: No cyanosis or edema   Neurologic: Alert and oriented x3    I/O:  I/O last 24 hours:      Intake/Output Summary (Last 24 hours) at 07/28/12 1129  Last data filed at 07/28/12 1000   Gross per 24 hour   Intake    925 ml   Output      0 ml   Net    925 ml     I/O  current shift:  02/14 0800 - 02/14 1559  In: 180 [P.O.:180]  Out: 0     Prophylaxis:  Date Started Date Completed   DVT/PE  Lovenox and SCDs/ Venodynes     GI Colace, Senokot, Milk of Mag         Nutrition/Residuals:  Regular    Labs   Reviewed:   Recent Labs      07/26/12   0050  07/27/12   0239  07/28/12   0027   SODIUM  136  135*  135*   POTASSIUM  4.2  4.7  4.6   CHLORIDE  105  101  100   BUN  17  15  16    CREATININE  0.60  0.62  0.71   ANIONGAP  4*  5  6   CALCIUM  8.8  9.2  9.0   WBC  7.6  8.5  8.3   HGB  8.7*  8.9*  8.6*   HCT  26.4*  27.3*  25.9*   PLTCNT  166  174  165       Recent Labs      07/27/12   0239   ALBUMIN  2.6*   PREALBUMIN  21.1     Assessment/ Plan:   Active Hospital Problems   (*Primary Problem)    Diagnosis   . *Gastric cancer   . Pain at surgical incision   . Hypokalemia   . Tachycardia   . Hypophosphatasia   . Anemia, chronic disease   . Acute blood loss anemia   . Leukocytosis   . De Quervain's tenosynovitis, left   . Crohn disease     Pt was treated for Crohn's for 10 years, has not had any relapses and no steroids for more than a decade.      . Personal history of PE (pulmonary embolism)   . Anxiety   . Moderate protein-calorie malnutrition   . Chronic pain disorder   . Sarcoidosis   37 y.o. Female s/p Distal Gastrectomy with RNY Gastrojejunostomy and Infusaport placement in right chest   Continue Flexeril, Roxicodone, 0.4 mg Dilaudid, and Tylenol scheduled for pain management,  Will add oxy ER  Will increase Roxicodone to 20mg   Regular diet with small frequent meals  Continue Colace, Milk of Mag, and Senokot for bowel aid  Continue Zofran 4 mg and Phenergan 25 mg for nausea   Discontinue daily labs  Drain TG: 66  Continue Lovenox 40 daily and SCDs for DVT prophylaxis   Encouraged aggressive IS usage   Activity: Up and OOB at least 4x/day  Plan for discharge home on Monday    [facility], PA-C 07/28/2012, 11:34 AM    I saw and examined this patient with the mid-level provider above.  Please see the mid-level's note, which I have carefully reviewed, for full details. I agree with the findings and plan of care as documented in the mid-level's note.  Any additions/exceptions are edited/noted.    Doing well.  Pain improving.  Drain TGs 66.  On exam, HD normal, abdomen benign, incision intact with staples, drain serous.  Plan for pain team assistance for pain control, small frequent meals, bowel regimen, OOB, d/c drain, d/c TPN.  Plan for discharge home with home nurses on Monday.    [name], MD 07/28/2012 9:04 PM  Assistant Professor, Division of Surgical Oncology  [facility]

## 2012-07-28 NOTE — Nurses Notes (Signed)
Pt's dual lumen PICC D/C'd at 0930 to pt's RUE. Pt tolerated well. No bleeding at site . 2X 2 bacitracin and occlusive op site applied. Total length of catheter 43 cm. Will continue to monitor.

## 2012-07-28 NOTE — Nurses Notes (Signed)
 FS 65. Orange juice and crackers given to pt. Dr. Rhenda notified.

## 2012-07-28 NOTE — Consults (Addendum)
[facility]  Pain Resource Team  Follow Up    Michele Mason  [id number]  Sep 11, 1975    Date of Service:  07/28/2012  Requesting MD: Surg Onc  Chief Complaint/Reason for consult: Revisit for pain control w/ oral regimen      Subjective:   Michele Mason states she is doing well, but that her pain has not been that well controlled since discontinuation of PCA .She has not felt much relief from Roxicodone liquid, dose 10 mg increased to 15 mg. She reports she has gotten the most relief from Flexeril. She does receive relief from IV Dilaudid. She reports that she has been requesting that she receives flexeril with Roxicodone and has been also asking for IV Dilaudid and Roxicodone at the same time.     She said that she has been getting out of bed and has walked the halls as requested of her. Regarding premedicating prior to activity, she said that she requests pain medication after walking, because that is when she is in the most pain and needs it.     We reviewed that service would like to start a long-acting agent again, which I agree would be a good plan. She said that what she was taking before surgery won't be good for the pain that she is currently experiencing because it is so much worse. We discussed that she is also receiving less pain medications than she was prior to surgery, which may be the reason for the increased pain.    Her pain is currently located in generalized abdominal area and extends around each side into her back. It is a dull, continuous ache. The pain is increased when she eats for ~15 minutes and she describes it as though she can feel the food moving through her. She had lobster for dinner last night after recently being transitioned to a regular diet.     Objective: Lying in bed, alert, in no apparent distress. Abdomen with staples down midline, drain to R side.  Temperature: 36.9 C (98.4 F)  Heart Rate: 93  BP (Non-Invasive): 92/58 mmHg  Respiratory Rate: 17  SpO2-1: 98 %       Current analgesic regimen:  Roxicodone 15 mg Q4H PRN   IV Dilaudid 0.4mg  IV PRN 4 times daily, when getting out of bed        Assessment/Recommendations:  - Cancer related/post-op pain    1. Agree with starting OxyContin 20 mg BID   - Pt received ~70 mg PO Morphine equivalents over previous 24H, this is ~30% replaced in long-acting agent for continuous pain.    - Pt was previously on morphine sulphate-MS-Contin prior to surgery, will consider opioid rotation back to Morphine if she does not respond well to Oxycodone.     - Care management is looking into cost of Oxycodone regimen at this time.    - IF OxyContin/oxycodone IR is too costly, recommend transitioning to Morphine regimen:    - morphine sulphate-MS-Contin 30 mg BID    - MSIR 15 mg Q3-4H PRN    2. Consider increasing Roxicodone liquid from 15mg  to 20 mg Q3H PRN   - Pt states that she currently does not receive relief from 15mg  Roxicodone    - ~30% increase from current dose. Will continue to adjust if needed and will offer more frequently.   - Expect less need for PRN as long-acting agent takes effect.    - May also switch to tablets: 20 mg (4, 5 mg  capsules) Q3-4H PRN (left liquid to decrease amt of pills she has to take)    3. Consider scheduling Tylenol 650mg  Q6H again.   - Multiomodal analgesia, may limit amt of opioids needed.   - Has previously been effective in regimen.     4. Continue IV Dilaudid 0.4 mg IV to be used ~30 minutes prior to activity/getting out of bed.    - Will discuss with nursing to premedicate with oral PRN and use IV after activity if needed.       Discussed recommendations with Dr. Lance Bosch.  Suggest providing patient with prescription at discharge until follow-up appt with surg onc/oncology.   May then at that time taper down or completely off opioids if appropriate.      Thank you for re-consulting.  Please call [phone] with any questions or needs.     Lanetta Inch, [facility] 07/28/2012, 10:46 AM     I reviewed the NP's note.  I agree with the findings and plan of care as documented in the NP's note.  Any exceptions/additions are edited/noted.    Lauretta Chester, MD 07/28/2012

## 2012-07-29 LAB — PERFORM POC WHOLE BLOOD GLUCOSE
GLUCOSE, POINT OF CARE: 101 mg/dL (ref 70–105)
GLUCOSE, POINT OF CARE: 60 mg/dL — ABNORMAL LOW (ref 70–105)

## 2012-07-29 MED ORDER — DEXTROSE 50 % IN WATER (D50W) INTRAVENOUS SYRINGE
12.5000 g | INJECTION | INTRAVENOUS | Status: AC
Start: 2012-07-29 — End: 2012-07-29
  Administered 2012-07-29: 25 mL via INTRAVENOUS
  Filled 2012-07-29: qty 50

## 2012-07-29 MED ORDER — METOCLOPRAMIDE 5 MG/ML INJECTION SOLUTION
10.0000 mg | Freq: Four times a day (QID) | INTRAMUSCULAR | Status: DC
Start: 2012-07-29 — End: 2012-07-31
  Administered 2012-07-29 – 2012-07-31 (×8): 10 mg via INTRAVENOUS
  Filled 2012-07-29 (×8): qty 2

## 2012-07-29 MED ADMIN — ondansetron HCl (PF) 4 mg/2 mL injection solution: 4 mg | INTRAVENOUS | NDC 00409475503

## 2012-07-29 MED ADMIN — HYDROmorphone (PF) 1 mg/mL injection solution: 0.4 mg | INTRAVENOUS | NDC 00409255201

## 2012-07-29 MED ADMIN — sodium chloride 0.9 % (flush) injection syringe: 0 mL

## 2012-07-29 MED ADMIN — cyclobenzaprine 5 mg tablet: 15 mg | ORAL | NDC 00591325601

## 2012-07-29 MED ADMIN — oxyCODONE 20 mg/mL oral concentrate: 20 mg | ORAL | NDC 09991081205

## 2012-07-29 MED ADMIN — ZZ IMS TEMPLATE: 15 mg | ORAL | NDC 00591325601

## 2012-07-29 MED FILL — HYDROmorphone 1 mg/mL injection syringe: 0.4000 mg | INTRAMUSCULAR | Qty: 1 | Status: AC

## 2012-07-29 NOTE — Nurses Notes (Signed)
Pt's SR Oxycodone due at 2300. Pt states that she does not want to take that medication at this time-she only wants to take the roxicodone and flexeril. Education provided to pt regarding better pain control from SR oxycodone.

## 2012-07-29 NOTE — Nurses Notes (Signed)
 Pt states that she feels much better after zofran /dilaudid  given.

## 2012-07-29 NOTE — Nurses Notes (Signed)
Pt nauseated. Medicated with 4 mg PRN Zofran. Will continue to monitor.

## 2012-07-29 NOTE — Consults (Addendum)
 Orthopaedic Hand Progress Note    07/29/2012  L De Quervain's    Subjective: Pt was nauseated during the night and is c/o generalized aches. States L wrist is bothering her but admits to not wearing the brace as instructed.    Physical Exam:  LUE: Intact thumb flex/ext. Pain on the 1st extensor compartment with palpation    Assessment/ Plan:   - Re-emphasized need to wear splint for majority of day to allow for decrease in inflammation  - If d/c'd, f/u with Dr Corliss on outpt basis in 2 wks    Ozell Wendall [place], MD 07/29/2012, 6:46 AM  Resident pager [phone]  [facility] Dept of Orthopaedics

## 2012-07-29 NOTE — Progress Notes (Addendum)
[facility]                                                  SURG-ONCOLOGY PROGRESS NOTE    Michele Mason, Michele Mason, 37 y.o. female  Date of Admission:  07/09/2012  Date of Birth:  [birth year]  Date of Service:  07/29/2012    Post Op Day: 5 Days Post-Op S/P Procedure(s) (LRB):  INSERTION PORT SUBCUTANEOUS (Right)    Subjective: Patient very anxious this morning.  She vomited x 2 after eating.      Vital Signs:  Temp (24hrs) Max:37.5 C (99.5 F)      Systolic (24hrs), Avg:113 mmHg, Min:100 mmHg, Max:131 mmHg    Diastolic (24hrs), Avg:74 mmHg, Min:55 mmHg, Max:88 mmHg    Temp  Avg: 37.1 C (98.8 F)  Min: 36.8 C (98.2 F)  Max: 37.5 C (99.5 F)  Pulse  Avg: 97  Min: 93  Max: 101  Resp  Avg: 16.8  Min: 16  Max: 18  SpO2  Avg: 95.7 %  Min: 93 %  Max: 98 %  Pain Score (Numeric, Faces): 9    Current Medications:    Current Facility-Administered Medications:  acetaminophen (TYLENOL) tablet 650 mg Oral Q6H PRN   atenolol (TENORMIN) tablet 12.5 mg Oral Daily   benzocaine 7.5% oral gel  Mucous Membrane 4x/day PRN   cyclobenzaprine (FLEXERIL) tablet 15 mg 15 mg Oral 3x/day PRN   diphenhydrAMINE (BENADRYL) 50 mg/mL injection 25 mg Intravenous Q6H PRN   docusate sodium (COLACE) capsule 100 mg Oral 2x/day   enoxaparin (LOVENOX) 40 mg/0.4 mL SubQ injection 40 mg Subcutaneous Q24H   esomeprazole (NEXIUM) capsule 40 mg Oral Daily before Breakfast   HYDROmorphone (DILAUDID) 1 mg/mL injection 0.4 mg Intravenous Q3H PRN   LR premix infusion  Intravenous Continuous   magnesium hydroxide (MILK OF MAGNESIA) 400mg  per 5mL oral liquid 30 mL Oral NIGHTLY   metoclopramide (REGLAN) 5 mg/mL injection 10 mg Intravenous Q6HRS   NS flush syringe 2 mL Intracatheter Q8HRS   And      NS flush syringe 2-6 mL Intracatheter Q1 MIN PRN   ondansetron (ZOFRAN) 2 mg/mL injection 4 mg Intravenous Q6H PRN   oxyCODONE (OXYCONTIN) 12 hr extended release tablet 20 mg Oral Q12H    oxyCODONE concentrate (ROXICODONE INTENSOL) 20mg  per mL oral liquid 20 mg Oral Q3H PRN   sennosides-docusate sodium (SENOKOT-S) 8.6-50mg  per tablet 1 Tab Oral 2x/day       Today's Physical Exam:  General: anxious this morning  Lungs: clear to auscultation bilaterally.   Cardiovascular: regular rate and rhythm.   Abdomen: non-distended, soft, appropriately tender to palpation, incision c/d/i, staples in place.  Extremities: No cyanosis or edema   Neurologic: Alert and oriented x3    I/O:  I/O last 24 hours:      Intake/Output Summary (Last 24 hours) at 07/29/12 2133  Last data filed at 07/29/12 2100   Gross per 24 hour   Intake   1880 ml   Output      0 ml   Net   1880 ml     I/O current shift:  02/15 1600 - 02/15 2359  In: 1230 [P.O.:720; I.V.:510]  Out: -     Prophylaxis:  Date Started Date Completed   DVT/PE  Lovenox and SCDs/ Venodynes     GI Colace, Senokot,  Milk of Mag         Nutrition/Residuals:  Full Liquid    Labs   Reviewed:   Recent Labs      07/27/12   0239  07/28/12   0027   SODIUM  135*  135*   POTASSIUM  4.7  4.6   CHLORIDE  101  100   BUN  15  16   CREATININE  0.62  0.71   ANIONGAP  5  6   CALCIUM  9.2  9.0   WBC  8.5  8.3   HGB  8.9*  8.6*   HCT  27.3*  25.9*   PLTCNT  174  165       Recent Labs      07/27/12   0239   ALBUMIN  2.6*   PREALBUMIN  21.1     Assessment/ Plan:   Active Hospital Problems   (*Primary Problem)    Diagnosis   . *Gastric cancer   . Pain at surgical incision   . Hypokalemia   . Tachycardia   . Hypophosphatasia   . Anemia, chronic disease   . Acute blood loss anemia   . Leukocytosis   . De Quervain's tenosynovitis, left   . Crohn disease     Pt was treated for Crohn's for 10 years, has not had any relapses and no steroids for more than a decade.     . Personal history of PE (pulmonary embolism)   . Anxiety   . Moderate protein-calorie malnutrition   . Chronic pain disorder   . Sarcoidosis      37 y.o. F s/p distal gastrectomy with Roux-en-Y Gastrojejunostomy and Infusaport placement in right chest.  Postop pain- Continue Flexeril, Roxicodone, oxy ER  Postop ileus with n/v- decrease to liquids, IVF  - start reglan 10 q 6 scheduled  Continue Zofran 4 mg and Phenergan 25 mg for nausea   Labs tomorrow      Late entry for 0800  [name], MD 07/29/2012, 9:33 PM    This is a late entry for 07/29/2012.  I saw and examined this patient 07/29/2012.  Please see the resident's note, which I have carefully reviewed, for full details. I agree with the findings and plan of care as documented in the resident's note.  Any additions/exceptions are edited/noted.    Looks ok, although her anxiety has returned.  She had some nausea and emesis with post-gastrectomy diet.  Her major complaints today are depression and anxiety related.  On exam, HD normal, abdomen soft, nondistended, incision intact with staples, no erythema, no evidence for infection.  I am unsure if her nausea is anatomical or secondary to her severe anxiety (when she gets anxious and lonely, she has severe almost panic attacks that set of pain, nausea, etc).  Plan for full liquid diet, OOB, ambulate, initiate reglan for gastric emptying (pt states she took this for years secondary to her sarcoid).    I had a 30 minute talk with Michele Mason on the evening of 07/29/2012 (second visit for the day) regarding her condition, her anxiety, her depression, and how the culmination of these symptoms were affecting her in the hospital.  We discussed post-gastrectomy eating habits at length (small, frequent meals, eating only sitting up, not eating right before sleeping, etc).  She was in slightly better spirits after our discussion.  I reassured her that she would not be discharged until she could keep herself hydrated and nutritionally replete.  This seemed  to ease some of her anxiety.     I am debating a psychiatry consultation for her continued anxiety/depression.  She has adamently refused multiple times in the past.  I will continue to encourage this as I believe this to be the primary impediment to her recovery at this time.    [name], MD 07/30/2012 12:55 PM  Assistant Professor, Division of Surgical Oncology  [facility] Department of Surgery

## 2012-07-29 NOTE — Care Plan (Signed)
Problem: General Plan of Care(Adult,OB)  Goal: Plan of Care Review(Adult,OB)  The patient and/or their representative will communicate an understanding of their plan of care   Outcome: Ongoing (see interventions/notes)  Plan of care reviewed with pt. Pt A&O. Midline incision intact with staples. Old JP site covered with gauze and tape. MIVF running. Pt has had complaints of pain and nausea throughout the day. PRN and scheduled nausea and pain medication given. Needs encouragement to get OOB. D/C planning in progress. Will continue to monitor.

## 2012-07-29 NOTE — Care Plan (Signed)
 Problem: General Plan of Care(Adult,OB)  Goal: Plan of Care Review(Adult,OB)  The patient and/or their representative will communicate an understanding of their plan of care   Outcome: Ongoing (see interventions/notes)  Plan of care reviewed with patient. Patient states pain is not controlled, but had refused to take scheduled Oxy SR. Patient has had several episodes of nausea-zofran  administeres.  Patient ambulating in room with minimal assist. D/C planning continues throughout hospitalization.

## 2012-07-29 NOTE — Nurses Notes (Signed)
Pt refusing morning assessment by nursing staff stating she does not "feel well" and does not want to be bothered at this time. Will continue to monitor.

## 2012-07-29 NOTE — Nurses Notes (Signed)
FS re-check 155. Will continue to monitor.

## 2012-07-29 NOTE — Nurses Notes (Signed)
Pt had FS checked prior to dinner tray being brought up. FS was 64. Pt given orange juice. Pt had orange juice along with soup on tray. Re-check FS 60. Dr. Ezequiel Essex notified that staff would continue to monitor FS. Pt currently unable to consume liquids due to nausea. Secondary order placed for D50W since pt is unable to eat at this time. Will re-check FS.

## 2012-07-29 NOTE — Nurses Notes (Signed)
Pt nauseous and in pain. Zofran and IV pain medication not due at this time. Dr. Georgianne Fick notified and placed orders for one time doses of zofran and dilaudid.

## 2012-07-30 LAB — BASIC METABOLIC PANEL
ANION GAP: 8 mmol/L (ref 5–16)
BUN/CREAT RATIO: 16 (ref 6–22)
BUN: 11 mg/dL (ref 6–20)
CALCIUM: 9.1 mg/dL (ref 8.5–10.4)
CARBON DIOXIDE: 27 mmol/L (ref 22–32)
CHLORIDE: 99 mmol/L (ref 96–111)
CREATININE: 0.67 mg/dL (ref 0.49–1.10)
ESTIMATED GLOMERULAR FILTRATION RATE: >59 ml/min/1.73m2 (ref >59)
GLUCOSE,NONFAST: 79 mg/dL (ref 65–139)
POTASSIUM: 4.5 mmol/L (ref 3.5–5.1)
SODIUM: 134 mmol/L — ABNORMAL LOW (ref 136–145)

## 2012-07-30 LAB — CBC/DIFF
BASOPHILS: 0 %
BASOS ABS: 0.024 10*3/uL (ref 0.0–0.2)
EOS ABS: 0.308 10*3/uL (ref 0.0–0.5)
EOSINOPHIL: 4 %
HCT: 27.1 % — ABNORMAL LOW (ref 33.5–45.2)
HGB: 9 g/dL — ABNORMAL LOW (ref 11.2–15.2)
LYMPHOCYTES: 18 %
LYMPHS ABS: 1.306 10*3/uL (ref 1.0–4.8)
MCH: 28.8 pg (ref 27.4–33.0)
MCHC: 33.2 g/dL (ref 32.5–35.8)
MCV: 86.6 fL (ref 78–100)
MONOCYTES: 12 %
MONOS ABS: 0.914 10*3/uL (ref 0.3–1.0)
MPV: 10 fL (ref 7.5–11.5)
PLATELET COUNT: 159 THOU/uL (ref 140–450)
PMN ABS: 4.906 THOU/uL (ref 1.5–7.7)
PMN'S: 66 %
RBC: 3.13 MIL/uL — ABNORMAL LOW (ref 3.63–4.92)
RDW: 15.2 % — ABNORMAL HIGH (ref 12.0–15.0)
WBC: 7.5 THOU/uL (ref 3.5–11.0)

## 2012-07-30 LAB — PERFORM POC WHOLE BLOOD GLUCOSE
GLUCOSE, POINT OF CARE: 114 mg/dL — ABNORMAL HIGH (ref 70–105)
GLUCOSE, POINT OF CARE: 102 mg/dL (ref 70–105)
GLUCOSE, POINT OF CARE: 155 mg/dL — ABNORMAL HIGH (ref 70–105)
GLUCOSE, POINT OF CARE: 102 mg/dL (ref 70–105)
GLUCOSE, POINT OF CARE: 94 mg/dL (ref 70–105)
GLUCOSE, POINT OF CARE: 85 mg/dL (ref 70–105)
GLUCOSE, POINT OF CARE: 89 mg/dL (ref 70–105)

## 2012-07-30 MED ADMIN — ZZ IMS TEMPLATE: 15 mg | ORAL | NDC 00378075101

## 2012-07-30 MED ADMIN — oxyCODONE 20 mg/mL oral concentrate: 20 mg | ORAL | NDC 09991081205

## 2012-07-30 MED ADMIN — ondansetron HCl (PF) 4 mg/2 mL injection solution: 4 mg | INTRAVENOUS | NDC 00641607801

## 2012-07-30 MED ADMIN — HYDROmorphone (PF) 1 mg/mL injection solution: 0.4 mg | INTRAVENOUS | NDC 00409255201

## 2012-07-30 MED ADMIN — ZZ IMS TEMPLATE: 15 mg | ORAL | NDC 00591325601

## 2012-07-30 MED ADMIN — sodium chloride 0.9 % (flush) injection syringe: 0 mL

## 2012-07-30 NOTE — Care Plan (Signed)
Problem: General Plan of Care(Adult,OB)  Goal: Plan of Care Review(Adult,OB)  The patient and/or their representative will communicate an understanding of their plan of care   Outcome: Ongoing (see interventions/notes)  POC reviewed. Pain controlled with prn meds. Tolerating diet. Intermittent nausea controlled with prn and scheduled meds. Ambulating independently. Voiding without difficulty. Abdominal incision and abdomen assessed for changes, unchanged this shift. D/C planning in progress.

## 2012-07-30 NOTE — Nurses Notes (Signed)
Pt c/o 10/10 pain, medicated with prn roxicodone per order. Pt states that this med does not work for pain and wants MD called for different med. Paged Dr. Georgianne Fick. States that pt has pain team consult and that currently pt is being medicated per their recommendations, no new orders received. Explained to pt, pt unhappy and wants to know why nurses always call some doctor that she has never seen and does not know her and why RN's aren't calling directly to attending MD. Explained chain of command to pt, pt upset and states she does not want to talk about it anymore she just wants left alone. Will continue to monitor.

## 2012-07-30 NOTE — Nurses Notes (Signed)
 Pt ambulated 1 lap around the unit without assistance. Pt tolerated well. Will continue to monitor.

## 2012-07-30 NOTE — Progress Notes (Addendum)
[facility]                                                  SURG-ONCOLOGY PROGRESS NOTE    Demi, Trieu, 37 y.o. female  Date of Admission:  07/09/2012  Date of Birth:  [birth year]  Date of Service:  07/30/2012    Post Op Day: 6 Days Post-Op S/P Procedure(s) (LRB):  INSERTION PORT SUBCUTANEOUS (Right)    Subjective: Patient states that she is doing "well" this AM. No acute events overnight. Multiple bowel movements yesterday. 1 episode of emesis. Patient states that she thinks she's trying to eat too fast, wants to take it slower with liquids for today.     Vital Signs:  Temp (24hrs) Max:37.1 C (98.7 F)      Systolic (24hrs), Avg:98 mmHg, Min:94 mmHg, Max:102 mmHg    Diastolic (24hrs), Avg:61 mmHg, Min:55 mmHg, Max:69 mmHg    Temp  Avg: 36.8 C (98.3 F)  Min: 36.8 C (98.2 F)  Max: 37.1 C (98.7 F)  Pulse  Avg: 99.3  Min: 85  Max: 121  Resp  Avg: 17.3  Min: 16  Max: 18  SpO2  Avg: 98.3 %  Min: 97 %  Max: 100 %  Pain Score (Numeric, Faces): 7    Current Medications:    Current Facility-Administered Medications:  acetaminophen (TYLENOL) tablet 650 mg Oral Q6H PRN   atenolol (TENORMIN) tablet 12.5 mg Oral Daily   benzocaine 7.5% oral gel  Mucous Membrane 4x/day PRN   cyclobenzaprine (FLEXERIL) tablet 15 mg 15 mg Oral 3x/day PRN   diphenhydrAMINE (BENADRYL) 50 mg/mL injection 25 mg Intravenous Q6H PRN   docusate sodium (COLACE) capsule 100 mg Oral 2x/day   enoxaparin (LOVENOX) 40 mg/0.4 mL SubQ injection 40 mg Subcutaneous Q24H   esomeprazole (NEXIUM) capsule 40 mg Oral Daily before Breakfast   HYDROmorphone (DILAUDID) 1 mg/mL injection 0.4 mg Intravenous Q3H PRN   LR premix infusion  Intravenous Continuous   magnesium hydroxide (MILK OF MAGNESIA) 400mg  per 5mL oral liquid 30 mL Oral NIGHTLY   metoclopramide (REGLAN) 5 mg/mL injection 10 mg Intravenous Q6HRS   NS flush syringe 2 mL Intracatheter Q8HRS   And      NS flush syringe 2-6 mL Intracatheter Q1 MIN PRN    ondansetron (ZOFRAN) 2 mg/mL injection 4 mg Intravenous Q6H PRN   oxyCODONE (OXYCONTIN) 12 hr extended release tablet 20 mg Oral Q12H   oxyCODONE concentrate (ROXICODONE INTENSOL) 20mg  per mL oral liquid 20 mg Oral Q3H PRN   sennosides-docusate sodium (SENOKOT-S) 8.6-50mg  per tablet 1 Tab Oral 2x/day       Today's Physical Exam:  General: no distress and resting comfortably this morning   Lungs: clear to auscultation bilaterally.   Cardiovascular: regular rate and rhythm.   Abdomen: non-distended, soft, appropriately tender to palpation, incision c/d/i, staples in place.   Extremities: No cyanosis or edema   Neurologic: Alert and oriented x 3    I/O:  I/O last 24 hours:    Intake/Output Summary (Last 24 hours) at 07/30/12 1445  Last data filed at 07/30/12 1300   Gross per 24 hour   Intake   3960 ml   Output      0 ml   Net   3960 ml     I/O current shift:  02/16 0800 - 02/16 1559  In: 600 [I.V.:600]  Out: -     Prophylaxis:  Date Started Date Completed   DVT/PE  Lovenox and SCDs/ Venodynes     GI Colace, Senokot, Milk of Mag         Nutrition/Residuals:  Regular    Labs   Reviewed:   Recent Labs      07/28/12   0027  07/30/12   0136   SODIUM  135*  134*   POTASSIUM  4.6  4.5   CHLORIDE  100  99   BUN  16  11   CREATININE  0.71  0.67   ANIONGAP  6  8   CALCIUM  9.0  9.1   WBC  8.3  7.5   HGB  8.6*  9.0*   HCT  25.9*  27.1*   PLTCNT  165  159       Assessment/ Plan:   Active Hospital Problems   (*Primary Problem)    Diagnosis   . *Gastric cancer   . Pain at surgical incision   . Hypokalemia   . Tachycardia   . Hypophosphatasia   . Anemia, chronic disease   . Acute blood loss anemia   . Leukocytosis   . De Quervain's tenosynovitis, left   . Crohn disease     Pt was treated for Crohn's for 10 years, has not had any relapses and no steroids for more than a decade.     . Personal history of PE (pulmonary embolism)   . Anxiety   . Moderate protein-calorie malnutrition   . Chronic pain disorder   . Sarcoidosis      37 y.o. F s/p distal gastrectomy with Roux-en-Y Gastrojejunostomy and Infusaport placement in right chest.   Continue Flexeril, Roxicodone, Oxy ER for pain management  Continue soft diet as tolerated   Continue Zofran 4 mg and Phenergan 25 mg for nausea control  Ortho to inject thumb/wrist tomorrow  Activity: Continue up and OOB at least 4 x day  Dispo: Likely discharge home tomorrow    Nsisong Sheran Fava, MD 07/30/2012, 2:45 PM    I saw and examined this patient with the resident above.  Please see the resident's note, which I have carefully reviewed, for full details. I agree with the findings and plan of care as documented in the resident's note.  Any additions/exceptions are edited/noted.    She is in much better spirits today.  No further emesis overnight and she tolerated iced cream, pudding, juices.  She has been OOB, has not ambulated as much as I would like or as much as I have advised on multiple occasions.  Pain is only marginally controlled, will discuss with pain team.  On exam, HD normal, abdomen benign, incision intact, no erythema or evidence for infection.  No drainage.  WBC 7.5, electrolytes normal.  Plan for soft diet today.  OOB, I have demanded her to ambulate more frequently.  If further nausea, UGI with gastric emptying study Monday.  Continue reglan.    [name], MD 07/30/2012 4:12 PM  Assistant Professor, Division of Surgical Oncology  [place] Department of Surgery

## 2012-07-30 NOTE — Nurses Notes (Signed)
HS FS result 89, per SSIP no coverage needed, will continue to monitor.

## 2012-07-30 NOTE — Nurses Notes (Signed)
 Pt ambulated in hallway with standby assistance. Pt tolerated well. Will continue to monitor.

## 2012-07-30 NOTE — Care Plan (Signed)
Problem: General Plan of Care(Adult,OB)  Goal: Plan of Care Review(Adult,OB)  The patient and/or their representative will communicate an understanding of their plan of care   Outcome: Ongoing (see interventions/notes)  Plan of care reviewed with pt. Pt A&O. Midline incision intact with staples. Pain is controlled with scheduled and PRN oxycodone. Scheduled Reglan helping to control nausea. Pt ambulates in hallways with no assistance. MIVF running. D/C planning in progress. Will continue to monitor.

## 2012-07-31 ENCOUNTER — Inpatient Hospital Stay (HOSPITAL_COMMUNITY): Payer: MEDICAID

## 2012-07-31 LAB — PERFORM POC WHOLE BLOOD GLUCOSE
GLUCOSE, POINT OF CARE: 88 mg/dL (ref 70–105)
GLUCOSE, POINT OF CARE: 89 mg/dL (ref 70–105)
GLUCOSE, POINT OF CARE: 91 mg/dL (ref 70–105)
GLUCOSE, POINT OF CARE: 70 mg/dL (ref 70–105)

## 2012-07-31 MED ORDER — DIATRIZOATE MEGLUMINE-DIATRIZOATE SODIUM 66 %-10 % ORAL SOLUTION
ORAL | Status: AC
Start: 2012-07-31 — End: 2012-07-31
  Filled 2012-07-31: qty 240

## 2012-07-31 MED ORDER — OXYCODONE 5 MG CAPSULE
30.0000 mg | ORAL_CAPSULE | ORAL | Status: DC | PRN
Start: 2012-07-31 — End: 2012-08-01
  Administered 2012-07-31 – 2012-08-01 (×4): 30 mg via ORAL
  Administered 2012-08-01: 0 mg via ORAL
  Administered 2012-08-01: 30 mg via ORAL
  Filled 2012-07-31 (×7): qty 6

## 2012-07-31 MED ORDER — LACTULOSE 20 GRAM/30 ML ORAL SOLUTION
30.0000 mL | Freq: Once | ORAL | Status: AC
Start: 2012-07-31 — End: 2012-07-31
  Administered 2012-07-31: 30 mL via ORAL
  Filled 2012-07-31: qty 30

## 2012-07-31 MED ORDER — DIPHENHYDRAMINE 25 MG CAPSULE
50.0000 mg | ORAL_CAPSULE | Freq: Four times a day (QID) | ORAL | Status: DC | PRN
Start: 2012-07-31 — End: 2012-08-01

## 2012-07-31 MED ORDER — POLYETHYLENE GLYCOL 3350 17 GRAM ORAL POWDER PACKET
34.0000 g | Freq: Once | ORAL | Status: AC
Start: 2012-07-31 — End: 2012-07-31
  Administered 2012-07-31: 34 g via ORAL
  Filled 2012-07-31: qty 2

## 2012-07-31 MED ORDER — MAGNESIUM HYDROXIDE 400 MG/5 ML ORAL SUSPENSION
30.00 mL | Freq: Every evening | ORAL | Status: DC
Start: 2012-07-31 — End: 2012-07-31

## 2012-07-31 MED ORDER — METOCLOPRAMIDE 10 MG TABLET
10.0000 mg | ORAL_TABLET | Freq: Four times a day (QID) | ORAL | Status: DC
Start: 2012-07-31 — End: 2012-08-01
  Administered 2012-07-31 (×3): 10 mg via ORAL
  Administered 2012-08-01: 0 mg via ORAL
  Administered 2012-08-01: 10 mg via ORAL
  Filled 2012-07-31 (×7): qty 1

## 2012-07-31 MED ORDER — ONDANSETRON HCL (PF) 4 MG/2 ML INJECTION SOLUTION
4.00 mg | INTRAMUSCULAR | Status: AC
Start: 2012-07-31 — End: 2012-07-31
  Administered 2012-07-31: 4 mg via INTRAVENOUS
  Filled 2012-07-31: qty 2

## 2012-07-31 MED ORDER — BARIUM SULFATE 60 % (W/V) ORAL SUSPENSION
ORAL | Status: AC
Start: 2012-07-31 — End: 2012-07-31
  Filled 2012-07-31: qty 355

## 2012-07-31 MED ADMIN — diatrizoate meglumine and diat.sodium 66 %-10 % oral solution: 30 mL | ORAL | NDC 00270044535

## 2012-07-31 MED ADMIN — HYDROmorphone (PF) 1 mg/mL injection solution: 0.4 mg | INTRAVENOUS | NDC 00409255201

## 2012-07-31 MED ADMIN — ZZ IMS TEMPLATE: 15 mg | ORAL | NDC 00378075101

## 2012-07-31 MED ADMIN — sodium chloride 0.9 % (flush) injection syringe: 2 mL | NDC 08290306547

## 2012-07-31 MED ADMIN — barium sulfate 60 % (w/v) oral suspension: 100 mL | ORAL | NDC 32909018602

## 2012-07-31 MED ADMIN — oxyCODONE 20 mg/mL oral concentrate: 20 mg | ORAL | NDC 09991081205

## 2012-07-31 MED ADMIN — sodium chloride 0.9 % (flush) injection syringe: 0 mL

## 2012-07-31 MED ADMIN — ondansetron HCl (PF) 4 mg/2 mL injection solution: 4 mg | INTRAVENOUS | NDC 00409475503

## 2012-07-31 MED ADMIN — ZZ IMS TEMPLATE: 15 mg | ORAL | NDC 00591325601

## 2012-07-31 NOTE — Nurses Notes (Signed)
Paged Dr. Georgianne Fick, pt without any lab orders this morning, asked if any labs were wanted this morning, states no, will continue to monitor.

## 2012-07-31 NOTE — Consults (Addendum)
[facility]  Pain Resource Team  Follow Up    Michele Mason  [id number]  1975/10/17    Date of Service:  07/31/2012  Requesting MD:  Surg Onc  Chief Complaint/Reason for consult:  Pain control      Subjective: Pt complained of uncontrolled pain over the weekend along with nausea and vomiting. She is attempting to increase PO intake and is unsure of amt she can tolerate without increasing pain and reflux. She continues to take Flexeril at the same time as Roxicodone because she doesn't think the Roxicodone works well enough. With both, her pain can be reduced to ~1/10. With Roxicodone alone, she reports her pain decreases ~2 points.     Current pain intensity: 3 / 10    Objective: Lying in bed, alert, calm, in NAD.  Temperature: 36.9 C (98.4 F)  Heart Rate: 87  BP (Non-Invasive): 107/68 mmHg  Respiratory Rate: 18  SpO2-1: 97 %      Current analgesic regimen:    OxyContin 20 mg BID  Roxicodone liquid 20 mg Q3H PRN  Tylenol 650mg  Q6H PRN    Previous 24H opioid intake in PO Morphine equivalents:  1/14: 79 mg  1/15: 112 mg  1/16: 124 mg      Assessment/Recommendations:    1. Please change Roxicodone liquid 20 mg to Oxycodone capsules, 30 mg Q3H PRN   - 50% increase and changing to pills.   - If pills not tolerated, will switch back to liquid.   - Remains a little >20% of total 24H intake for PRN dosing    2. Encouraged pt to take Flexeril and Oxycodone separately with increase in dose and to assess efficacy of each alone.    - Will continue to also offer Dilaudid until effective regimen established    Will re-assess after changes are made and adjust as needed.    Lanetta Inch, [facility] 07/31/2012, 9:24 AM    I reviewed the NP's note.  I agree with the findings and plan of care as documented in the NP's note.  Any exceptions/additions are edited/noted.    Lauretta Chester, MD 08/01/2012

## 2012-07-31 NOTE — Care Plan (Signed)
Problem: General Plan of Care(Adult,OB)  Goal: Plan of Care Review(Adult,OB)  The patient and/or their representative will communicate an understanding of their plan of care   Outcome: Ongoing (see interventions/notes)  POC reviewed. Pain controlled with prn meds. Tolerating diet this evening. Intermittent nausea controlled with prn and scheduled meds. Ambulating independently. Voiding without difficulty. Abdominal incision and abdomen assessed for changes, unchanged this shift. D/C planning in progress, possibly for today to home.

## 2012-07-31 NOTE — Nurses Notes (Signed)
patient up in bed prior to 10 am eating breakfast and denies pain, then with in minutes co 10/10 after eating.  Medicated per [facility] with scheduled and PRN medications.  Continues to co at this time, RN notified and Dilaudid given to patient.  Sharp pain continues.  fluro notified patient had breakfast and am medications.  Pollyann Glen, LPN 1/61/0960, 45:40 AM

## 2012-08-01 LAB — PERFORM POC WHOLE BLOOD GLUCOSE
GLUCOSE, POINT OF CARE: 109 mg/dL — ABNORMAL HIGH (ref 70–105)
GLUCOSE, POINT OF CARE: 81 mg/dL (ref 70–105)

## 2012-08-01 MED ORDER — MAGNESIUM HYDROXIDE 400 MG/5 ML ORAL SUSPENSION
30.00 mL | Freq: Every evening | ORAL | Status: DC
Start: 2012-08-01 — End: 2012-10-20

## 2012-08-01 MED ORDER — OXYCODONE 5 MG CAPSULE
15.0000 mg | ORAL_CAPSULE | ORAL | Status: DC | PRN
Start: 2012-08-01 — End: 2012-08-11

## 2012-08-01 MED ORDER — POLYETHYLENE GLYCOL 3350 17 GRAM ORAL POWDER PACKET
17.00 g | Freq: Every day | ORAL | Status: DC | PRN
Start: 2012-08-01 — End: 2012-11-10

## 2012-08-01 MED ORDER — ONDANSETRON HCL (PF) 4 MG/2 ML INJECTION SOLUTION
4.00 mg | INTRAMUSCULAR | Status: AC
Start: 2012-08-01 — End: 2012-08-01
  Administered 2012-08-01: 4 mg via INTRAVENOUS

## 2012-08-01 MED ORDER — LIDOCAINE HCL 10 MG/ML (1 %) INJECTION SOLUTION
6.00 mg | Freq: Once | INTRAMUSCULAR | Status: DC | PRN
Start: 2012-08-01 — End: 2012-08-01

## 2012-08-01 MED ORDER — OXYCODONE ER 20 MG TABLET,EXTENDED RELEASE,12 HR
20.00 mg | ORAL_TABLET | Freq: Two times a day (BID) | ORAL | Status: DC
Start: 2012-08-01 — End: 2014-01-11

## 2012-08-01 MED ORDER — CYCLOBENZAPRINE 7.5 MG TABLET
15.00 mg | ORAL_TABLET | Freq: Three times a day (TID) | ORAL | Status: DC | PRN
Start: 2012-08-01 — End: 2012-08-11

## 2012-08-01 MED ORDER — METOCLOPRAMIDE 10 MG TABLET
10.00 mg | ORAL_TABLET | Freq: Three times a day (TID) | ORAL | Status: DC
Start: 2012-08-01 — End: 2012-08-11

## 2012-08-01 MED ORDER — LIDOCAINE HCL 10 MG/ML (1 %) INJECTION SOLUTION
2.0000 mL | Freq: Once | INTRAMUSCULAR | Status: DC
Start: 2012-08-01 — End: 2012-08-01
  Filled 2012-08-01: qty 20

## 2012-08-01 MED ORDER — SENNOSIDES 8.6 MG-DOCUSATE SODIUM 50 MG TABLET
1.00 | ORAL_TABLET | Freq: Two times a day (BID) | ORAL | Status: DC
Start: 2012-08-01 — End: 2012-11-10

## 2012-08-01 MED ADMIN — cyclobenzaprine 5 mg tablet: 15 mg | ORAL | NDC 00378075101

## 2012-08-01 MED ADMIN — HYDROmorphone (PF) 1 mg/mL injection solution: 0.4 mg | INTRAVENOUS | NDC 00409255201

## 2012-08-01 MED ADMIN — ondansetron HCl (PF) 4 mg/2 mL injection solution: 4 mg | INTRAVENOUS | NDC 00409475503

## 2012-08-01 MED ADMIN — ZZ IMS TEMPLATE: 15 mg | ORAL | NDC 00378075101

## 2012-08-01 MED ADMIN — sodium chloride 0.9 % (flush) injection syringe: 0 mL

## 2012-08-01 MED ADMIN — HYDROmorphone (PF) 1 mg/mL injection solution: 0.2 mg | INTRAVENOUS | NDC 00409255201

## 2012-08-01 NOTE — Progress Notes (Addendum)
[facility]                                                  SURG-ONCOLOGY PROGRESS NOTE    Michele Mason, Michele Mason, 37 y.o. female  Date of Admission:  07/09/2012  Date of Birth:  Aug 01, 1975  Date of Service:  08/01/2012    Post Op Day: 8 Days Post-Op S/P Procedure(s) (LRB):  INSERTION PORT SUBCUTANEOUS (Right)    Subjective:  UGI follow through study showed passage of contrast through the small bowel.  Patient has several large BMs overnight and this morning.  She feels well and has called her father to come pick her up.    Vital Signs:  Temp (24hrs) Max:37 C (98.6 F)      Systolic (24hrs), Avg:114 mmHg, Min:101 mmHg, Max:131 mmHg    Diastolic (24hrs), Avg:74 mmHg, Min:62 mmHg, Max:95 mmHg    Temp  Avg: 36.8 C (98.3 F)  Min: 36.6 C (97.9 F)  Max: 37 C (98.6 F)  Pulse  Avg: 113.2  Min: 106  Max: 130  Resp  Avg: 18.4  Min: 18  Max: 20  SpO2  Avg: 99 %  Min: 98 %  Max: 100 %  Pain Score (Numeric, Faces): 5    Current Medications:    Current Facility-Administered Medications:  acetaminophen (TYLENOL) tablet 650 mg Oral Q6H PRN   atenolol (TENORMIN) tablet 12.5 mg Oral Daily   benzocaine 7.5% oral gel  Mucous Membrane 4x/day PRN   cyclobenzaprine (FLEXERIL) tablet 15 mg 15 mg Oral 3x/day PRN   diphenhydrAMINE (BENADRYL) capsule 50 mg Oral Q6H PRN   docusate sodium (COLACE) capsule 100 mg Oral 2x/day   enoxaparin (LOVENOX) 40 mg/0.4 mL SubQ injection 40 mg Subcutaneous Q24H   esomeprazole (NEXIUM) capsule 40 mg Oral Daily before Breakfast   HYDROmorphone (DILAUDID) 1 mg/mL injection 0.4 mg Intravenous Q3H PRN   lidocaine 1% injection 2 mL Intradermal Once   LR premix infusion  Intravenous Continuous   magnesium hydroxide (MILK OF MAGNESIA) 400mg  per 5mL oral liquid 30 mL Oral NIGHTLY   metoclopramide (REGLAN) tablet 10 mg Oral 4x/day AC   NS flush syringe 2 mL Intracatheter Q8HRS   And      NS flush syringe 2-6 mL Intracatheter Q1 MIN PRN    ondansetron (ZOFRAN) 2 mg/mL injection 4 mg Intravenous Q6H PRN   oxyCODONE (OXYCONTIN) 12 hr extended release tablet 20 mg Oral Q12H   oxyCODONE immediate release (OXY IR) capsule 30 mg Oral Q3H PRN   sennosides-docusate sodium (SENOKOT-S) 8.6-50mg  per tablet 1 Tab Oral 2x/day       Today's Physical Exam:  General: no distress, pleasant  Lungs: CTAB  Cardiovascular: RRR  Abdomen: non-distended, soft, nontender to palpation, incision intact with staples in place.   Extremities: No cyanosis or edema   Neurologic: AAO x 3    I/O:  I/O last 24 hours:      Intake/Output Summary (Last 24 hours) at 08/01/12 1304  Last data filed at 08/01/12 1300   Gross per 24 hour   Intake   2033 ml   Output      0 ml   Net   2033 ml     I/O current shift:  02/18 0800 - 02/18 1559  In: 1133 [I.V.:1133]  Out: -     Prophylaxis:  Date Started  Date Completed   DVT/PE  Lovenox and SCDs/ Venodynes     GI Colace, Nexium, Milk of Mag, Reglan, Zofran, Miralax, Senokot         Nutrition/Residuals:  Regular    Labs    Reviewed:   Recent Labs      07/30/12   0136   SODIUM  134*   POTASSIUM  4.5   CHLORIDE  99   BUN  11   CREATININE  0.67   ANIONGAP  8   CALCIUM  9.1   WBC  7.5   HGB  9.0*   HCT  27.1*   PLTCNT  159     Assessment/ Plan:   Active Hospital Problems   (*Primary Problem)    Diagnosis   . *Gastric cancer   . Pain at surgical incision   . Hypokalemia   . Tachycardia   . Hypophosphatasia   . Anemia, chronic disease   . Acute blood loss anemia   . Leukocytosis   . De Quervain's tenosynovitis, left   . Crohn disease     Pt was treated for Crohn's for 10 years, has not had any relapses and no steroids for more than a decade.     . Personal history of PE (pulmonary embolism)   . Anxiety   . Moderate protein-calorie malnutrition   . Chronic pain disorder   . Sarcoidosis     37 y.o. F s/p distal gastrectomy with Roux-en-Y Gastrojejunostomy and Infusaport placement in right chest.   Patient passed large BMs overnight with lactulose.   She feels improved and is ready to go home.  DC to home with follow up on 08/11/12 with Dr. Leota Sauers and Med Onc  Follow with orthopedics outpatient   Will DC with bowel regimen.  Will remove staples and replace with wide steristrips.    Michele Brook, MD 08/01/2012, 1:04 PM    I saw and examined this patient with the resident above.  Please see the resident's note, which I have carefully reviewed, for full details. I agree with the findings and plan of care as documented in the resident's note.  Any additions/exceptions are edited/noted.    Today is the best I have seen her look.  No pain at rest, had multiple bowel movements overnight which have eased her pain considerably.  On exam, HD normal, abdomen benign, staples removed and steri-strips placed.  Plan for discharge home.  F/U with me and medical oncology in 2 weeks.  F/U also with ortho for left wrist pain.    Michele Patterson, MD 08/01/2012 10:04 PM  Assistant Professor, Division of Surgical Oncology  [facility] Department of Surgery

## 2012-08-01 NOTE — CDI REVIEW (Signed)
 Admissions, Findings, and Consultations:  2/14 Surg Onc: s/p Distal Gastrectomy with RNY Gastrojejunostomy   2/15 Surg Onc: anxiety, Moderate protein-calorie malnutrition , sarcoidosis, Postop ileus , depression  2/16 Surg Onc: same  2/17 Surg Onc: same     Diagnostics and Results:   Procedures and Treatments:   Meds, IV's, Rx Blood:   Comments:

## 2012-08-01 NOTE — Nurses Notes (Signed)
Pt called nursing phone stating she just woke up and is having abdominal pain, requesting pain meds. Entered room and attempted to give pt prn dose of Oxy IR 30mg  po. Pt started yelling stating that her pain is so bad that she wants Dilaudid. Dilaudid order specifically states only to be given when OOB. Dr Georgianne Fick notified and reinforced that Dilaudid could only be given as written.  Re entered pts room to, once again, offer prn of Oxy IR and explain current Dilaudid order. Pt was talking over me and ignoring me when being asked specific questions regarding her pain. She then started stomping around the room yelling "is this better? Im out of bed now" Pt demanding Dilaudid, refusing po Oxy IR stating "they dont work" "I want Dilaudid." Gave Dilaudid IVP 0.4mg  IVP since pt was now out of bed, stomping around the room and yelling.  Dr Georgianne Fick notified of current situation and was OK with dose of prn Dilaudid given as long as pt was OOB, per order.

## 2012-08-01 NOTE — Care Plan (Signed)
Problem: General Plan of Care(Adult,OB)  Goal: Plan of Care Review(Adult,OB)  The patient and/or their representative will communicate an understanding of their plan of care   Outcome: Adequate for Discharge Date Met:  08/01/12  Pt to be d/c to home with dad. Pt going to have home health from [facility]. PIV removed. Pt is ambulating with steady gait. LBM was 2-18. Pt is voiding adequate amounts of urine. Staples removed by service and steri strips placed. Pt is given AVS and follow up appointments. Pt verbalizes understanding of AVS. Pt is given information about s/s of infx, wound/incision care, Flexeril, MOM, Reglan, Oxy IR, Oxycontin, Miralax, and Senokot. Pt states that she is vomiting. Pt is given Dilaudid and Zofran per Dr. Hedwig Morton before d/c.

## 2012-08-01 NOTE — Care Management Notes (Addendum)
[facility]  Care Management Note    Patient Name: Michele Mason  Date of Birth: [birth year]  Sex: female  Date/Time of Admission: 07/09/2012  1:37 PM  Room/Bed: 765/A  Payor: [place] MEDICAID  Plan: [facility] MEDICAID  Product Type: Medicaid     LOS: 23 days   PCP: No Established Pcp    Admitting Diagnosis:  gastric mass  gastric tumor  gastic cancer  Gastric cancer    Assessment:   CCC contacted [facility] to update on patient discharge plan. Spoke with Michele Mason to inform that updated referral information is be sent today and that patient will discharge today. DON is to call [facility] if needs further information or has any questions.     Discharge Plan:  Home with Home Health (code 6)  To discharge today home with [facility] services for wound and port care. Transportation available. Has family/freinds to provide support.    The patient will continue to be evaluated for developing discharge needs.     Case Manager: Harriet Masson, RN 08/01/2012, 1:18 PM  Phone: [phone]    [facility] informed patient of arrangement for the [facility] services and ensured she had the contact information for [facility]. Patient states that she is happy [facility] services are setup and appreciate the care she received at [facility] and Care management.

## 2012-08-01 NOTE — Nurses Notes (Signed)
Pt stated she was up to the bathroom several times and was straining to have a bowel movement. When I was asking her specific questions about amount, consistency, etc... She kept responding with "yeah" Tried asking her multiple times and received the same vague answer of "yeah".  BM was not saved.

## 2012-08-01 NOTE — Care Plan (Signed)
 Problem: General Plan of Care(Adult,OB)  Goal: Plan of Care Review(Adult,OB)  The patient and/or their representative will communicate an understanding of their plan of care   Outcome: Ongoing (see interventions/notes)  To discharge today home with [facility] services for wound and port care. Transportation available. Has family/freinds to provide support.    The patient will continue to be evaluated for developing discharge needs.     Case Manager: Alix Pereyra, RN 08/01/2012, 1:18 PM  Phone: [phone]

## 2012-08-01 NOTE — Nurses Notes (Signed)
Report called to [facility] to [place].

## 2012-08-01 NOTE — Nurses Notes (Signed)
Medicated with Zofran 4mg  IVP for c/o nausea

## 2012-08-01 NOTE — Nurses Notes (Signed)
FS 70. Pt denies being symptomatic. She is currently drinking apple juice and eating ice cream. Dr Georgianne Fick notified, no orders given. Will monitor.

## 2012-08-01 NOTE — Nurses Notes (Signed)
Medicated with Flexeril 15mg  po per pt request

## 2012-08-01 NOTE — Consults (Addendum)
[facility]  Pain Resource Team  Follow Up    Michele Mason  [id number]  May 21, 1976    Date of Service:  08/01/2012  Requesting MD:  Surg Onc  Chief Complaint/Reason for consult: Pain control       Subjective: Resting this morning. When asked about her night, she said that it was fine. Regarding her episode of increased pain ~0130, she said that she had gotten up to go to the restroom to have a BM twice, and was having increased pain from getting out of bed. After discussing that occurrence, her demeanor became a little bit more withdrawn and gave short answers to questions.     The increased dose of OxyIR is working well, she reports that it reduced her pain to 0/10 this morning. She said that it doesn't last long enough.She received it ~0600 this morning, and 2.5 hrs later, her pain is absent. She was reminded that it is currently available Q3H, and she should increase utilization to stay on top of breakthrough pain if needed.     She does still complain of a headache, for which she can take Tylenol and is resting in the dark.     Objective: Lying in bed under covers. In no apparent distress.  Temperature: 37 C (98.6 F)  Heart Rate: 106  BP (Non-Invasive): 101/67 mmHg  Respiratory Rate: 18  SpO2-1: 100 %      Current analgesic regimen:  OxyContin 20 mg BID  OxyIR 30 mg Q3H PRN  Dilaudid 0.4 mg IV QID when OOB  Tylenol 650mg  Q6H PRN   Adjuvants:  Flexeril 15 mg TID PRN  Benadryl    Previous 24H opioid intake in PO Morphine equivalents:  1/14: 79 mg  1/15: 112 mg  1/16: 124 mg  1/17: 158 mg     Assessment/Recommendations:    - Recommend to continue current regimen as pt reports adequate pain relief.   - Patient may be discharged with OxyContin 20 mg BID for continuous pain relief along with OxyIR 30 mg Q3-4H PRN    - Please notify if you would like assistance with taper schedule (Pt is scheduled to see Dr. Leota Sauers outpt 08/11/12)     - Plans to follow outpatient w/ Ortho for injection to tx de Quervain tenosynovitis (was also provided w/ splint).    - Pt was somewhat withdrawn this morning and did not want to talk. Will re-visit later. Regarding sx of anxiety and depression and pt's refusal of Psych consult, could consider discussing starting SNRI/Cymbalta to help tx pain and anxiety/depression.     Please call [phone] with any questions or needs. Thank you.     Lanetta Inch, [facility] 08/01/2012, 8:53 AM    I reviewed the NP's note.  I agree with the findings and plan of care as documented in the NP's note.  Any exceptions/additions are edited/noted.    Lauretta Chester, MD 08/01/2012

## 2012-08-02 NOTE — Care Management Notes (Signed)
Referral Information  ++++++ Placed Provider #1 ++++++  Case Manager: Harriet Masson  Provider Type: Home Health  Provider Name: [facility]  Address:  [address]  [place], [place] [address]  Contact:    Fax:   Fax:

## 2012-08-03 NOTE — Care Management Notes (Addendum)
 [place]  [facility]  Care Management Note    Patient Name: Michele Mason  Date of Birth: Sep 30, 1975  Sex: female  Date/Time of Admission: 07/09/2012  1:37 PM  Room/Bed: 765/A  Payor:  MEDICAID  Plan: [facility] MEDICAID  Product Type: Medicaid     LOS: 23 days   PCP: No Established Pcp    Admitting Diagnosis:  gastric mass  gastric tumor  gastic cancer  Gastric cancer    Assessment:   CCC has attempted to contact patient on 2/19 ans 08/03/12 to inform her of her approval for the National drug Program. Patient voicemail states it is full and unable to except messages. Messages left on Michele Mason father voice mail to have Michele Mason to contact [facility]. Awaiting returned call. CCC will follow up with care management OM to see if form can be mailed to patient.       The patient will continue to be evaluated for developing discharge needs.     Case Manager: Alix Pereyra, RN 08/03/2012, 2:00 PM  Phone: [phone]    [facility] received call from [facility] r/t [employer] Drug therapy program. [facility] has informed Of the approval. Provided her with the authorization information and contact information. Michele Mason states to mail her a copy of the form. CCC will follow up with OM to follow process to provide patient a copy.

## 2012-08-07 NOTE — Care Management Notes (Signed)
 [place]  [facility]  Care Management Note    Patient Name: Michele Mason  Date of Birth: [birth year]  Sex: female  Date/Time of Admission: 07/09/2012  1:37 PM  Room/Bed: 765/A  Payor: [place] MEDICAID  Plan: [place] MEDICAID  Product Type: Medicaid     LOS: 23 days   PCP: No Established Pcp    Admitting Diagnosis:  gastric mass  gastric tumor  gastic cancer  Gastric cancer    Assessment:   CCC contacted patient via phone r/t Rational drug program. Voicemail left with patient that she will need to obtain copy of the form through HIM.       The patient will continue to be evaluated for developing discharge needs.     Case Manager: Alix Pereyra, RN 08/07/2012, 10:55 AM  Phone: [phone]

## 2012-08-11 ENCOUNTER — Encounter (HOSPITAL_BASED_OUTPATIENT_CLINIC_OR_DEPARTMENT_OTHER): Payer: Self-pay | Admitting: Hematology & Oncology

## 2012-08-11 ENCOUNTER — Other Ambulatory Visit (HOSPITAL_BASED_OUTPATIENT_CLINIC_OR_DEPARTMENT_OTHER): Payer: Self-pay | Admitting: Hematology & Oncology

## 2012-08-11 ENCOUNTER — Encounter (HOSPITAL_BASED_OUTPATIENT_CLINIC_OR_DEPARTMENT_OTHER): Payer: Self-pay

## 2012-08-11 ENCOUNTER — Ambulatory Visit (HOSPITAL_BASED_OUTPATIENT_CLINIC_OR_DEPARTMENT_OTHER): Payer: MEDICAID | Admitting: SURGICAL ONCOLOGY

## 2012-08-11 ENCOUNTER — Ambulatory Visit (HOSPITAL_BASED_OUTPATIENT_CLINIC_OR_DEPARTMENT_OTHER)
Admission: RE | Admit: 2012-08-11 | Discharge: 2012-08-11 | Disposition: A | Discharge status: Home / Self Care | Payer: MEDICAID | Source: Ambulatory Visit | Attending: Hematology & Oncology | Admitting: Hematology & Oncology

## 2012-08-11 ENCOUNTER — Ambulatory Visit
Admission: RE | Admit: 2012-08-11 | Discharge: 2012-08-11 | Disposition: A | Discharge status: Home / Self Care | Payer: MEDICAID | Source: Ambulatory Visit | Attending: Hematology & Oncology | Admitting: Hematology & Oncology

## 2012-08-11 VITALS — BP 113/77 | HR 118 | Temp 97.5°F | Resp 18 | Ht 61.0 in | Wt 135.4 lb

## 2012-08-11 VITALS — BP 124/84 | Temp 98.6°F | Ht 61.5 in | Wt 133.2 lb

## 2012-08-11 DIAGNOSIS — G8929 Other chronic pain: Secondary | ICD-10-CM | POA: Insufficient documentation

## 2012-08-11 DIAGNOSIS — K5900 Constipation, unspecified: Secondary | ICD-10-CM | POA: Insufficient documentation

## 2012-08-11 DIAGNOSIS — Z713 Dietary counseling and surveillance: Secondary | ICD-10-CM | POA: Insufficient documentation

## 2012-08-11 DIAGNOSIS — Z87891 Personal history of nicotine dependence: Secondary | ICD-10-CM | POA: Insufficient documentation

## 2012-08-11 DIAGNOSIS — C163 Malignant neoplasm of pyloric antrum: Secondary | ICD-10-CM | POA: Insufficient documentation

## 2012-08-11 DIAGNOSIS — R112 Nausea with vomiting, unspecified: Secondary | ICD-10-CM | POA: Insufficient documentation

## 2012-08-11 DIAGNOSIS — Z86718 Personal history of other venous thrombosis and embolism: Secondary | ICD-10-CM | POA: Insufficient documentation

## 2012-08-11 DIAGNOSIS — D869 Sarcoidosis, unspecified: Secondary | ICD-10-CM | POA: Insufficient documentation

## 2012-08-11 HISTORY — DX: Essential (primary) hypertension: I10

## 2012-08-11 HISTORY — DX: Acute embolism and thrombosis of unspecified vein: I82.90

## 2012-08-11 LAB — URINALYSIS, MICROSCOPIC
HYALINE CAST: 1 /LPF (ref 0–3)
RBC'S: NONE SEEN /HPF (ref 0–4)
WBC'S: <1 /HPF (ref 0–6)

## 2012-08-11 LAB — BLOOD CELL COUNT W/DIFF - CANCER CENTER
HCT: 28.8 % — ABNORMAL LOW (ref 33.5–45.2)
HGB: 9.3 g/dL — ABNORMAL LOW (ref 11.2–15.2)
LYMPHOCYTES: 16 %
MCH: 27.7 pg (ref 27.4–33.0)
MCHC: 32.4 g/dL — ABNORMAL LOW (ref 32.5–35.8)
MCV: 85.4 fL (ref 78–100)
MONOS ABS: 0.855 THOU/uL (ref 0.3–1.0)
MPV: 8.5 fL (ref 7.5–11.5)
PLATELET COUNT (AUTO): 198 THOU/uL (ref 140–450)
PLATELET COUNT: 198 THOU/uL (ref 140–450)
PMN'S: 65 %
RBC: 3.37 MIL/uL — ABNORMAL LOW (ref 3.63–4.92)
RDW: 14.9 % (ref 12.0–15.0)
WBC: 7.4 10*3/uL (ref 3.5–11.0)

## 2012-08-11 LAB — CREATININE
CREATININE: 0.79 mg/dL (ref 0.49–1.10)
ESTIMATED GLOMERULAR FILTRATION RATE: >59 mL/min/{1.73_m2} (ref >59)

## 2012-08-11 LAB — BILIRUBIN TOTAL: BILIRUBIN, TOTAL: 0.5 mg/dL (ref 0.3–1.3)

## 2012-08-11 LAB — SODIUM: SODIUM: 136 mmol/L (ref 136–145)

## 2012-08-11 LAB — CARCINOEMBRYONIC ANTIGEN: CARCINOEMBRYONIC AG: 0.9 ng/mL (ref <3.1)

## 2012-08-11 LAB — AST (SGOT): AST (SGOT): 19 U/L (ref 8–41)

## 2012-08-11 LAB — CARBON DIOXIDE (CO2, BICARBONATE)
ANION GAP: 6 mmol/L (ref 5–16)
CARBON DIOXIDE: 26 mmol/L (ref 22–32)

## 2012-08-11 LAB — CHLORIDE: CHLORIDE: 104 mmol/L (ref 96–111)

## 2012-08-11 LAB — LDH: LDH: 121 U/L (ref 98–192)

## 2012-08-11 LAB — ALT (SGPT): ALT (SGPT): 10 U/L (ref 7–45)

## 2012-08-11 LAB — ALK PHOS (ALKALINE PHOSPHATASE): ALKALINE PHOSPHATASE: 116 U/L (ref 38–126)

## 2012-08-11 LAB — POTASSIUM: POTASSIUM: 4.1 mmol/L (ref 3.5–5.1)

## 2012-08-11 MED ORDER — LIDOCAINE-PRILOCAINE 2.5 %-2.5 % TOPICAL CREAM
TOPICAL_CREAM | Freq: Once | CUTANEOUS | Status: AC
Start: 2012-08-11 — End: 2012-08-11

## 2012-08-11 MED ORDER — OXYCODONE 5 MG CAPSULE
15.0000 mg | ORAL_CAPSULE | ORAL | Status: DC | PRN
Start: 2012-08-11 — End: 2012-08-11

## 2012-08-11 MED ORDER — OXYCODONE 15 MG TABLET
15.00 mg | ORAL_TABLET | ORAL | Status: DC | PRN
Start: 2012-08-11 — End: 2012-10-20

## 2012-08-11 MED ORDER — METOCLOPRAMIDE 10 MG TABLET
10.0000 mg | ORAL_TABLET | Freq: Three times a day (TID) | ORAL | Status: DC
Start: 2012-08-11 — End: 2012-10-20

## 2012-08-11 MED ORDER — CYCLOBENZAPRINE 7.5 MG TABLET
15.0000 mg | ORAL_TABLET | Freq: Three times a day (TID) | ORAL | Status: DC | PRN
Start: 2012-08-11 — End: 2012-10-20

## 2012-08-11 NOTE — Cancer Center Note (Addendum)
[facility]   [place] [facility]    Date: 08/11/2012  Name: Michele Mason  MRN: [id number]  Referring Physician: [name], MD  Primary Care Provider: No Established Pcp    REASON FOR VISIT:37 y.o.female for evaluation and management of T3N0Mx gastric cancer.    ONCOLOGICAL HISTORY:    Michele Mason is 37 y.o. female accompanied by family who provided some of the history and is a [place] American female who presented to [facility] on 07/04/12. Patient was seen by Dr. 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 She was transferred to [facility].    07/19/12: She underwent Exploratory laparotomy by Dr. Leota Sauers, lysis of adhesions, prior abdominal scar excision. Distal gastrectomy and D2 lymphadenectomy. Lymph-vascular invasion: Not identified. Pathologic staging: pT3N0MX. Her-2 testing negative by IHC and FISH.    HISTORY OF PRESENT ILLNESS:   Patient states that over the past two months she has had a sharp stabbing pain in her abdomen each time she eats. The pain has been very unbearable causing her to have a twenty pound weight loss over that time period. She also complains of nausea and vomiting . She has past medical history of chronic pain, sarcoidosis, and question of Crohn's disease.   The patient had a prolonged ileus postop requiring TPN. She also had pain that was difficult to control for which Pain Team consult assisted in a regimen. Reglan was started and completed a flouro small bowel follow through which was  normal. An  She is tolerating  oral diet discharged home on 08/01/2012. She is here today for Medical oncology initial visit as well as postop visit with Dr. Leota Sauers. Patient had Port placement done by Dr. Leota Sauers during her hospitalization.   She is recovering adequately and has some nausea and postoperative abdominal pain, but over all feeling better now. She has some fatigue.    REVIEW OF SYSTEMS:  General: (-) pain. (-) fevers (-) chills. (+) weight loss. (+) fatigue.  Lymphatic: (-) palpable masses. (-) night sweats.  Heme: (-) easy bruising (-) bleeding.  (-) recurrent infections.   HEENT. (-) vision (-) hearing changes. (-) dysphagia. (-) sore throat.   Heart: (-) chest pain. (-) palpitation. (-) orthopnea. (-) LE edema.   Lungs: (-) dyspnea (on exertion) (-) hemoptysis. (-) cough.   Abdomen: (-) poor appetite. (+) abdominal pain. (+) nausea (-) vomiting. (-) diarrhea. (-) constipation.   GU: (+) dysuria (-) Urgency. (-) Hematuria.   MS. (-) joint pain (-) ext swelling. (-) Back pain.    Dermatologic: (-) rashes. (-) pruritus.   Psychiatric: (-) Depression. (-) anxiety. (-) insomnia.   Neurologic: (-) headaches. (-) neuropathy. (-) weakness. (-) memory problems.  Other review of systems negative.     MEDICATIONS:  Current Outpatient Prescriptions   Medication Sig   . atenolol (TENORMIN) 50 mg Oral Tablet Take 50 mg by mouth Once a day Patient states she only takes it as needed.   . Cyclobenzaprine 7.5 mg Oral Tablet Take 2 Tabs (15 mg total) by mouth Three times a day as needed   . lidocaine-prilocaine (EMLA) 2.5-2.5 % Apply externally Cream by Apply Topically route One time for 1 dose   . magnesium hydroxide (MILK OF MAGNESIA) 400 mg/5 mL Oral Suspension Take 30 mL (2,400 mg total) by mouth Every night   .  metoclopramide HCl (REGLAN) 10 mg Oral Tablet Take 1 Tab (10 mg total) by mouth Three times daily before meals   . oxyCODONE (OXYCONTIN) 20 mg Oral Tablet Sustained Release 12 hr Take 1 Tab (20 mg total) by mouth Every 12 hours   . oxyCODONE (ROXICODONE) 15 mg Oral Tablet Take 1 Tab (15 mg total) by mouth Every 4 hours as needed for Pain Take 1-2 tablets prn Earliest Fill Date: 08/11/12    . polyethylene glycol (MIRALAX) 17 gram Oral Powder in Packet Take 1 Packet (17 g total) by mouth Once per day as needed   . sennosides-docusate sodium (SENOKOT-S) 8.6-50 mg Oral Tablet Take 1 Tab by mouth Twice daily   . [DISCONTINUED] cyclobenzaprine 7.5 mg Oral Tablet Take 2 Tabs (15 mg total) by mouth Three times a day as needed for Muscle spasms   . [DISCONTINUED] metoclopramide HCl (REGLAN) 10 mg Oral Tablet Take 1 Tab (10 mg total) by mouth Three times daily before meals   . [DISCONTINUED] oxyCODONE (OXY IR) 5 mg Oral Capsule Take 3-6 Caps (15-30 mg total) by mouth Every 3 hours as needed   . [DISCONTINUED] oxyCODONE (OXY IR) 5 mg Oral Capsule Take 3-6 Caps (15-30 mg total) by mouth Every 3 hours as needed     ALLERGIES:  No Known Allergies  PAST MEDICAL HISTORY:  Past Medical History   Diagnosis Date   . Moderate protein-calorie malnutrition 07/10/2012   . Gastric cancer 07/10/2012   . Chronic pain disorder 07/10/2012   . Sarcoidosis 07/10/2012   . Cough    . Shortness of breath    . Palpitations    . CVA (cerebrovascular accident)      left hemipareisis   . Nutritional disorder    . Weight loss, abnormal    . Anemia    . Pulmonary embolism 2001   . DVT (deep vein thrombosis) in pregnancy 2001   . Anemia, chronic disease 07/23/2012   . De Quervain's tenosynovitis, left 07/23/2012   . Crohn disease 07/23/2012     Pt was treated for Crohn's for 10 years, has not had any relapses and no steroids for more than a decade.   . Personal history of PE (pulmonary embolism) 07/23/2012   . Anxiety 07/23/2012   . Clot    . Essential hypertension      PAST SURGICAL HISTORY:  Past Surgical History   Procedure Laterality Date   . Hx small bowel resection       twice   . Hx ovarian cyst removal     . Endoscopic ultrasound upper  07/11/2012     ENDOSCOPIC U/S UPPER performed by Knox Saliva, MD at [facility] OR ENDO   . Gastroscopy with biopsy  07/11/2012     GASTROSCOPY WITH BIOPSY performed by Knox Saliva, MD at [facility] OR ENDO   . Hx colonoscopy        SOCIAL HISTORY:  History     Social History   . Marital Status: Single     Spouse Name: N/A     Number of Children: N/A   . Years of Education: N/A     Occupational History   . Works as a Child psychotherapist.     Social History Main Topics   . Smoking status: Former Smoker -- 0.10 packs/day for 1 years     Types: Cigarettes   . Smokeless tobacco: Never Used   . Alcohol Use: No   . Drug Use: Not on file   .  Sexually Active: Not on file, LMP 07/19/12     Other Topics Concern   . Not on file     Social History Narrative   . No narrative on file     FAMILY HISTORY:  Family History   Problem Relation Age of Onset   . Sarcoidosis Mother    . Hepatitis B Mother      Hepatitis c   . Heart Attack Father    . Healthy Brother    . Breast Cancer Paternal Aunt 29     breast cancer   . Cancer Paternal Grandmother 40     brain tumor   . Colon Cancer Paternal Grandfather 37     colon cancer       PHYSICAL EXAMINATION:  Vitals:   Most Recent Vitals      Office Visit from 08/11/2012 in Oncology Surgery-POC    Temperature 37 C (98.6 F) filed at... 08/11/2012 1142    Heart Rate     Respiratory Rate     BP (Non-Invasive) 124/84 mmHg filed at... 08/11/2012 1142    Height 1.562 m (5' 1.5") filed at... 08/11/2012 1142    Weight 60.4 kg (133 lb 2.5 oz) filed at... 08/11/2012 1142    BMI (Calculated)     BSA (Calculated)         ECOG PS 1  Consitutional:  No acute distress.  Non-Toxic.   Eyes: EOMI. No discharge. No Jaundice.   ENT: Moist mucous membranes. No posterior pharynx lesions.   Heme/ Lymph: No Cervical, axillary lymph nodes. No Bruising.   Cardiovascular:  S1, S2,  No murmurs, rubs, or gallops.  Respiratory: Normal breath Sounds,   No rhonchi, crackles, or wheezing.  Abdomen: Normal Bowel Sounds, non-tender, nondistended. No hepatosplenomegaly.  Healing midline vertical incision with no drainage, erythema or bleeding.   MS: No pitting/trace edema.  Skin: Normal turgor. No Rashes, skin lesions   Psychiatry: Normal Affect. Intact memory. Cooperative.   Neuro: No focal deficits. Alert and Oriented x 3    LABORATORY:  Results for orders placed during the hospital encounter of 08/11/12 (from the past 36 hour(s))   SODIUM       Result Value Range    SODIUM 136  136 - 145 mmol/L   POTASSIUM       Result Value Range    POTASSIUM 4.1  3.5 - 5.1 mmol/L   CHLORIDE       Result Value Range    CHLORIDE 104  96 - 111 mmol/L   CARBON DIOXIDE (CO2, BICARBONATE)       Result Value Range    CARBON DIOXIDE 26  22 - 32 mmol/L    ANION GAP 6  5 - 16 mmol/L   LDH       Result Value Range    LDH 121  98 - 192 U/L   CREATININE       Result Value Range    CREATININE 0.79  0.49 - 1.10 mg/dL    ESTIMATED GLOMERULAR FILTRATION RATE >59  >59 ml/min/1.74m2   CARCINOEMBRYONIC ANTIGEN       Result Value Range    CARCINOEMBRYONIC AG 0.9  <3.1 ng/mL   BLOOD CELL COUNT W/DIFF - CANCER CENTER       Result Value Range    WBC 7.4  3.5 - 11.0 THOU/uL    RBC 3.37 (*) 3.63 - 4.92 MIL/uL    HGB 9.3 (*) 11.2 - 15.2 g/dL    HCT 11.9 (*)  33.5 - 45.2 %    MCV 85.4  78 - 100 fL    MCH 27.7  27.4 - 33.0 pg    MCHC 32.4 (*) 32.5 - 35.8 g/dL    RDW 29.5  62.1 - 30.8 %    PLATELET COUNT 198  140 - 450 THOU/uL    MPV 8.5  7.5 - 11.5 fL    PMN'S 65      PMN ABS 4.904  1.5 - 7.7 THOU/uL    LYMPHOCYTES 16      LYMPHS ABS 1.139  1.0 - 4.8 THOU/uL    MONOCYTES 12      MONOS ABS 0.855  0.3 - 1.0 THOU/uL    EOSINOPHIL 6      EOS ABS 0.414  0.0 - 0.5 THOU/uL    BASOPHILS 1      BASOS ABS 0.048  0.0 - 0.2 THOU/uL    PMN ABS 4.904  1.5 - 7.7 THOU/uL    PLATELET COUNT 198  140 - 450 THOU/uL   BILIRUBIN TOTAL       Result Value Range    BILIRUBIN, TOTAL 0.5  0.3 - 1.3 mg/dL   AST (SGOT)       Result Value Range    AST (SGOT) 19  8 - 41 U/L   ALT (SGPT)       Result Value Range    ALT (SGPT) 10  7 - 45 U/L   ALK PHOS (ALKALINE PHOSPHATASE)       Result Value Range    ALKALINE PHOSPHATASE 116  38 - 126 U/L   URINALYSIS, MICROSCOPIC       Result Value Range     RBC'S    0 - 4 /HPF    Value: NONE SEEN- MICROSCOPIC ANALYSIS PERFORMED BY AUTOMATED METHOD    WBC'S <1  0 - 6 /HPF    BACTERIA OCCASIONAL OR LESS  OCL^OCCASIONAL OR LESS    SQUAMOUS EPITHELIAL SEVERAL      MUCOUS LIGHT      HYALINE CAST 1  0 - 3 /LPF    CALICUM OXALATE CRYSTAL RARE      IRIS COMMENTS Test Not Performed        PATHOLOGY 07/19/12:  GASTRIC CANCER CASE SUMMARY  Specimen: Portion of stomach (gastric antrum) and proximal duodenum.  Procedure: Partial gastrectomy, distal.   Tumor site: Antrum and pylorus.  Tumor size: 4.2 x 3.5 x 3.0 cm.   Histologic type: Adenocarcinoma, intestinal-type.  Histologic grade: Grade 2 (moderately differentiated).  Microscopic extent of tumor: Tumor invades subserosal connective tissue  (greater omentum) without involvement of visceral peritoneum.  Margins: All margins negative for malignancy.   Closest margin: 2.0 cm (distal duodenal margin).  Treatment effect: No prior treatment.   Lymph-vascular invasion: Not identified.  Pathologic staging: pT3N0MX.  Number of lymph nodes examined: 7.  Number of lymph nodes involved: 0.  Additional pathologic findings: Intestinal metaplasia, chronic inactive  gastritis with numerous eosinophils, no H. pylori organisms seen on H&E.  Her 2 testing negative by IHC: 0, FISH unamplified.    IMAGING:  I have reviewed the imaging studies and discussed them with the patient.  CT chest PE protocol: 07/15/12;  IMPRESSION:   1. No evidence for pulmonary embolus.   2. mediastinal lymphadenopathy with bilateral groundglass opacities may represent an infectious process. Recommend interval followup to ensure resolution of groundglass nodule on the left.     ASSESSMENT:  1.  37 y.o. female s/p Distal  gastrectomy and D2 lymphadenectomy showing Gastric cancer Tumor size: 4.2 x 3.5 x 3.0 cm. Adenocarcinoma, intestinal-type.Grade 2 (moderately differentiated).Pathologic staging: pT3N0MX. Her-2 testing negative by IHC and FISH.   2. History of multiple cancers in the family.  3. She has past medical history of chronic pain, sarcoidosis, and question of Crohn's disease and multiple abdominal surgeries.  4. Past history of DVT.  PLAN:  Adjuvant chemotherapy would be recommended for any T stage with N1 disease (which would include T1N1 stage IB) and for patients with T3N0 (stage IIA) disease. Meta-analyses provide support for a survival benefit from adjuvant chemotherapy following complete resection of gastric cancer as in the case of this patient with T3N0 disease, although there is no consensus as to the best approach. One common approach is chemotherapy alone (either following surgery or combined preoperative and postoperative administration as in the multinational MAGIC trial is the preferred treatment strategy. However,  a large American Intergroup trial (ZOX0960) demonstrated a significant survival benefit for chemoradiotherapy after complete resection. The INT0116 study established postoperative chemoradiotherapy as a standard approach for completely resected gastric cancer; however, the optimal regimen has not yet been established. In INT0116, the chemoradiotherapy component was given with bolus IV FU and leucovorin on days 1 through 4 and on the last three days of RT. However given the better tolerability and comparable efficacy of daily low-dose infusional FU in other gastrointestinal sites such as rectal cancer, most clinicians utilize continuous infusion FU (200 mg/m2 daily) during RT instead of bolus FU plus leucovorin, or based on  the ARTIST trial, give daily oral capecitabine (825 mg/m2 twice daily five days per week on radiation days)     Prescription was renewed for oxycodone 15 mg, Reglan, Flexeril and also given prescription for Emla cream for port site. Patient seen by Dr. Leota Sauers for postoperative follow up today. She was also seen by Child psychotherapist and dietician today. We will refer her for genetic counseling given her young age of diagnosis and multiple cancers in the family and co-ordinate that with her next visit here on 09/04/12 to see Christen [place].     She will return to clinic in 3 weeks with a PET/CT to finalize the adjuvant treatment plans and at present she is undecided if she wants to be treated at [facility] or get chemoradiation closer to home in [place] or [place]. We will make the referral to local medical oncologist and radiation oncologist if patient decides to get treatment closer to home, otherwise she will plan to stay in family house during treatment here.    Threasa Alpha, MD   Associate Professor,   Section of Hematology/Oncology   [facility] Department of Medicine

## 2012-08-11 NOTE — OR Surgeon (Signed)
[facility]                                  DEPARTMENT OF ORTHOPAEDICS                                     OPERATION SUMMARY    PATIENT NAME: ADELEIGH, BARLETTA  HOSPITAL IONGEX:[id number]  DATE OF SERVICE:08/01/2012  DATE OF BIRTH: 1975-12-17    PREOPERATIVE DIAGNOSIS:  Lollie Sails tenosynovitis and cancer.    POSTOPERATIVE DIAGNOSIS:  Lollie Sails tenosynovitis and cancer.     NAME OF PROCEDURE:  Injection of first dorsal compartment of the left wrist for de Quervain tenosynovitis.    DESCRIPTION OF PROCEDURE:  After obtaining verbal informed consent, I prepped Ms. Thorley's left wrist thoroughly with a ChloraPrep scrubbing it in really good and letting it thoroughly dry.  I then injected into the first dorsal compartment in and amongst the tendons a mixture of Celestone and lidocaine, 1 mL of each.  I felt the injection passed into the tendon sheath and go up as well as down into the wrist.  The injection site was done just at the distal extent of the extensor retinaculum.  She tolerated it well.  She said it felt better instantly from the numbing medicine.  I told her to continue wearing her thumb splint for anything heavy until she had no pain for 2 weeks.  I told her the injection would last about 6 weeks.  If it helps but does not go all the way away, she can repeat the injection.      Gypsy Decant, MD  [facility] Chair of Orthopedic Surgery  Associate Professor  [facility] Department of Orthopaedics    [id number]; D: 08/10/2012 17:03:23; T: 08/11/2012 12:25:02

## 2012-08-11 NOTE — Progress Notes (Signed)
Pt seen for New Patient visit. Reviewed our multidisciplinary program and [facility]. New patient assessment done and Distress scale of 5/10. Pt functional screening detailed and history of familial cancers noted and documented. Pt with father, relates " I have been sick all my life' - history of sarcoidosis, Crohns, and IPT as per pt. Pt seen by Britt Boozer, MSW and by our dietitian as well. Dr. Wallis Bamberg consulting with patient to review the plan of care. Work excuse provided to Mr. Bodner for attending consulation with his daughter. Follow up PET to be scheduled . Tereasa Coop

## 2012-08-11 NOTE — Ancillary Notes (Signed)
Met with patient due to distress level of 5 or above. Patient states she lived in [place] until a couple of years ago; and was in college to become a Engineer, civil (consulting). She is now with her parents in [place], [place]; only recently was able to obtain medicaid coverage. Patient very cooperative; states she is not suicidal. She states she has had many health problems throughout her life; and now cancer. She states at times it is a lot to deal with; states she will pursue seeing a counselor if she feels the need. Patient states she has OCD; and her hospitalization was difficult due to that.  Information provided on NEMT; she was unaware of this benefit. Provided forms for today's visit; and blanks for her local medical appointments. Instructed her to turn it in to her local [facility]. Information provided on Look Good, Feel Better; provided ACS number so she can find a local class.  Name and number provided to patient. Encouraged her to call if she wants to see a mental health professional; and needs assistance locating one in her area.  Patient with no further questions or complaints.

## 2012-08-14 NOTE — Progress Notes (Addendum)
LATE ENTRY FOR 08/11/2012    PATIENT NAME:  Michele Mason  CHART NUMBER;  [id number]  DATE OF BIRTH: 08-Jul-1975  DATE OF SERVICE: 08/11/2012    MEDICAL NUTRITION THERAPY ASSESSMENT - NEW PT    Reason for Assessment:  Diagnosis of Gastric Cancer    Ht:  154.9 cm (61")  Wt:  61.4 kg (135#)  IBW:  47.7 kg (105#)  %IBW:  128%  Adj BW:  51.1 kg (113#)  BMI:  25.5  UBW:  146#    Estimated Nutrition Needs:  Calories:  30 - 35 cal/kg Adj BW = 1535 - 1800 calories/day  Protein:  1.3 -1.5 gram/kg IBW = 60 - 70 gram/day    Intervention/Evaluation:  Provided education on protein needs and sources.  Addressed the importance of fruits, vegetables and adequate fluids in the diet.      Comments:  Pt provided a very vague recall regarding what she is able to eat. She is able to drink Apple juice, Snapple tea and water.  She drank Ensure while a pt in the hospital.    I provided pt with information about protein rich foods and her goal for intake.  I provided information about our discounted supplement program and provided a case of Ensure Complete using funds from the [employer].  I also encouraged pt to eat small frequent meals and provided ideas.  She was receptive to information.  Name and number provided.  Will follow.    Greater than 15 minutes spent interviewing and counseling pt.    Lance Sell, RD, LD  Oncology Dietitian

## 2012-08-16 ENCOUNTER — Encounter (INDEPENDENT_AMBULATORY_CARE_PROVIDER_SITE_OTHER): Payer: MEDICAID | Admitting: SURGERY OF THE HAND

## 2012-08-16 NOTE — Care Management Notes (Signed)
[facility]  Care Management Note    Patient Name: Michele Mason  Date of Birth: 1975/07/19  Sex: female  Date/Time of Admission: 07/09/2012  1:37 PM  Room/Bed: 765/A  Payor: [place] MEDICAID  Plan: [facility] MEDICAID  Product Type: Medicaid     LOS: 23 days   PCP: No Established Pcp    Admitting Diagnosis:  gastric mass  gastric tumor  gastic cancer  Gastric cancer    Assessment:    CCC received a call from Clydie Braun, Production designer, theatre/television/film, [facility] states that patient is upset requested agency to provide more visits and other type of agencies to sit with patient. States CCC will possibly receive call from [facility].. CCC hung up from call and received a call from [facility]. Patient very upset/emotional stating that she is not being seen as often as she wants and that Genevieve Norlander is not getting her help from other agencies to stay with her. 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 [place] was asked by [facility] if she would want to change [facility] agencies. Marji stated no I don't know who I would want I told you that my friend works there and I want them. Maegen refused to have [facility] company changed.  CCC recommended patient go to the nearest ER to be checked out but patient got very upset with CCC and hung up on the [facility]. CCC contacted Joen Laura and explained to Clydie Braun what the patient concerns are. Clydie Braun stated that she has reviewed all patient HH visits and that she been seen by Nursing and PT multiple times. Clydie Braun has agreed to front load visit starting today to meet patient needs and to provide patient with community resources that Patient can call to arrange for help. CCC contacted Mayola again to discuss the plan with Rilya per karen and she stated that she was happy with that plan. CCC informed Ednah if [facility] doesn't call or show up to her home by three today make sure she contacts Clydie Braun, Production designer, theatre/television/film or April director of [facility]. Patient stated understanding.       Case Manager: Harriet Masson, RN 08/16/2012, 2:03 PM  Phone: [phone]

## 2012-08-17 ENCOUNTER — Telehealth (HOSPITAL_BASED_OUTPATIENT_CLINIC_OR_DEPARTMENT_OTHER): Payer: Self-pay | Admitting: Hematology & Oncology

## 2012-08-17 NOTE — Progress Notes (Signed)
SURGICAL ONCOLOGY POST-OPERATIVE EVALUATION:    PATIENT:  Michele Mason  MRN:  [id number]  DOB:   09/20/1975  DATE:  08/11/2012    CANCER DIAGNOSIS AND STAGE: stage IIA, pT3 pN0 cM0, intestinal type gastric cancer.    SURGICAL THERAPY:   1. 07/19/2012 - exploratory laparotomy, lysis of adhesions, distal gastrectomy, D2 lymphadenectomy, umbilical hernia repair  2. 07/24/2012 - R IJ port    SYSTEMIC THERAPY: Pending.    CLINICAL TRIAL: Pending.    CHIEF COMPLAINT: Post-operative evaluation.    HISTORY OF PRESENT ILLNESS:  Michele Mason is a 37 y.o. Black/African American female who returns to clinic today for routine post-operative evaluation following distal gastrectomy on 07/19/2012.  The patient's post-operative course was complicated by slow advancement to regular diet.  They were discharged to Home w/PT and Nsg on 08/01/2012.    Today, she presents with complaints of occasional nausea and vomiting, constipation.  The patient's main questions and concerns at this time are related to adjuvant therapy.  She has gotten in touch with [employer], a non-profit organization that helps to support patients with gastric cancer.  During today's visit, the patient denies drainage, redness/inflammation or tenderness of her surgical incision or drain sites and states that her incisions have healed well.      REVIEW OF SYSTEMS:   Constitutional: pleasant, cooperative, denies fevers, chills or weight loss   HEENT: denies headache, dizziness, double vision, tinnitus, nasal congestion, or difficulty swallowing   Cardiovascular: denies chest pain, palpitations, dyspnea on exertion   Respiratory: denies SOB, cough   Gastrointestinal (GI): endorses some nausea, occasional vomiting, some constipation, eating much better; denies abdominal pain, diarrhea   Genitourinary (GU): denies dysuria, hematuria   Gynecologic (GYN): denies vaginal discharge, abnormal vaginal bleeding    Musculoskeletal: endorses getting stronger overall; denies weakness, joint pain   Endocrine: denies polydipsia, polyuria   Neurological: denies dizziness, facial droop, focal motor/sensory deficits   Psychiatric: denies anxiety, depression, sleeping disturbances     PAST MEDICAL, PAST SURGICAL, FAMILY, AND SOCIAL HISTORY:  The patient denies any changes to this portion of her history since her discharge on 08/01/2012.    HOME MEDICATIONS:  atenolol;  Cyclobenzaprine;  magnesium hydroxide;  metoclopramide HCl;  oxyCODONE;  oxyCODONE;  polyethylene glycol;  sennosides-docusate sodium    ALLERGIES:  No Known Allergies  PHYSICAL EXAMINATION:  ECOG Performance Status: 0 - Asymptomatic  Karnofsky Performance Status: 90% - capable of normal activity, few symptoms or signs of disease  General: pleasant, well-developed, well-nourished Black/African American female who is in no acute distress.    Vital Signs: BP 124/84   Temp(Src) 37 C (98.6 F) (Thermal Scan)   Ht 1.562 m (5' 1.5")   Wt 60.4 kg (133 lb 2.5 oz)   BMI 24.76 kg/m2  Neck: supple, trachea is midline, thyroid is symmetric, not enlarged, and without nodularity  Lymphatics: no palpable lymphadenopathy of the cervical, supraclavicular, axillary, or inguinal nodal basins bilaterally  Cardiovascular: regular rate and rhythm, S1, S2 normal, no murmur, click, rub or gallop  Pulmonary: normal effort, chest expands symmetrically, lungs are clear to auscultation bilaterally.   Abdomen: non-distended, normal bowel sounds, no bruits, soft, nontender, no organomegaly or masses, incision is well healed, no erythema or drainage, no signs of infection  Extremities: without deformity, cyanosis, or edema  Neurologic: alert and oriented to person, place, time, and situation, cranial nerves grossly intact, no focal motor or sensory deficits  Psychiatric: speech pattern and movements are normal, normal mood,  affect and judgment     LABORATORY REVIEW (I have personally reviewed all recent laboratory studies):  1. 08/11/2012 from [facility]: WBC 7.4, Hgb 9.3, plt 198, with normal MCV and differential. Na 136, K 4.1, Cl 104, bicarbonate 26, creatinine 0.79, total bilirubin 0.5, AST 19, ALT 10, alkaline phospatase 116.  CEA 0.9.    IMAGING REVIEW (I have personally reviewed both the reports and images):  None.  PET pending in late March.    PATHOLOGY REVIEW:   1. 07/19/2012 SKIN, SCAR, EXCISION: Scar.  PELVIC OMENTUM, RESECTION: Unremarkable adipose tissue.  PORTAL LYMPH NODE, BIOPSY: One lymph node, negative for malignancy (0/1). Non-caseating granulomas (see comment).  STOMACH, DUODENAL BULB, AND OMENTUM; PARTIAL GASTRECTOMY:  - Moderately differentiated adenocarcinoma, intestinal-type, pT3N0 (see Cancer Case Summary). Two lymph nodes, negative for malignancy (0/2).  STOMACH, PROXIMAL LESSER CURVATURE MARGIN, BIOPSY: Negative for malignancy.  Focal intestinal metaplasia.  STOMACH, PROXIMAL GREATER CURVATURE MARGIN, BIOPSY: Negative for malignancy.  DUODENUM, DISTAL MARGIN, BIOPSY:  One microscopic lymph node, negative for malignancy (0/1).  LYMPH NODES, D2, RESECTION: Three lymph nodes, negative for malignancy (0/3).  Non-caseating granulomas (see comment). STOMACH, FINAL PROXIMAL LESSER CURVATURE MARGIN: Negative for malignancy. An additional eleven lymph nodes were submitted and all were negative for malignancy, bringing the total to 18 lymph nodes, all negative for malignancy (0/18).  T3 N0.    ASSESSMENT:  37 y.o. Black/African American female with stage IIA, pT3 pN0 cM0, intestinal type gastric cancer.   Doing very well post-operatively.    PLAN:  1. The patient's post-operative course, physical examination, and pathology reports (a printed report was given) were discussed with her in detail today.  She is doing quite well post-operatively from her distal gastrectomy without complications.   2. The patient will see Dr. Wallis Bamberg with medical oncology for further discussions of systemic therapies.  She will likely require chemoradiation (with a 5-FU derivative).  3. I have discussed her comorbid conditions and how they may impact surgical healing or further therapy, including her sarcoid and how this will make following her gastric cancer more difficult as her lymph nodes may be PET avid.  4. I have advised her to continue small, more frequent meals and to adhere to no food before bedtime, sitting up after meals, etc.  She does not have any symptoms of delayed gastric emptying or biliary reflux at this time.  5. I will ask that she return to my clinic in 3 months for follow-up and repeat clinical examination.  6. All of the patient's and her family member's questions were answered to best of my ability and to their satisfaction. They seem to understand the disease process and the above mentioned treatment algorithm. After deliberation, she agrees to proceed as outlined.  7. The patient has my business card containing all of my professional contact information and I have instructed her to call with new or worsening symptoms, questions, or concerns.  8. I counseled the patient for 10 of the 15 minute visit about their disease.    [name], MD 08/17/2012 6:19 PM  Assistant Professor, Division of Surgical Oncology  [facility] Department of Surgery

## 2012-08-17 NOTE — Ancillary Notes (Signed)
Patient has appointments beginning at 6:30am on 09-04-12. Message left regarding NEMT and possible need for lodging the night before; asked her to call me.

## 2012-08-18 ENCOUNTER — Encounter (HOSPITAL_COMMUNITY): Payer: Self-pay

## 2012-08-21 ENCOUNTER — Ambulatory Visit (INDEPENDENT_AMBULATORY_CARE_PROVIDER_SITE_OTHER): Payer: MEDICAID | Admitting: SURGERY OF THE HAND

## 2012-08-28 ENCOUNTER — Ambulatory Visit (HOSPITAL_BASED_OUTPATIENT_CLINIC_OR_DEPARTMENT_OTHER): Payer: Self-pay | Admitting: Hematology & Oncology

## 2012-08-28 ENCOUNTER — Other Ambulatory Visit (HOSPITAL_BASED_OUTPATIENT_CLINIC_OR_DEPARTMENT_OTHER): Payer: Self-pay | Admitting: Hematology & Oncology

## 2012-08-28 ENCOUNTER — Encounter (HOSPITAL_BASED_OUTPATIENT_CLINIC_OR_DEPARTMENT_OTHER): Payer: Self-pay | Admitting: Hematology & Oncology

## 2012-08-28 NOTE — Telephone Encounter (Addendum)
Pt calls crying, states she took her last Oxycodone today. Pt states she had a tooth pulled on 3/10 and tooth was infected prior but she could not take antibiotic because it made her nauseated. Pt has been taking increased pain med due to abd pain from surgery, plus now having sharp pain in stomach that radiates to her back, and tooth pain from extraction. Pt is also nauseated, has not eaten for 2 days. Pt is due to have another tooth extraction on 3/20. Pt report she has 6 Oxycontin left due it causing her nausea. Pt has no antiemetics.Pt was educated that it may take up to 3 days for certified mail and infusture not to wait til she takes her last pain med to call for refill, pt verbalized understanding, says this is all new to her. Pt has appt on 3/24 for lab/Dr and infusion.[id number] Dr Charlotte Sanes paged. BBauerRN  Dr Charlotte Sanes recc pt go to local ED to be evaluated and CT. 1458 Pt notified of Dr Alsheri's recc, and agrees with recc, states will go to ED to be evaluated. BBauerRN

## 2012-08-28 NOTE — Progress Notes (Signed)
 Consuelo, Referral Coordinator , attempting to schedule Genetics appt. Order needed, and order entered for genetics clinic for patient to follow up due to young age of diagnosis and family history. Consuelo called to update order in and please schedule. Sharlet Pizza, RN

## 2012-09-04 ENCOUNTER — Encounter (HOSPITAL_BASED_OUTPATIENT_CLINIC_OR_DEPARTMENT_OTHER): Payer: Self-pay

## 2012-09-04 ENCOUNTER — Ambulatory Visit (HOSPITAL_BASED_OUTPATIENT_CLINIC_OR_DEPARTMENT_OTHER)
Admission: RE | Admit: 2012-09-04 | Discharge: 2012-09-04 | Disposition: A | Discharge status: Home / Self Care | Payer: MEDICAID | Source: Ambulatory Visit

## 2012-09-04 ENCOUNTER — Encounter (HOSPITAL_BASED_OUTPATIENT_CLINIC_OR_DEPARTMENT_OTHER): Payer: Self-pay | Admitting: Hematology & Oncology

## 2012-09-04 ENCOUNTER — Ambulatory Visit
Admission: RE | Admit: 2012-09-04 | Discharge: 2012-09-04 | Disposition: A | Discharge status: Home / Self Care | Payer: MEDICAID | Source: Ambulatory Visit | Attending: Hematology & Oncology | Admitting: Hematology & Oncology

## 2012-09-04 ENCOUNTER — Ambulatory Visit (HOSPITAL_BASED_OUTPATIENT_CLINIC_OR_DEPARTMENT_OTHER): Payer: Self-pay | Admitting: Medical

## 2012-09-04 ENCOUNTER — Ambulatory Visit (HOSPITAL_BASED_OUTPATIENT_CLINIC_OR_DEPARTMENT_OTHER)
Admission: RE | Admit: 2012-09-04 | Discharge: 2012-09-04 | Disposition: A | Discharge status: Home / Self Care | Payer: MEDICAID | Source: Ambulatory Visit | Admitting: Hematology & Oncology

## 2012-09-04 DIAGNOSIS — G893 Neoplasm related pain (acute) (chronic): Secondary | ICD-10-CM

## 2012-09-04 DIAGNOSIS — C169 Malignant neoplasm of stomach, unspecified: Secondary | ICD-10-CM

## 2012-09-04 LAB — BLOOD CELL COUNT W/DIFF - CANCER CENTER
BASOPHILS: 1 %
BASOS ABS: 0.073 10*3/uL (ref 0.0–0.2)
EOS ABS: 0.181 THOU/uL (ref 0.0–0.5)
EOSINOPHIL: 3 %
HCT: 30 % — ABNORMAL LOW (ref 33.5–45.2)
HGB: 10 g/dL — ABNORMAL LOW (ref 11.2–15.2)
LYMPHOCYTES: 20 %
LYMPHS ABS: 1.245 10*3/uL (ref 1.0–4.8)
MCH: 27.4 pg (ref 27.4–33.0)
MCHC: 33.2 g/dL (ref 32.5–35.8)
MCV: 82.5 fL (ref 78–100)
MONOCYTES: 13 %
MONOS ABS: 0.808 10*3/uL (ref 0.3–1.0)
MPV: 8.6 fL (ref 7.5–11.5)
PLATELET COUNT (AUTO): 119 10*3/uL — ABNORMAL LOW (ref 140–450)
PLATELET COUNT: 119 THOU/uL — ABNORMAL LOW (ref 140–450)
PMN ABS (AUTO): 4.069 10*3/uL (ref 1.5–7.7)
PMN ABS: 4.069 10*3/uL (ref 1.5–7.7)
PMN'S: 63 %
RBC: 3.64 MIL/uL (ref 3.63–4.92)
RDW: 14.9 % (ref 12.0–15.0)
WBC: 6.4 10*3/uL (ref 3.5–11.0)

## 2012-09-04 LAB — CREATININE WITH EGFR: CREATININE: 0.7 mg/dL (ref 0.49–1.10)

## 2012-09-04 LAB — AST (SGOT): AST (SGOT): 17 U/L (ref 8–45)

## 2012-09-04 LAB — PERFORM POC WHOLE BLOOD GLUCOSE: GLUCOSE, POINT OF CARE: 79 mg/dL (ref 70–105)

## 2012-09-04 LAB — ALK PHOS (ALKALINE PHOSPHATASE): ALKALINE PHOSPHATASE: 85 U/L (ref <150)

## 2012-09-04 LAB — ALT (SGPT): ALT (SGPT): 6 U/L (ref <55)

## 2012-09-04 LAB — CARCINOEMBRYONIC ANTIGEN: CARCINOEMBRYONIC AG: 1.9 ng/mL (ref <5.0)

## 2012-09-04 LAB — BILIRUBIN TOTAL: BILIRUBIN, TOTAL: 0.3 mg/dL (ref 0.3–1.3)

## 2012-09-04 LAB — LDH: LDH: 174 U/L (ref 125–220)

## 2012-09-04 LAB — CREATININE: ESTIMATED GLOMERULAR FILTRATION RATE: >59 mL/min/{1.73_m2} (ref >59)

## 2012-09-04 MED ORDER — TIZANIDINE 4 MG TABLET
4.00 mg | ORAL_TABLET | Freq: Three times a day (TID) | ORAL | Status: DC | PRN
Start: 2012-09-04 — End: 2012-11-10

## 2012-09-04 MED ORDER — ONDANSETRON HCL 4 MG TABLET
4.00 mg | ORAL_TABLET | Freq: Three times a day (TID) | ORAL | Status: DC | PRN
Start: 2012-09-04 — End: 2014-04-19

## 2012-09-04 MED ORDER — METOCLOPRAMIDE 10 MG TABLET
10.00 mg | ORAL_TABLET | Freq: Three times a day (TID) | ORAL | Status: DC
Start: 2012-09-04 — End: 2012-10-20

## 2012-09-04 MED ORDER — OXYCODONE ER 20 MG TABLET,EXTENDED RELEASE,12 HR
20.00 mg | ORAL_TABLET | Freq: Two times a day (BID) | ORAL | Status: DC
Start: 2012-09-04 — End: 2012-10-20

## 2012-09-04 MED ORDER — OXYCODONE 15 MG TABLET
15.00 mg | ORAL_TABLET | ORAL | Status: DC | PRN
Start: 2012-09-04 — End: 2012-10-20

## 2012-09-04 MED ORDER — RANITIDINE 300 MG TABLET
300.00 mg | ORAL_TABLET | Freq: Every evening | ORAL | Status: DC
Start: 2012-09-04 — End: 2014-04-19

## 2012-09-04 MED ORDER — ATENOLOL 50 MG TABLET
50.0000 mg | ORAL_TABLET | Freq: Every day | ORAL | Status: DC
Start: 2012-09-04 — End: 2012-10-20

## 2012-09-04 MED ADMIN — diatrizoate meglumine and diat.sodium 66 %-10 % oral solution: 10 mL | ORAL | @ 08:00:00 | NDC 00270044535

## 2012-09-04 MED ADMIN — iopamidoL 300 mg iodine/mL (61 %) intravenous solution: 100 mL | INTRAVENOUS | @ 09:00:00 | NDC 00270131535

## 2012-09-04 NOTE — Cancer Center Note (Addendum)
[facility]   [facility]    Date: 09/04/2012  Name: Michele Mason  MRN: [id number]  Primary Care Provider: No Established Pcp    REASON FOR VISIT: 37 y.o.female for evaluation and management of gastric cancer.  HISTORY OF PRESENT ILLNESS:   07/04/12. Patient was seen by Dr. 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 She was transferred to [facility].   07/19/12: She underwent Exploratory laparotomy by Dr. Leota Sauers, lysis of adhesions, prior abdominal scar excision. Distal gastrectomy and D2 lymphadenectomy. Lymph-vascular invasion: Not identified. Pathologic staging: pT3N0MX. Her-2 testing negative by IHC and FISH    Michele Mason is 37 y.o. female who is here for follow up. The patient is accompanied by her father who provided some of the history.   Notes she has been doing about the same as last encounter.  She continues to endorse pain in her abdomen and flanks.   Pain worse with movement.  Better with oxycodone.  Notes she needs refills on her pain medications.   There is associated SOB.  Breathing is worse with exertion.   She notes known history of sarcoidosis.  Sarcoidosis diagnosed previously in state of [place].    Reports history of migraines.  Notes that her migraines are worse with flexeril.    She feels anxious given her diagnosis and is concerned about treatment options.       REVIEW OF SYSTEMS:  General: (+) pain. (-) fevers (+) chills. (-) weight loss. (-) fatigue. + poor appetite  Lymphatic: (-) palpable masses. (+) night sweats.  Heme: + easy bruising (-) bleeding.  (-) recurrent infections.   HEENT. (-) vision (-) hearing changes. (-) dysphagia. (-) sore throat.   Heart: (-) chest pain. (-) palpitation. (-) orthopnea. (-) LE edema.    Lungs: (-) dyspnea (on exertion) (-) hemoptysis. (-) cough. + SOB  Abdomen: (-) poor appetite. (+) abdominal pain. (+) nausea (+) vomiting. (-) diarrhea. (+) constipation.   GU: (-) dysuria (-) Urgency. (-) Hematuria.   MS. (+) joint pain (-) ext swelling. (+) Back pain.    Dermatologic: (-) rashes. (-) pruritus.   Psychiatric: (-) Depression. (-) anxiety. (+) insomnia.   Neurologic: (+) headaches. (-) neuropathy. (-) weakness. (-) memory problems.  Other review of systems negative.     PAST MEDICAL HISTORY:  Past Medical History   Diagnosis Date   . Moderate protein-calorie malnutrition 07/10/2012   . Gastric cancer 07/10/2012   . Chronic pain disorder 07/10/2012   . Sarcoidosis 07/10/2012   . Cough    . Shortness of breath    . Palpitations    . CVA (cerebrovascular accident)      left hemipareisis   . Nutritional disorder    . Weight loss, abnormal    . Anemia    . Pulmonary embolism 2001   . DVT (deep vein thrombosis) in pregnancy 2001   . Anemia, chronic disease 07/23/2012   . De Quervain's tenosynovitis, left 07/23/2012   . Crohn disease 07/23/2012     Pt was treated for Crohn's for 10 years, has not had any relapses and no steroids for more than a decade.   . Personal history of PE (pulmonary embolism) 07/23/2012   . Anxiety 07/23/2012   . Clot    . Essential hypertension  ALLERGIES:  Allergies   Allergen Reactions   . Adhesive Rash and Itching     MEDICATIONS:  Current Outpatient Prescriptions   Medication Sig   . atenolol (TENORMIN) 50 mg Oral Tablet Take 50 mg by mouth Once a day Patient states she only takes it as needed.   . Cyclobenzaprine 7.5 mg Oral Tablet Take 2 Tabs (15 mg total) by mouth Three times a day as needed   . magnesium hydroxide (MILK OF MAGNESIA) 400 mg/5 mL Oral Suspension Take 30 mL (2,400 mg total) by mouth Every night   . metoclopramide HCl (REGLAN) 10 mg Oral Tablet Take 1 Tab (10 mg total) by mouth Three times daily before meals    . oxyCODONE (OXYCONTIN) 20 mg Oral Tablet Sustained Release 12 hr Take 1 Tab (20 mg total) by mouth Every 12 hours   . oxyCODONE (ROXICODONE) 15 mg Oral Tablet Take 1 Tab (15 mg total) by mouth Every 4 hours as needed for Pain Take 1-2 tablets prn Earliest Fill Date: 08/11/12   . polyethylene glycol (MIRALAX) 17 gram Oral Powder in Packet Take 1 Packet (17 g total) by mouth Once per day as needed   . sennosides-docusate sodium (SENOKOT-S) 8.6-50 mg Oral Tablet Take 1 Tab by mouth Twice daily     Family History   Problem Relation Age of Onset   . Sarcoidosis Mother    . Hepatitis B Mother      Hepatitis c   . Heart Attack Father    . Healthy Brother    . Breast Cancer Paternal Aunt 58     breast cancer   . Cancer Paternal Grandmother 40     brain tumor   . Colon Cancer Paternal Grandfather 60     colon cancer         PHYSICAL EXAMINATION:  Vitals:   Most Recent Vitals      Lab from 09/04/2012 in [facility]    Temperature 37.1 C (98.8 F) filed at... 09/04/2012 1118    Heart Rate 92 filed at... 09/04/2012 1118    Respiratory Rate 20 filed at... 09/04/2012 1118    BP (Non-Invasive) ! 140/98 mmHg filed at... 09/04/2012 1118    Height 1.549 m (5\' 1" ) filed at... 09/04/2012 1118    Weight 60.9 kg (134 lb 4.2 oz) filed at... 09/04/2012 1118    BMI (Calculated) 25.42 filed at... 09/04/2012 1118    BSA (Calculated) 1.62 filed at... 09/04/2012 1118        PHYSICAL EXAMINATION:  ECOG PS 0 - Fully active, able to carry on all pre-disease performance without restriction.     Consitutional:  No acute distress.  Non-Toxic.   Eyes: EOMI. No discharge. No Jaundice.   ENT: Moist mucous membranes. No posterior pharynx lesions.   Heme/ Lymph: No Cervical,  Inguinal, axillary lymph nodes. No Bruising.   Cardiovascular:  Tachycardic S1, S2,  No murmur.  Respiratory: Normal  Breath Sounds,   No rhonchi, crackles, or wheezing.  Abdomen: hypoactive BS.  Well healed midline scar.   Diffuse tenderness.  No rebound.   MS: No pitting/trace edema.  Skin: Normal turgor. No Rashes,skin  lesions  Psychiatry: Normal Affect. Intact memory. Cooperative.   Neuro: No focal deficits. Alert and Oriented x 3      LABORATORY:  Results for orders placed during the hospital encounter of 09/04/12 (from the past 36 hour(s))   ALK PHOS (ALKALINE PHOSPHATASE)       Result Value Range  ALKALINE PHOSPHATASE 85  <150 U/L   ALT (SGPT)       Result Value Range    ALT (SGPT) 6  <55 U/L   AST (SGOT)       Result Value Range    AST (SGOT) 17  8 - 45 U/L   BILIRUBIN TOTAL       Result Value Range    BILIRUBIN, TOTAL 0.3  0.3 - 1.3 mg/dL   BLOOD CELL COUNT W/DIFF - CANCER CENTER       Result Value Range    WBC 6.4  3.5 - 11.0 THOU/uL    RBC 3.64  3.63 - 4.92 MIL/uL    HGB 10.0 (*) 11.2 - 15.2 g/dL    HCT 16.1 (*) 09.6 - 45.2 %    MCV 82.5  78 - 100 fL    MCH 27.4  27.4 - 33.0 pg    MCHC 33.2  32.5 - 35.8 g/dL    RDW 04.5  40.9 - 81.1 %    PLATELET COUNT 119 (*) 140 - 450 THOU/uL    MPV 8.6  7.5 - 11.5 fL    PMN'S 63      PMN ABS 4.069  1.5 - 7.7 THOU/uL    LYMPHOCYTES 20      LYMPHS ABS 1.245  1.0 - 4.8 THOU/uL    MONOCYTES 13      MONOS ABS 0.808  0.3 - 1.0 THOU/uL    EOSINOPHIL 3      EOS ABS 0.181  0.0 - 0.5 THOU/uL    BASOPHILS 1      BASOS ABS 0.073  0.0 - 0.2 THOU/uL    PMN ABS 4.069  1.5 - 7.7 THOU/uL    PLATELET COUNT 119 (*) 140 - 450 THOU/uL   CREATININE       Result Value Range    CREATININE 0.70  0.49 - 1.10 mg/dL    ESTIMATED GLOMERULAR FILTRATION RATE >59  >59 ml/min/1.24m2   LDH       Result Value Range    LDH 174  125 - 220 U/L       IMAGING:  I have visualized the imaging studies I reviewed the findings with the patient.    PET CT 09/04/12  CT findings are as integrated with PET findings.   IMPRESSION:   1. Right upper lobe pulmonary nodule question metastatic disease.   2. Mediastinal hilar adenopathy which may represent malignancy.   3. Upper abdominal area with multiple surgical clips consistent with prior surgical procedure.    4. Right neck lymph node likely reactive but malignancy cannot be excluded.     ASSESSMENT:   1.  37 yo female with T3N0Mx gastric cancer.  She is s/p distal gastrectomy and D2 lymphadenectomy.  Tumor size 4.2 x 3.5 x 3.0 cm.  Adenocarcinoma, intestinal type.  Gr 2 (moderately differentiated)  PT3N0Mx. Her-2 negative by IHC and FISH.   She returns today for PET CT and treatment planning.    2. PMH of chronic pain, sarcoidosis, multiple abdominal surgeries, and possible crohn's disease  3.  Pain related to malignancy  4.  Mediastinal hilar adenopathy and RUL nodule on PET CT  4.  Family history of multiple cancers      PLAN:   Gastric Cancer  - PET CT reviewed with patient discussed hilar adenopathy and lung nodule uncertain if this is malignancy vs sarcoidosis   - will refer to pulmonary thoracic oncology clinic for evaluation and  possible biopsy, appointment arranged for tomorrow  09/05/12.   - recommended adjuvant chemotherapy for any T stage with N1 disease (which would include T1N1 stage IB) and for patients with T3N0 (stage IIA) disease. Meta-analyses provide support for a survival benefit from adjuvant chemotherapy following complete resection of gastric cancer as in the case of this patient with T3N0 disease, although there is no consensus as to the best approach.    - If hilar adenopathy is non malignant,  Would benefit from chemoradiation.  American Intergroup trial (ZOX0960) demonstrated a significant survival benefit for chemoradiotherapy after complete resection. The INT0116 study established postoperative chemoradiotherapy as a standard approach for completely resected gastric cancer; however, the optimal regimen has not yet been established. In INT0116, the chemoradiotherapy component was given with bolus IV FU and leucovorin on days 1 through 4 and on the last three days of RT. However given the better tolerability and comparable efficacy of daily low-dose infusional FU in other gastrointestinal sites such as rectal cancer, most clinicians utilize continuous infusion FU (200 mg/m2 daily) during RT instead of bolus FU plus leucovorin, or based on the ARTIST trial, give daily oral capecitabine (825 mg/m2 twice daily five days per week on radiation days)  - she may benefit from chemotherapy alone if her hilar adenopathy is proven malignant.  - patient will be referred to local oncologist, Dr Octavio Graves Monday, apt arranged September 11, 2012 at 10:00am  - will follow with Dr Octavio Graves.  Follow up arranged 11/10/12 when she follows with surgical oncology and genetics.  - has appoitment with Genetics on 11/10/12 due to notable family history of cancers    Sarcoidosis/Hilar Adenopathy  - Referral to pulmonary thoracic oncology clinic for evaluation.     Pain related to malignancy  - refilled OxyContin 20 mg BID and Oxycodone 15 mg prn for breakthrough pain  - anticipate her pain to improve the further she is from her surgery    Migraines  - reports worse with flexeril.  Will give trial Zanaflex  - encouraged to establish with local PCP    Anxiety   - encouraged to establish with local PCP        Kari Baars MD, PharmD  Fellow, Section of Hematology and Oncology.      09/06/2012   I saw and examined the patient.  I reviewed the fellow's note.  I agree with the findings and plan of care as documented in the fellow's note.  Any exceptions/additions are edited/noted.    Threasa Alpha, MD 09/06/2012, 9:49 AM

## 2012-09-04 NOTE — Nurses Notes (Addendum)
11:40--Port accessed, labs drawn and sent for processing. This was pt's first time having port accessed. She was tearful and anxious but did fine. Port flushed per protocol, huber needle removed and bandage applied to site. MA escorted pt to exam room for Dr Wallis Bamberg. Kandis Mannan, RN 09/04/2012, 11:40 AM  12:20pm Pt seen at time of consultation- Dr. Oretha Milch and Dr. Wallis Bamberg consulting with pt. Pt and father requesting letter for Mr. Binford work to confirm he attended consult and provided. Pt informed of pulmonary appt at 08:00am on 09/05/12 and will print AVS on discharge. Pt desires to follow up at [facility] Hematology- Oncology [phone]. I attempted to call now to set up consultation but out until 1:15pm. I will have patient sign ROI for record release to [place]. Tereasa Coop  1:15pm Reviewed with patient chemo teaching in general as well as radiation therapy and provided the book Chemotherapy and You, as well as our [facility] chemo binder, and the ACS Radiation therapy - and reviewed all with patient and father. I confirmed our 8am appt with pulomonary in the am, as well as provided Rx for Oxycontin and oxycodone as written by Dr. Oretha Milch and pt to take to POC to obtain. Call placed again to [facility]- Dr. Dennison Nancy- pts requested physician- [phone] and appt scheduled for Monday, September 11, 2012 and confirmed at 10:00am. Pt familiar with office and location and relates " My cousin went there". Completed release of information faxed to Health Information Management to notify of records needed and patients upcoming appt on 09/11/12. Pt provided my contact information for questions or concerns. Case placed on multidisciplinary tumor board for 09/07/12 as per Dr Wallis Bamberg. I faxed latest progress note and demographics to Dr. Tobin Chad for scheduling purposes at [phone] and records will follow to Dr. Tobin Chad. Tereasa Coop

## 2012-09-05 ENCOUNTER — Encounter (HOSPITAL_BASED_OUTPATIENT_CLINIC_OR_DEPARTMENT_OTHER): Payer: Self-pay

## 2012-09-05 ENCOUNTER — Ambulatory Visit
Admission: RE | Admit: 2012-09-05 | Discharge: 2012-09-05 | Disposition: A | Discharge status: Home / Self Care | Payer: MEDICAID | Source: Ambulatory Visit

## 2012-09-05 VITALS — BP 128/80 | HR 102 | Temp 96.6°F | Resp 18 | Ht 61.0 in | Wt 134.5 lb

## 2012-09-05 DIAGNOSIS — Z87891 Personal history of nicotine dependence: Secondary | ICD-10-CM | POA: Insufficient documentation

## 2012-09-05 DIAGNOSIS — C169 Malignant neoplasm of stomach, unspecified: Secondary | ICD-10-CM | POA: Insufficient documentation

## 2012-09-05 DIAGNOSIS — Z903 Acquired absence of stomach [part of]: Secondary | ICD-10-CM | POA: Insufficient documentation

## 2012-09-05 DIAGNOSIS — R911 Solitary pulmonary nodule: Secondary | ICD-10-CM | POA: Insufficient documentation

## 2012-09-05 DIAGNOSIS — R599 Enlarged lymph nodes, unspecified: Secondary | ICD-10-CM | POA: Insufficient documentation

## 2012-09-05 MED ORDER — BUDESONIDE-FORMOTEROL HFA 160 MCG-4.5 MCG/ACTUATION AEROSOL INHALER
2.00 | INHALATION_SPRAY | Freq: Two times a day (BID) | RESPIRATORY_TRACT | Status: DC
Start: 2012-09-05 — End: 2015-10-08

## 2012-09-05 MED ORDER — ALBUTEROL SULFATE HFA 90 MCG/ACTUATION AEROSOL INHALER
1.0000 | INHALATION_SPRAY | Freq: Four times a day (QID) | RESPIRATORY_TRACT | Status: DC | PRN
Start: 2012-09-05 — End: 2015-10-08

## 2012-09-11 NOTE — Addendum Note (Signed)
Encounter addended by: Threasa Alpha, MD on: 09/11/2012  2:05 PM<BR>     Documentation filed: Clinical Notes, Care Teams

## 2012-09-11 NOTE — Cancer Center Note (Addendum)
[facility]                                [facility]                                    HEMATOLOGY/ONCOLOGY  Clinic Phone: [phone]  Office Phone: [phone]                                                                          PATIENT NAME:   Michele Mason, Michele Mason                   HOSPITAL GNFAOZ:[id number]  DATE OF SERVICE:09/05/2012  DATE OF BIRTH: 09-11-1975          REASON FOR VISIT:  Initial evaluation of mediastinal lymphadenopathy and right upper lobe pulmonary nodule.    HISTORY OF PRESENT ILLNESS:    The patient is a 37 year old female with a history of intermittent smoking and diagnosis of sarcoidosis who was recently diagnosed to have T3 N0 MX gastric carcinoma and is undergoing workup including staging PET/CT scan which showed mediastinal lymphadenopathy and a right upper lobe lung nodule.      According to the patient, she presented to [facility] in January 2014 with the complaints of upper abdominal pain associated with eating.  She also had a 20-pound weight loss accompanied by nausea and vomiting; therefore, the patient underwent an EGD and colonoscopy.  The EGD showed a mass in the antrum of the stomach which was biopsied and was highly suspicious for invasive adenocarcinoma of the stomach.  The patient then developed a partial bowel obstruction and underwent an exploratory laparotomy, lysis of adhesions and abdominal scar excision, distal gastrectomy and D2 lymphadenectomy on July 19, 2012. Pathology was consistent with moderately differentiated adenocarcinoma of the stomach.  Seven lymph nodes were examined, all of which were negative for malignancy. Patient underwent a PET/CT scan on September 04, 2012 which showed significant hypermetabolic foci in the chest predominantly in the right upper paratracheal lymph node, right lower anterior tracheal lymphadenopathy, subcarinal lymphadenopathy, AP window lymphadenopathy, right hilar lymphadenopathy, which were all PET positive.  The patient also had a right apical posterior nodule with an SUV of 2.4. Based on these findings, the patient was referred to our clinic for evaluation.       Patient is a former smoker, smoked intermittently for 14 years usually 0.1 pack per day.  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 Shewas treated with topical steroids and developed bowel obstruction secondary to intraabdominal lymphadenopathy requiring surgical procedure along with lysis adhesions and partial resection of the bowel at the age of 39. 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 The differential included sarcoidosis vs Crohn's disease and was treated with immunomodulators and steroids. Patient required another exploratory laparotomy 6 months later for recurrent adhesions leading to bowel obstruction.  Patient had been treated in [place] and subsequently in [place] and has  not been on treatment for the last 3 years.  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     Patient complains of chronic exertional dyspnea and dry cough which is usually exacerbated by seasonal changes.  No complaints of fevers or chills; however, she does complain of night sweats and has lost approximately 20 pounds in the last 4-6 months.  The patient also complains of intermittent chest pain and palpitations.  No complaints of nausea, vomiting, constipation, changes in bowel habits. No skin rash or photosensitivity, joint pain, joint stiffness or swelling has been reported.      REVIEW OF SYSTEMS:  Negative except as noted above.    PAST MEDICAL HISTORY:  1.  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  2.  Sarcoidosis.  3.  Questionable Crohn's disease.    PAST SURGICAL HISTORY:  1.  Exploratory laparotomy for lysis of adhesions at the age of 12 years.   2.  Reexploration with laparotomy with lysis of adhesions 6 months after the first procedure at the age of 12 years.  3.  Anxiety disorder.  4.  Insertion of a right IJ port for chemotherapy access done in February 2014.  5.  Exploratory laparotomy, lysis of adhesions, prior abdominal scar excision, Distal gastrectomy, D2 lymphadenectomy and umbilical hernia repair done in February 2014.    SOCIAL HISTORY:   The patient is single and lives in [place]. The patient is a former smoker who intermittently smoked socially, 0.1 pack per day since the age of 17 years.  She quit smoking around 8-10 months ago.  The patient also intermittently drinks alcohol on social occasions. The patient has been involved in a nursing degree program. Currently, she is out of school due to the diagnosis as she is actively undergoing treatment. 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     FAMILY HISTORY:    Mother has sarcoidosis and hepatitis B.  Father had CAD.  Paternal aunt had breast cancer.  Paternal grandmother had brain tumor and paternal grandfather had colon cancer.    MEDICATIONS:  1.  Tenormin 50 mg once a day.  2.  Flexeril 15 mg 3 times a day as needed.    3.  Milk of magnesia 30 mg every night.  4.  Reglan 10 mg 3 times a day.  5.  OxyContin 20 mg every 12 hours.  6.  Oxycodone 15 mg 1-2 tablets every 4 hours as needed for pain.  7.  Zantac 300 mg every evening..    8.  Senokot-S 1 tablet twice a day.  9.  Zanaflex 4 mg tablet 3 times daily as needed.    ALLERGIES:  No known drug allergies.     OBJECTIVE:  Vitals:  Temperature 35.9 degrees Celsius, heart rate 102, respirations 18 per minute, blood pressure 128/80, height 154.9 cm (5 feet 1 inch), weight 161 kg/134 pounds.  General physical examination: Young female sitting comfortably, does not appear to be in distress.  Eyes:  Pupils equal, round and reactive to light.  No conjunctival pallor or scleral icterus noted.  Oral cavity:  Oral mucosa appears moist, no oral thrush or pharyngeal exudates noted.  Neck:  No anterior or posterior cervical lymphadenopathy noted bilaterally.  No supraclavicular lymphadenopathy present.  Chest:  Clear to auscultation bilaterally, no audible  wheezes or rhonchi.  Cardiovascular:  Regular rate and rhythm, S1 and S2 audible with no murmurs.  Abdomen:  Surgical scar appears to be healing well.  No erythema or discharge around the surgical incision site was present. Abdomen soft, nontender, bowel sounds audible.  Extremities:  No cyanosis, clubbing or pedal edema was noted.  CNS:  Grossly intact.    INVESTIGATIONS:    PET/CT scan done on September 04, 2012.  Findings:    Significant hypermetabolic foci noted in the chest as follows:  1.  Right paratracheal lymphadenopathy with prominent node measuring 1.6 x 1.2 with an SUV of 5.  2.  Right lower lobe anterior tracheal adenopathy measuring 1.4 x 0.8 with an SUV of 6.3.  This is in continuity with the upper right paratracheal adenopathy.  3.  Subcarinal adenopathy with 2.1 x 1.2 cm with a 5.4 SUV.    4.  Aortopulmonary window lymph node measuring 1.8 x 1.6 cm with an SUV of 5.3.  5.  Right hilar lymph node measuring 1.8 x 1.2 cm with an SUV of 4.9 with left hilar lymph node of 1.9 x 0.8 with an SUV of 3.8.  6.  Right apical posterior nodule measuring 0.9 x 0.7 cm with an SUV of 2.4.  7.  All mediastinum and hilar lymph nodes would be suspicious for malignancy.   8.  There are no infiltrates, no pleural plaques, nodules or pleural effusion noted otherwise.     9.  There are no significant abnormalities associated with the heart.  10.  Upper abdominal area with multiple surgical clips consistent with prior surgical procedure.  11.  Right iliac lymph node likely reactive though malignancy cannot be excluded.      ASSESSMENT:  37 years old female with the history of smoking, sarcoidosis and recently diagnosed adenocarcinoma of stomach, S/P resection who was found to have PET positive mediastinal lymphadenopathy. 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   Given the lack of tumor involvement in the intra abdominal lymph nodes, it seems less likely that the tumor would spread directly into the mediastinum without involving the surrounding lymph nodes of the abdomen. It would be imperative to compare with the prior images done at [facility].    PLAN:  1. Oncology clinic nurses have gotten the consent for authorization of release of medical information which is to be faxed to [facility] to get the records.   2. Get pulmonary function testing including spirometry, lung volumes and diffusion capacity.  3. Use albuterol 1-2 puffs every 4-6 hours as needed.    Followup.  The patient will return to the clinic for followup in 4-6 weeks' time.      Iona Coach, MD  Fellow  [facility] Department of Medicine, Section of Pulmonary and Critical Care    Octavio Graves, MD  Assistant Professor, Section of Pulmonary and Critical Care  [place] Department of Medicine    [id number]; D: 09/10/2012 20:19:00; T: 09/11/2012 00:49:04            I saw and examined the patient.  I reviewed the resident's note.  I agree with the findings and plan of care as documented in the resident's note.  Any exceptions/additions are edited/noted.    Octavio Graves, MD 09/20/2012, 1:33 PM

## 2012-09-11 NOTE — H&P (Deleted)
[facility]                              DEPARTMENT OF MEDICINE                                [place], [place] [address]                                PATIENT NAME: Michele Mason, Michele Mason [facility] GNFAOZ:[id number]  DATE OF SERVICE:09/05/2012  DATE OF BIRTH: Mar 12, 1976    HISTORY AND PHYSICAL    REASON FOR VISIT:  Initial evaluation for mediastinal lymphadenopathy and right upper lobe pulmonary nodule.     HISTORY OF PRESENT ILLNESS:  The patient is a 37 year old female with a history of intermittent smoking and diagnosis of sarcoidosis who was recently diagnosed to have T3 N0 MX gastric carcinoma and is undergoing workup including staging PET/CT scan which showed mediastinal lymphadenopathy and a right upper lobe lung nodule.  Therefore, the patient was referred for further diagnostic evaluation to assess the staging of the disease.  According to the patient, she presented to [facility] in January 2014 with the complaints of upper abdominal pain usually associated with eating.  She also had an associated 20-pound weight loss during that time accompanied by nausea and vomiting, therefore, the patient underwent an EGD and colonoscopy.  The EGD showed a mass which was in the antrum of the stomach which was biopsied and was highly suspicious for adenocarcinoma of the stomach.  The patient developed a partial bowel obstruction.  She was then referred to [facility] for further evaluation and underwent an exploratory laparotomy, along with lysis of adhesions and prior abdominal scar excision, distal gastrostomy and T2 lymphadenectomy on July 20, 2011, which was consistent with moderately differentiated adenocarcinoma of the stomach and 7 lymph nodes were examined, all of which were negative for malignancy.  Following this, the patient underwent a PET/CT scan September 04, 2012, which showed significant hypermetabolic foci in the chest predominantly in the right upper tracheal lymph node, right lower lobe continued tracheal lymphadenopathy, subcarinal lymphadenopathy, AP window lymphadenopathy, right hilar lymphadenopathy which were all PET positive.  The patient also had a right apical posterior nodule with an SUV of 2.4; however, this is a borderline SUV given the patient's history of malignancy and was very concerning for metastatic disease.  All the mediastinal and hilar lymph node were also suspicious  for malignancy.  The upper abdominal area showed multiple surgical clips, which were consistent with surgical clips *** likely were reactive with malignancy but not be excluded.  Based on these findings, the patient was referred to our clinic for evaluation.  The patient has been a form smoker.  She has smoked intermittently for 14 years but usually *** 1-1.2 packs per.  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  Following this, the patient was treated for it and subsequently developed bowel obstruction secondary to intraabdominal lymphadenopathy requiring surgical procedure along with lysis adhesions and partial resection of the bowel over more than 10 years ago.  According to the patient, the pathology of the lymph nodes was consistent with lung disease *** was highly concerning for sarcoidosis; however, the patient was given a diagnosis of Crohn's and  was treated with immunomodulator and steroids for several years.  It is notable that she required another abdominal exploratory laparotomy 6 months later.  She had recurrence of adhesions leading to bowel obstruction.  Since then, the patient has been treated in [place] and subsequently in [place] and has not been on any treatment for the last 3 years.  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  The patient reports chronic exertional dyspnea and dry cough which is usually exacerbated by seasonal changes.  No complaints of fevers, chills; however, she does complain of night sweats and has lost approximately 20 pounds in the last 4-6 months.  The patient also complains of intermittent chest pain and palpitations.  No complaints of nausea and vomiting currently.  Her abdominal pain is well controlled with pain medicines.  No symptoms of constipation or *** .  No  skin rash has been reported and no ***  reported.  No joint pain or *** pain reported.      REVIEW OF SYSTEMS:  Negative except as noted above.    PAST MEDICAL HISTORY:  1.  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  2.  Sarcoidosis.  3.  Questionable Crohn's disease.    PAST SURGICAL HISTORY:  1.  Exploratory laparotomy for abdominal adhesions at the age of 12 years.  2.  Reexploration with laparotomy with lysis of adhesions 6 months after the first procedure at the age of 12 years.  3.  Anxiety disorder.  4.  Insertion of a right IJ port for chemotherapy access done in February 2014.  5.  Exploratory laparotomy, lysis of adhesions, prior abdominal scar excision.  6.  Distal gastrectomy.   7.  *** DP lymphadenectomy and umbilical hernia repair done in February 2014.    SOCIAL HISTORY:  The patient is single.  She lives in [place].  The patient is a former smoker who intermittently *** 1 pack per day since the age of 17 years.  She completely quit smoking around 8-10 months ago.  The patient also only intermittently drinks alcohol on social occasions.      OCCUPATIONAL HISTORY:    1.  The patient is involved in a nursing degree program.  Currently she is out of school since her diagnosis and she is actively undergoing treatment.  2.  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    FAMILY HISTORY:  Mother has sarcoidosis and hepatitis B.  Father had CAD.  Paternal aunt had breast cancer.  Paternal grandfather had a brain tumor and paternal grandmother had brain tumor and paternal grandfather had colon cancer.    MEDICATIONS:  1.  Tenormin 50 mg once a day.  2.  Flexeril 15 mg 3 times a day as needed.    3.  Milk of magnesia 30 mg every night.  4.  Reglan 10 mg 3 times a day.  5.  OxyContin 20 mg every 12 hours.  6.  Oxycodone 15 mg 1-2 tablets every 4 hours as needed for pain.  7.  Zantac 300 mg every evening..     8.  Senokot-S 1 tablet twice a day.  9.  VeriFLEX 4 mg tablet 3 times daily as needed.    ALLERGIES:  No known drug allergies.    OBJECTIVE:  Vitals:  Temperature 35.9 degrees Celsius, heart rate 102, respirations 18 per minute, blood pressure 128/80, height 154.9 cm (5 feet 1 inch), weight 161 kg/134 pounds.  General physical examination:  A *** young female sitting comfortably, does not appear to be in distress.  Eyes:  Pupils equal, round and reactive to light.  No conjunctival pallor or scleral icterus noted.  Oral cavity:  Oral mucosa appears moist, no oral thrush or pharyngeal exudates noted.  Neck:  No anterior and posterior cervical lymphadenopathy noted bilaterally.  No supraclavicular lymphadenopathy present.  Chest:  Clear to auscultation bilaterally, no audible wheezes or rhonchi.  Cardiovascular:  Regular rate and rhythm, S1 and S2 audible with no murmurs.  Abdomen:  Surgical scar mark was noted and it appears to be healing well.  No erythema or discharge around the surgical incision site was present.  Abdomen:  Soft, nontender, bowel sounds audible.  Extremities:  No cyanosis, clubbing or pedal edema was noted.  CNS:  Grossly intact.    INVESTIGATIONS:  PET/CT scan done on September 04, 2012.    FINDINGS:  Significant hypermetabolic foci noted in the chest as follows:  1.  Right paratracheal lymphadenopathy with prominent node measuring 1.6 x 1.2 with an SUV of 5.  2.  Right lower lobe anterior tracheal adenopathy measuring 1.4 x 0.8 with an SUV of 6.3.  This is in continuity with the upper right paratracheal adenopathy.  3.  Subcarinal adenopathy with 2.1 x 1.2 cm with a 5.4 SUV.    4.  Aortopulmonary window lymph node measuring 1.8 x 1.6 cm with an SUV of 5.3.  5.  Right hilar lymph node measuring 1.8 x 1.2 cm with an SUV of 4.9 with left hilar lymph node of 1.9 x 0.8 with an SUV of 3.8.  6.  Right apical posterior nodule measuring 1 x 0.7 cm with an SUV of 2.4.   7.  All mediastinum and hilar lymph nodes *** should to be suspicious for malignancy.  8.  There are no infiltrations of *** nodules or pleural effusion noted otherwise.    9.  There are no significant abnormalities associated with the heart.  10.  Upper abdominal area with multiple surgical clips consistent with prior surgical procedure.  11.  Right iliac lymph node likely reactive with malignancy cannot be excluded.   As the patient had chest imaging done in [facility], it would be imperative to review those imaging of the chest with the current one so as to assess for the duration of the mediastinal lymphadenopathy finding.  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  Therefore, we will give the patient consent and release of information form so as to get the *** and CT imaging performed at [facility] for reassessment.  12.  Will get pulmonary function testing including spirometry, lung volumes and diffusion capacity.  13.  In the meantime, the patient was prescribed albuterol 1-2 puffs every 4-6 hours as needed.  14.  Followup.  The patient will return to the clinic for followup in 4-6 weeks' time.    Octavio Graves MD  Iona Coach, MD  Fellow  [facility] Department of Medicine, Section of Pulmonary and Critical Care    [id number]; D: 09/10/2012 20:19:00; T: 09/11/2012 00:49:04

## 2012-09-26 ENCOUNTER — Other Ambulatory Visit (HOSPITAL_COMMUNITY): Payer: Self-pay

## 2012-09-26 ENCOUNTER — Encounter (HOSPITAL_BASED_OUTPATIENT_CLINIC_OR_DEPARTMENT_OTHER): Payer: Self-pay

## 2012-09-26 NOTE — Progress Notes (Signed)
Late entry:   MSW provided pt with financial assistance for treatment related transportation costs through the [employer]; MSW provided pt with a gas card on 09/05/2012.    Gwyndolyn Saxon, MSW

## 2012-10-03 ENCOUNTER — Telehealth (HOSPITAL_BASED_OUTPATIENT_CLINIC_OR_DEPARTMENT_OTHER): Payer: Self-pay | Admitting: Hematology & Oncology

## 2012-10-03 NOTE — Telephone Encounter (Signed)
I had telephone conversation and also received letters from Dr. Dennison Nancy ( medical oncologist) and  Dr. Molli Barrows ( radiation oncologist) in [place] with their treatment plans to offer adjuvant 5 FU /leucovorin and concurrent chemoradiation with infusional 5-FU. Patient agreed to plan of care and had planning CT done on April 8th.

## 2012-10-03 NOTE — Telephone Encounter (Addendum)
Received a call from call center asking for nurse to speak to this pt.  Pt was sobbing and very upset.  Stated that she has been seeing a "chemo Dr in [place]", that she does not like and refuses to give pt chemo.  Stated that they "made me sign a paper saying that there was a 90% chance that my lung was going to collapse" .  Pt stated that she "super stressed, weak, tired and keeps falling ".  Local Dr is aware of these symptoms and has ordered a CT scan. Pt also stated that she is so stressed out that she "wants to get a gun and shoot myself in the head".  Pt stated that she is "not psychotic and just has had enough".  Reccommended that pt go to local ER due to suicidal thoughts.  Pt refused and stated that she is "just upset and I have some Ativan that I can take, but I have not taken any in the past two days".   Attempted to calm pt, suggested that pt take an ativan now and reassure her that we can assess her plan of care with Dr. Wallis Bamberg.  Pt requesting to see Dr. Wallis Bamberg  ASAP so she can "get treatment and get my life started".  Message to be sent to Dr. Wallis Bamberg and [facility]  Sinclair Grooms RN

## 2012-10-03 NOTE — Telephone Encounter (Signed)
Pt calling back again stating that someone tried to call her and she missed the call.  No documentation in notes that anyone called her back today.  Talked to Dr, Wallis Bamberg and Nicholes Mango, RNC and they stated maybe Kendal Hymen was trying to call her back.  I spoke to [place] and she stated she would call her back.

## 2012-10-03 NOTE — Telephone Encounter (Signed)
Message copied by Joellyn Rued on Tue Oct 03, 2012  3:15 PM  ------       Message from: Julaine Fusi       Created: Tue Oct 03, 2012  2:46 PM         >> Julaine Fusi 10/03/2012 02:46 PM       Kur[place] pt              Pt states she missed a call from our office. Please call her back.                Thank you.   ------

## 2012-10-04 ENCOUNTER — Other Ambulatory Visit (HOSPITAL_BASED_OUTPATIENT_CLINIC_OR_DEPARTMENT_OTHER): Payer: Self-pay | Admitting: Hematology & Oncology

## 2012-10-04 NOTE — Progress Notes (Signed)
Patient very anxious and distraught and she has not started the chemoradiation treatment plans proposed by her medical oncologist at [facility]. We offered supportive care appointment here this week for her symptom management, but patient declined. I also talked to my colleague Dr. Rosette Reveal about adjuvant plan of care recommended we recommended and he agreed to see the patient for second opinion next week when she comes for her pulmonary appointment here on 4/29 to confirm the plan of care.

## 2012-10-05 ENCOUNTER — Encounter (HOSPITAL_BASED_OUTPATIENT_CLINIC_OR_DEPARTMENT_OTHER): Payer: Self-pay | Admitting: Hematology & Oncology

## 2012-10-05 ENCOUNTER — Ambulatory Visit (HOSPITAL_BASED_OUTPATIENT_CLINIC_OR_DEPARTMENT_OTHER): Payer: Self-pay | Admitting: Hematology & Oncology

## 2012-10-05 NOTE — Telephone Encounter (Signed)
 Message from call center from radiology that CT must be rescheduled due to pending insurance approval, explained that once the CT was rescheduled the [facility] appts would be rescheduled accordingly. A.Sergey Ishler RN

## 2012-10-05 NOTE — Progress Notes (Signed)
I called patient to follow up - related to patients scheduled appointments with medical oncology on 10/10/12. I confirmed all appointments for next week and patient relates well. Nicholes Mango, RN

## 2012-10-10 ENCOUNTER — Other Ambulatory Visit (HOSPITAL_COMMUNITY): Payer: Self-pay

## 2012-10-10 ENCOUNTER — Ambulatory Visit (HOSPITAL_BASED_OUTPATIENT_CLINIC_OR_DEPARTMENT_OTHER)
Admission: RE | Admit: 2012-10-10 | Discharge: 2012-10-10 | Disposition: A | Discharge status: Home / Self Care | Payer: MEDICAID | Source: Ambulatory Visit | Admitting: Hematology & Oncology

## 2012-10-10 ENCOUNTER — Ambulatory Visit (HOSPITAL_BASED_OUTPATIENT_CLINIC_OR_DEPARTMENT_OTHER)
Admission: RE | Admit: 2012-10-10 | Discharge: 2012-10-10 | Disposition: A | Discharge status: Home / Self Care | Payer: MEDICAID | Source: Ambulatory Visit

## 2012-10-10 ENCOUNTER — Ambulatory Visit
Admission: RE | Admit: 2012-10-10 | Discharge: 2012-10-10 | Disposition: A | Discharge status: Home / Self Care | Payer: MEDICAID | Source: Ambulatory Visit | Attending: Hematology & Oncology | Admitting: Hematology & Oncology

## 2012-10-10 ENCOUNTER — Encounter (HOSPITAL_BASED_OUTPATIENT_CLINIC_OR_DEPARTMENT_OTHER): Payer: Self-pay

## 2012-10-10 VITALS — BP 128/76 | HR 90 | Temp 99.3°F | Resp 18 | Ht 61.5 in | Wt 137.8 lb

## 2012-10-10 DIAGNOSIS — R911 Solitary pulmonary nodule: Secondary | ICD-10-CM | POA: Insufficient documentation

## 2012-10-10 DIAGNOSIS — R599 Enlarged lymph nodes, unspecified: Secondary | ICD-10-CM | POA: Insufficient documentation

## 2012-10-10 DIAGNOSIS — K5090 Crohn's disease, unspecified, without complications: Secondary | ICD-10-CM | POA: Diagnosis present

## 2012-10-10 DIAGNOSIS — D869 Sarcoidosis, unspecified: Secondary | ICD-10-CM | POA: Insufficient documentation

## 2012-10-10 DIAGNOSIS — C169 Malignant neoplasm of stomach, unspecified: Secondary | ICD-10-CM | POA: Diagnosis present

## 2012-10-10 DIAGNOSIS — Z903 Acquired absence of stomach [part of]: Secondary | ICD-10-CM | POA: Insufficient documentation

## 2012-10-10 DIAGNOSIS — Z5111 Encounter for antineoplastic chemotherapy: Secondary | ICD-10-CM | POA: Diagnosis present

## 2012-10-10 DIAGNOSIS — Z87891 Personal history of nicotine dependence: Secondary | ICD-10-CM | POA: Insufficient documentation

## 2012-10-10 DIAGNOSIS — C163 Malignant neoplasm of pyloric antrum: Secondary | ICD-10-CM | POA: Insufficient documentation

## 2012-10-10 DIAGNOSIS — I10 Essential (primary) hypertension: Secondary | ICD-10-CM | POA: Insufficient documentation

## 2012-10-10 DIAGNOSIS — D62 Acute posthemorrhagic anemia: Secondary | ICD-10-CM | POA: Diagnosis present

## 2012-10-10 DIAGNOSIS — L7682 Other postprocedural complications of skin and subcutaneous tissue: Secondary | ICD-10-CM | POA: Diagnosis present

## 2012-10-10 LAB — CARCINOEMBRYONIC ANTIGEN: CARCINOEMBRYONIC AG: 1 ng/mL (ref <5.0)

## 2012-10-10 LAB — BLOOD CELL COUNT W/DIFF - CANCER CENTER
BASOPHILS: 1 %
BASOS ABS: 0.044 THOU/uL (ref 0.0–0.2)
EOS ABS: 0.145 THOU/uL (ref 0.0–0.5)
EOSINOPHIL: 2 %
HCT: 29.5 % — ABNORMAL LOW (ref 33.5–45.2)
HGB: 9.3 g/dL — ABNORMAL LOW (ref 11.2–15.2)
LYMPHOCYTES: 20 %
LYMPHS ABS: 1.518 THOU/uL (ref 1.0–4.8)
MCH: 24.8 pg — ABNORMAL LOW (ref 27.4–33.0)
MCHC: 31.5 g/dL — ABNORMAL LOW (ref 32.5–35.8)
MCV: 78.7 fL (ref 78–100)
MONOCYTES: 10 %
MONOS ABS: 0.735 10*3/uL (ref 0.3–1.0)
MPV: 8.1 fL (ref 7.5–11.5)
PLATELET COUNT (AUTO): 131 THOU/uL — ABNORMAL LOW (ref 140–450)
PLATELET COUNT: 131 THOU/uL — ABNORMAL LOW (ref 140–450)
PMN ABS (AUTO): 5.226 10*3/uL (ref 1.5–7.7)
PMN ABS: 5.226 THOU/uL (ref 1.5–7.7)
PMN'S: 67 %
RBC: 3.74 MIL/uL (ref 3.63–4.92)
RDW: 15.2 % — ABNORMAL HIGH (ref 12.0–15.0)
WBC: 7.7 THOU/uL (ref 3.5–11.0)

## 2012-10-10 LAB — ALK PHOS (ALKALINE PHOSPHATASE): ALKALINE PHOSPHATASE: 93 U/L (ref <150)

## 2012-10-10 LAB — AST (SGOT): AST (SGOT): 20 U/L (ref 8–45)

## 2012-10-10 LAB — LDH: LDH: 161 U/L (ref 125–220)

## 2012-10-10 LAB — ALT (SGPT): ALT (SGPT): 5 U/L (ref <55)

## 2012-10-10 LAB — BILIRUBIN TOTAL: BILIRUBIN, TOTAL: 0.3 mg/dL (ref 0.3–1.3)

## 2012-10-10 LAB — CREATININE: ESTIMATED GLOMERULAR FILTRATION RATE: >59 mL/min/{1.73_m2} (ref >59)

## 2012-10-10 LAB — CREATININE WITH EGFR: CREATININE: 0.81 mg/dL (ref 0.49–1.10)

## 2012-10-10 MED ORDER — ALBUTEROL SULFATE 2.5 MG/3 ML (0.083 %) SOLUTION FOR NEBULIZATION
2.5000 mg | INHALATION_SOLUTION | Freq: Once | RESPIRATORY_TRACT | Status: AC
Start: 2012-10-10 — End: 2012-10-10

## 2012-10-10 MED ORDER — ALBUTEROL SULFATE 2.5 MG/3 ML (0.083 %) SOLUTION FOR NEBULIZATION
INHALATION_SOLUTION | RESPIRATORY_TRACT | Status: AC
Start: 2012-10-10 — End: 2012-10-10
  Filled 2012-10-10: qty 3

## 2012-10-10 MED ADMIN — albuterol sulfate 2.5 mg/3 mL (0.083 %) solution for nebulization: 2.5 mg | RESPIRATORY_TRACT | NDC 00487950101

## 2012-10-10 NOTE — Cancer Center Note (Addendum)
 [facility]                                                                                                                                    [facility]                                                                                                                                                    PULMONARY SPECIALS    Clinic Phone: [phone]   Office Phone: [phone]     PATIENT NAME: Michele Mason, Michele Mason   HOSPITAL WLFAZM:[id number]   DATE OF SERVICE: 10/10/2012  DATE OF BIRTH: 1975-09-19     REASON FOR VISIT: Initial evaluation of mediastinal lymphadenopathy and right upper lobe pulmonary nodule.       HISTORY OF PRESENT ILLNESS:   The patient is a 37 year old female with a history of intermittent smoking and diagnosis of sarcoidosis who was recently diagnosed to have T3 N0 MX gastric carcinoma and is undergoing workup including staging PET/CT scan which showed mediastinal lymphadenopathy and a right upper lobe lung nodule.   According to the patient, she presented to [facility] in January 2014 with the complaints of upper abdominal pain associated with eating. She also had a 20-pound weight loss accompanied by nausea and vomiting; therefore, the patient underwent an EGD and colonoscopy. The EGD showed a mass in the antrum of the stomach which was biopsied and was highly suspicious for invasive adenocarcinoma of the stomach. The patient then developed a partial bowel obstruction and underwent an exploratory laparotomy, lysis of adhesions and abdominal scar excision, distal gastrectomy and D2 lymphadenectomy on July 19, 2012. Pathology was consistent with moderately differentiated adenocarcinoma of the stomach. Seven lymph nodes were examined, all of which were negative for malignancy. Patient underwent a PET/CT scan on September 04, 2012 which showed significant hypermetabolic foci in the chest predominantly in the right upper paratracheal lymph node, right lower anterior tracheal lymphadenopathy, subcarinal lymphadenopathy, AP window lymphadenopathy, right hilar lymphadenopathy, which were all PET positive. The patient also had a right apical posterior nodule with an SUV of 2.4. Based on these findings, the patient was referred to our clinic for evaluation.   Patient is a former smoker, smoked intermittently  for 14 years usually 0.1 pack per day. 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 Shewas treated with topical steroids and developed bowel obstruction secondary to intraabdominal lymphadenopathy requiring surgical procedure along with lysis adhesions and partial resection of the bowel at the age of 40. 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 The differential included sarcoidosis vs Crohn's disease and was treated with immunomodulators and steroids. Patient required another exploratory laparotomy 6 months later for recurrent adhesions leading to bowel obstruction. Patient had been treated in [place]  and subsequently in [place]  and has not been on treatment for the last 3 years. 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    No complaints of fevers or chills; however, she does complain of night sweats and has lost approximately 20 pounds in the last 4-6 months. The patient also complains of intermittent chest pain and palpitations. No complaints of nausea, vomiting, constipation, changes in bowel habits. No skin rash or photosensitivity, joint pain, joint stiffness or swelling has been reported.  No old CT scan of the chest was available from [place] . She denied any  significant symptoms    REVIEW OF SYSTEMS: Negative except as noted above.     PAST MEDICAL HISTORY:   1. 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   2. Sarcoidosis.   3. Questionable Crohn's disease.     PAST SURGICAL HISTORY:   1. Exploratory laparotomy for lysis of adhesions at the age of 12 years.   2. Reexploration with laparotomy with lysis of adhesions 6 months after the first procedure at the age of 12 years.   3. Anxiety disorder.   4. Insertion of a right IJ port for chemotherapy access done in February 2014.   5. Exploratory laparotomy, lysis of adhesions, prior abdominal scar excision, Distal gastrectomy, D2 lymphadenectomy and umbilical hernia repair done in February 2014.     SOCIAL HISTORY:   The patient is single and lives in [place] . The patient is a former smoker who intermittently smoked socially, 0.1 pack per day since the age of 17 years. She quit smoking around 8-10 months ago. The patient also intermittently drinks alcohol on social occasions. The patient has been involved in a nursing degree program. Currently, she is out of school due to the diagnosis as she is actively undergoing treatment. 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     FAMILY HISTORY:   Mother has sarcoidosis and hepatitis B. Father had CAD. Paternal aunt had breast cancer. Paternal grandmother had brain tumor and paternal grandfather had colon cancer.     MEDICATIONS:   1. Tenormin  50 mg once a day.   2. Flexeril  15 mg 3 times a day as needed.   3. Milk of magnesia 30 mg every night.   4. Reglan  10 mg 3 times a day.   5. OxyContin  20 mg every 12 hours.   6. Oxycodone  15 mg 1-2 tablets every 4 hours as needed for pain.   7. Zantac  300 mg every evening..   8. Senokot-S 1 tablet twice a day.   9. Zanaflex  4 mg tablet 3 times daily as needed.     ALLERGIES: No known drug allergies.  OBJECTIVE:     Vitals: BP 128/76  Pulse 90   Temp(Src) 37.4 C (99.3 F)  Resp 18  Ht 1.562 m (5' 1.5)  Wt 62.5 kg (137 lb 12.6 oz)  BMI 25.62 kg/m2. General physical examination: Young female sitting comfortably, does not appear to be in distress. Eyes: Pupils equal, round and reactive to light. No conjunctival pallor or scleral icterus noted. Oral cavity: Oral mucosa appears moist, no oral thrush or pharyngeal exudates noted. Neck: No anterior or posterior cervical lymphadenopathy noted bilaterally. No supraclavicular lymphadenopathy present. Chest: Clear to auscultation bilaterally, no audible wheezes or rhonchi. Cardiovascular: Regular rate and rhythm, S1 and S2 audible with no murmurs. Abdomen: Surgical scar appears to be healing well. No erythema or discharge around the surgical incision site was present. Abdomen soft, nontender, bowel sounds audible. Extremities: No cyanosis, clubbing or pedal edema was noted. CNS: Grossly intact.     INVESTIGATIONS:   PET/CT scan done on September 04, 2012.   Findings:   Significant hypermetabolic foci noted in the chest as follows:   1. Right paratracheal lymphadenopathy with prominent node measuring 1.6 x 1.2 with an SUV of 5.   2. Right lower lobe anterior tracheal adenopathy measuring 1.4 x 0.8 with an SUV of 6.3. This is in continuity with the upper right paratracheal adenopathy.   3. Subcarinal adenopathy with 2.1 x 1.2 cm with a 5.4 SUV.   4. Aortopulmonary window lymph node measuring 1.8 x 1.6 cm with an SUV of 5.3.   5. Right hilar lymph node measuring 1.8 x 1.2 cm with an SUV of 4.9 with left hilar lymph node of 1.9 x 0.8 with an SUV of 3.8.   6. Right apical posterior nodule measuring 0.9 x 0.7 cm with an SUV of 2.4.   7. All mediastinum and hilar lymph nodes would be suspicious for malignancy.   8. There are no infiltrates, no pleural plaques, nodules or pleural effusion noted otherwise.   9. There are no significant abnormalities associated with the heart.   10. Upper abdominal area with multiple surgical  clips consistent with prior surgical procedure.   11. Right iliac lymph node likely reactive though malignancy cannot be excluded.       PFTS:        ASSESSMENT:   37 years old female with the history of smoking, sarcoidosis and recently diagnosed adenocarcinoma of stomach, S/P resection who was found to have PET positive mediastinal lymphadenopathy. 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 Although the mediastinal lymphadenopathy is most likely related to Sarcoidosis malignancy cannot be completely excluded since no old scans CT Chest are available for comparison . We did explain to her that we would recommend for her to undergo EBUS Bronchoscopy with FNA of the mediastinal lymph nodes under general anesthesia for sampling of the lymph node. The risks and benefits of the procedure were discussed with the patient. Alternative options including watch full waiting with repeat CT Scan chest to assess stability would not be advisable in a young patient with recent diagnosis of adenocarcinoma of the stomach       PLAN:   1.  Schedule for EBUS Bronchoscopy for the mediastinal lymph nodes next week    2. Risks and benefits of procedure discussed with the patient ( Differential diagnosis discussed Sarcoidosis vs concern for malignancy )    3. Medications and labs reviewed on no blood thinning  medications    We would call the patient after the scheduling date is confirmed for the procedure    Patient was seen and examined jointly with Dr Feliz MD      Roetta Bathe MD  Fellow  Section of Pulmonary & Critical Care    I saw and examined the patient.  I reviewed the resident's note.  I agree with the findings and plan of care as documented in the resident's note.  Any exceptions/additions are edited/noted.    Dominique Feliz, MD 10/12/2012, 10:57 AM
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
## 2012-10-10 NOTE — Nurses Notes (Signed)
1620: Port not needed. Port flushed per protocol, deaccessed, band aid applied. Patient ambulatory by self upon discharge. AMayer, RN

## 2012-10-10 NOTE — Nurses Notes (Addendum)
 Pt admitted to VAD/Triage Area for  VS and labs. Port accessed per protocol, labs drawn and sent.Port left accessed in case pt needs more labs after her appointment. Awaiting results. Pt left VAD/Triage Area in stable condition. Pt to exam 26 to see Pulmonary. Pt also has an appointment with Dr Rebeca at 1 pm; however per pt she does not think she is to see anyone other than Pulmonary.  ETelfer,RN   1048- clarified with RAndria,RN,Clinician with Dr  Rebeca that pt is to see Dr Rebeca. Per Rochel PEAK pt requested a second opinion and Dr Rebeca agreed to come in for a patient appointment on a Tuesday that patient is to be here for Pulmonary. Per notes pt was called by PVlahakis,RN,Clinician with Dr  Nickolas on 4.24.14 and told of appointment with Dr Rebeca. Spoke with pt who states I did not realize that was who I was seeing. Pt asks  why is the appointment is so late?SABRA Explained that Dr Rebeca is on wards and is not available until 1 pm. Suggested that pt get lunch between the appointments. Pt states I am at the [facility] so I'll go back there and come at 1 pm.  Chet PEAK

## 2012-10-11 ENCOUNTER — Ambulatory Visit (HOSPITAL_BASED_OUTPATIENT_CLINIC_OR_DEPARTMENT_OTHER): Payer: Self-pay | Admitting: Critical Care Medicine

## 2012-10-11 NOTE — Telephone Encounter (Signed)
Message copied by Lucille Passy on Wed Oct 11, 2012 11:52 AM  ------       Message from: Gibson Ramp       Created: Wed Oct 11, 2012 11:29 AM         >> Gibson Ramp 10/11/2012 11:29 AM       Pulmonary specials clinic       The pt states that she is scheduled for surgery on 5.5.14, she needs to know the details concerning the surgery. She states that she is to start chemo in her area and needs to let the physicians know all information  ------

## 2012-10-11 NOTE — Telephone Encounter (Signed)
As Pulmonary is not in clinic at [facility] until 10/17/12 called to Pulmonary Specials. Could not get a nurse on the line. Put in message for them to call pt with details.  ETelfer,RN

## 2012-10-11 NOTE — Addendum Note (Signed)
Encounter addended by: Foley, Pamela on: 10/11/2012  2:24 PM<BR>     Documentation filed: Charges VN

## 2012-10-16 ENCOUNTER — Encounter (HOSPITAL_COMMUNITY): Payer: Self-pay

## 2012-10-16 ENCOUNTER — Other Ambulatory Visit (INDEPENDENT_AMBULATORY_CARE_PROVIDER_SITE_OTHER): Payer: Self-pay

## 2012-10-16 NOTE — Addendum Note (Signed)
Encounter addended by: Francoise Schaumann, MD on: 10/16/2012  9:05 AM<BR>     Documentation filed: Demographics Visit

## 2012-10-20 ENCOUNTER — Inpatient Hospital Stay
Admission: RE | Admit: 2012-10-20 | Discharge: 2012-10-20 | Disposition: A | Discharge status: Home / Self Care | Payer: MEDICAID | Source: Ambulatory Visit

## 2012-10-20 ENCOUNTER — Ambulatory Visit (HOSPITAL_BASED_OUTPATIENT_CLINIC_OR_DEPARTMENT_OTHER)
Admission: RE | Admit: 2012-10-20 | Discharge: 2012-10-20 | Disposition: A | Discharge status: Home / Self Care | Payer: MEDICAID | Source: Ambulatory Visit

## 2012-10-20 ENCOUNTER — Encounter (HOSPITAL_COMMUNITY): Payer: Self-pay | Admitting: Pain Medicine

## 2012-10-20 ENCOUNTER — Other Ambulatory Visit (HOSPITAL_BASED_OUTPATIENT_CLINIC_OR_DEPARTMENT_OTHER): Payer: Self-pay | Admitting: Critical Care Medicine

## 2012-10-20 ENCOUNTER — Ambulatory Visit (HOSPITAL_COMMUNITY)
Admission: RE | Admit: 2012-10-20 | Discharge: 2012-10-20 | Disposition: A | Discharge status: Home / Self Care | Payer: MEDICAID | Source: Ambulatory Visit

## 2012-10-20 ENCOUNTER — Ambulatory Visit (HOSPITAL_COMMUNITY): Payer: MEDICAID

## 2012-10-20 ENCOUNTER — Encounter (HOSPITAL_COMMUNITY): Payer: Self-pay

## 2012-10-20 ENCOUNTER — Encounter (HOSPITAL_COMMUNITY): Admission: RE | Disposition: A | Discharge status: Home / Self Care | Payer: Self-pay | Source: Ambulatory Visit

## 2012-10-20 ENCOUNTER — Ambulatory Visit (HOSPITAL_BASED_OUTPATIENT_CLINIC_OR_DEPARTMENT_OTHER): Payer: MEDICAID

## 2012-10-20 ENCOUNTER — Ambulatory Visit (HOSPITAL_BASED_OUTPATIENT_CLINIC_OR_DEPARTMENT_OTHER): Payer: MEDICAID | Admitting: Pain Medicine

## 2012-10-20 DIAGNOSIS — D869 Sarcoidosis, unspecified: Secondary | ICD-10-CM | POA: Insufficient documentation

## 2012-10-20 DIAGNOSIS — Z87891 Personal history of nicotine dependence: Secondary | ICD-10-CM | POA: Insufficient documentation

## 2012-10-20 DIAGNOSIS — C169 Malignant neoplasm of stomach, unspecified: Secondary | ICD-10-CM | POA: Insufficient documentation

## 2012-10-20 HISTORY — DX: Gastro-esophageal reflux disease without esophagitis: K21.9

## 2012-10-20 HISTORY — DX: Other skin changes: R23.8

## 2012-10-20 HISTORY — DX: Dorsopathy, unspecified: M53.9

## 2012-10-20 HISTORY — DX: Migraine, unspecified, not intractable, without status migrainosus: G43.909

## 2012-10-20 HISTORY — PX: PB BRONCHOSCOPY,DIAGNOSTIC: 31622

## 2012-10-20 HISTORY — DX: Essential (primary) hypertension: I10

## 2012-10-20 HISTORY — DX: Polyneuropathy, unspecified: G62.9

## 2012-10-20 HISTORY — DX: Cardiac arrhythmia, unspecified: I49.9

## 2012-10-20 HISTORY — DX: Immune thrombocytopenic purpura (CMS HCC): D69.3

## 2012-10-20 HISTORY — DX: Depression, unspecified: F32.A

## 2012-10-20 HISTORY — DX: Unspecified contact dermatitis, unspecified cause: L25.9

## 2012-10-20 HISTORY — DX: Cardiac murmur, unspecified: R01.1

## 2012-10-20 HISTORY — DX: Personal history of other medical treatment: Z92.89

## 2012-10-20 HISTORY — PX: ULTRASOUND ENDOSCOPIC RECTAL: WVUENDOPRO120

## 2012-10-20 HISTORY — DX: Encounter for ear piercing: Z41.3

## 2012-10-20 HISTORY — DX: Other specified disorders of pigmentation: L81.8

## 2012-10-20 HISTORY — DX: Spontaneous ecchymoses: R23.3

## 2012-10-20 HISTORY — DX: Other general symptoms and signs: R68.89

## 2012-10-20 LAB — CBC/DIFF
BASOPHILS: 1 %
BASOS ABS: 0.064 10*3/uL (ref 0.0–0.2)
EOS ABS: 0.13 THOU/uL (ref 0.0–0.5)
EOSINOPHIL: 2 %
HCT: 33.5 % (ref 33.5–45.2)
HGB: 10.4 g/dL — ABNORMAL LOW (ref 11.2–15.2)
LYMPHOCYTES: 17 %
LYMPHS ABS: 1.219 10*3/uL (ref 1.0–4.8)
MCH: 25 pg — ABNORMAL LOW (ref 27.4–33.0)
MCHC: 31 g/dL — ABNORMAL LOW (ref 32.5–35.8)
MCV: 80.4 fL (ref 78–100)
MONOCYTES: 10 %
MONOS ABS: 0.699 10*3/uL (ref 0.3–1.0)
MPV: 9.4 fL (ref 7.5–11.5)
PLATELET COUNT: 158 10*3/uL (ref 140–450)
PMN ABS: 5.236 10*3/uL (ref 1.5–7.7)
PMN'S: 70 %
RBC: 4.16 MIL/uL (ref 3.63–4.92)
RDW: 16.8 % — ABNORMAL HIGH (ref 12.0–15.0)
WBC: 7.3 THOU/uL (ref 3.5–11.0)

## 2012-10-20 LAB — PERFORM POC WHOLE BLOOD GLUCOSE: GLUCOSE, POINT OF CARE: 97 mg/dL (ref 70–105)

## 2012-10-20 LAB — PT/INR: INR: 0.99 (ref 0.80–1.20)

## 2012-10-20 LAB — PTT (PARTIAL THROMBOPLASTIN TIME): APTT: 43.9 s — ABNORMAL HIGH (ref 25.1–36.5)

## 2012-10-20 SURGERY — ULTRASOUND ENDOBRONCHIAL
Anesthesia: General | Site: Bronchus | Wound class: Clean Contaminated Wounds-The respiratory, GI, Genital, or urinary

## 2012-10-20 MED ORDER — ONDANSETRON HCL (PF) 4 MG/2 ML INJECTION SOLUTION
INTRAMUSCULAR | Status: AC
Start: 2012-10-20 — End: 2012-10-20
  Filled 2012-10-20: qty 2

## 2012-10-20 MED ORDER — PHENYLEPHRINE 0.5 MG/5 ML (100 MCG/ML)IN 0.9 % SOD.CHLORIDE IV SYRINGE
INJECTION | Freq: Once | INTRAVENOUS | Status: DC | PRN
Start: 2012-10-20 — End: 2012-10-20
  Administered 2012-10-20 (×6): 100 ug via INTRAVENOUS
  Administered 2012-10-20: 150 ug via INTRAVENOUS

## 2012-10-20 MED ORDER — REMIFENTANIL 50 MCG/ML INFUSION - FOR ANES
INTRAVENOUS | Status: DC | PRN
Start: 2012-10-20 — End: 2012-10-20
  Administered 2012-10-20: 0.1 ug/kg/min via INTRAVENOUS
  Administered 2012-10-20: 0 ug/kg/min via INTRAVENOUS

## 2012-10-20 MED ORDER — ONDANSETRON HCL (PF) 4 MG/2 ML INJECTION SOLUTION
4.0000 mg | Freq: Once | INTRAMUSCULAR | Status: AC
Start: 2012-10-20 — End: 2012-10-20
  Administered 2012-10-20: 4 mg via INTRAVENOUS

## 2012-10-20 MED ORDER — AZITHROMYCIN 250 MG TABLET
ORAL_TABLET | ORAL | Status: DC
Start: 2012-10-20 — End: 2012-11-10

## 2012-10-20 MED ORDER — MIDAZOLAM 1 MG/ML INJECTION SOLUTION
Freq: Once | INTRAMUSCULAR | Status: DC | PRN
Start: 2012-10-20 — End: 2012-10-20
  Administered 2012-10-20: 2 mg via INTRAVENOUS

## 2012-10-20 MED ORDER — FENTANYL (PF) 50 MCG/ML INJECTION SOLUTION
Freq: Once | INTRAMUSCULAR | Status: DC | PRN
Start: 2012-10-20 — End: 2012-10-20
  Administered 2012-10-20: 50 ug via INTRAVENOUS

## 2012-10-20 MED ORDER — SODIUM CHLORIDE 0.9 % (FLUSH) INJECTION SYRINGE
2.00 mL | INJECTION | Freq: Three times a day (TID) | INTRAMUSCULAR | Status: DC
Start: 2012-10-20 — End: 2012-10-20

## 2012-10-20 MED ORDER — PREDNISONE 20 MG TABLET
30.0000 mg | ORAL_TABLET | Freq: Every day | ORAL | Status: AC
Start: 2012-10-20 — End: 2012-10-27

## 2012-10-20 MED ORDER — LACTATED RINGERS INTRAVENOUS SOLUTION
INTRAVENOUS | Status: DC
Start: 2012-10-20 — End: 2012-10-20

## 2012-10-20 MED ORDER — HEPARIN LOCK FLUSH (PORCINE) 100 UNIT/ML INTRAVENOUS SOLUTION
INTRAVENOUS | Status: AC
Start: 2012-10-20 — End: 2012-10-20
  Filled 2012-10-20: qty 5

## 2012-10-20 MED ORDER — SODIUM CHLORIDE 0.9 % (FLUSH) INJECTION SYRINGE
2.00 mL | INJECTION | INTRAMUSCULAR | Status: DC | PRN
Start: 2012-10-20 — End: 2012-10-20

## 2012-10-20 MED ORDER — SODIUM CHLORIDE 0.9 % INTRAVENOUS SOLUTION
20.0000 mL | Freq: Once | INTRAVENOUS | Status: DC | PRN
Start: 2012-10-20 — End: 2012-10-20

## 2012-10-20 MED ORDER — LIDOCAINE (PF) 10 MG/ML (1 %) INJECTION SOLUTION
30.0000 mL | Freq: Once | INTRAMUSCULAR | Status: DC | PRN
Start: 2012-10-20 — End: 2012-10-20
  Administered 2012-10-20: 16 mL via INTRAMUSCULAR

## 2012-10-20 MED ORDER — EPHEDRINE SULFATE 50 MG/ML INJECTION SOLUTION
Freq: Once | INTRAMUSCULAR | Status: DC | PRN
Start: 2012-10-20 — End: 2012-10-20
  Administered 2012-10-20: 10 mg via INTRAVENOUS
  Administered 2012-10-20: 5 mg via INTRAVENOUS
  Administered 2012-10-20: 10 mg via INTRAVENOUS

## 2012-10-20 MED ORDER — HYDROMORPHONE 1 MG/ML INJECTION WRAPPER
0.2000 mg | INJECTION | INTRAMUSCULAR | Status: DC | PRN
Start: 2012-10-20 — End: 2012-10-20

## 2012-10-20 MED ORDER — PROPOFOL 10 MG/ML INTRAVENOUS EMULSION
INTRAVENOUS | Status: DC | PRN
Start: 2012-10-20 — End: 2012-10-20
  Administered 2012-10-20: 0 ug/kg/min via INTRAVENOUS
  Administered 2012-10-20: 75 ug/kg/min via INTRAVENOUS
  Administered 2012-10-20: 100 ug/kg/min via INTRAVENOUS

## 2012-10-20 SURGICAL SUPPLY — 21 items
ADAPTER BRONCHSCP BLU 15MM PNEUPAC 2 AXIS SWVL FO O2 STRL LF  DISP (LAR) ×1 IMPLANT
ADAPTER BRONCHSCP BLU 15MM PNE_UPAC 2 AXIS SWVL FO O2 STRL LF (LAR) ×1
COVER HVDTY REINF LF  DISP 110X79IN TBL STRL (EQUIPMENT MINOR) ×1 IMPLANT
COVER HVDTY REINF LF DISP 110_X79IN TBL STRL (EQUIPMENT MINOR) ×1
DISCONTINUED NO SUB - JELLY LUB DYNALUBE BCTRST WATER SOL NGRS PKT STRL 5GM LF (WOUND CARE SUPPLY) ×2 IMPLANT
JELLY LUB EZ BCTRST H2O SOL NG_RS PKT STRL 5GM LF (WOUND CARE/ENTEROSTOMAL SUPPLY) ×2
KIT RM TURNOVER CLEANOP CSTM INFCT CONTROL (KITS & TRAYS (DISPOSABLE)) ×1
KIT RM TURNOVER CLEANOP CSTM I_NFCT CONTROL (KITS & TRAYS (DISPOSABLE)) ×1
KIT RM TURNOVER CLEANOP CUSTOM INFCT CONTROL (KITS & TRAYS (DISPOSABLE)) ×1 IMPLANT
LABEL E-Z STICK_STLEZP1 100EA/CS (LABELS/CHART SUPPLIES) ×1
LABEL MED EZ PEEL MRKR LF (LABELS/CHART SUPPLIES) ×1 IMPLANT
PAD ARMBOARD FOAM BLU_FP-ECARM (POSITIONING PRODUCTS) ×2
PAD ARMBRD BLU (POSITIONING PRODUCTS) ×2 IMPLANT
POSITION OR RSPBRY SWIRL 8X8.5X4IN DVN HEAD POLYUR FOAM SLOT CRDL (SUPP) ×1 IMPLANT
POSITION POSITION HEAD FOAM SL_OT (SUPP) ×1
SPONGE GAUZE STRL 4 X 4IN TUB_6939 1280/CS (WOUND CARE SUPPLY) ×2 IMPLANT
SYRINGE 50ML LF  STRL GRAD N-PYRG DEHP-FR PVC FREE MED DISP CLR (NEEDLES & SYRINGE SUPPLIES) ×1 IMPLANT
SYRINGE 50ML LF STRL GRAD N-P_YRG DEHP-FR PVC FREE MED DISP (NEEDLES & SYRINGE SUPPLIES) ×1
SYRINGE 5ML LF  STRL ST GRAD MED POLYPROP DISP (NEEDLES & SYRINGE SUPPLIES) ×2 IMPLANT
SYRINGE BD 5ML LF STRL ST GRA_D MED POLYPROP DISP (NEEDLES & SYRINGE SUPPLIES) ×2
SYRINGE HYPO 10CC LL 309604 100/BX (Syringes w/ o Needles) ×1 IMPLANT

## 2012-10-20 NOTE — Anesthesia Postprocedure Evaluation (Signed)
ANESTHESIA POSTOP EVALUATION NOTE        Anesthesia Service      [facility]     10/20/2012     Last Vitals: Temperature: 36.6 C (97.9 F) (10/20/12 1203)  Heart Rate: 58 (10/20/12 1203)  BP (Non-Invasive): 129/93 mmHg (10/20/12 1203)  Respiratory Rate: 12 (10/20/12 1203)  SpO2-1: 98 % (10/20/12 1203)    Procedure(s):  ULTRASOUND ENDOBRONCHIAL  BRONCHOSCOPY FLEXIBLE ADULT    Patient is sufficiently recovered from the effects of anesthesia to participate in the evaluation and has returned to their pre-procedure level.  I have reviewed and evaluated the following:  Respiratory Function: Consistent with pre anesthetic level  Cardiovascular Function: Consistent with pre anesthetic level  Mental Status: Return to pre anesthetic baseline level  Pain: Sufficiently controlled with medication  Nausea and Vomiting: Absent or sufficiently controlled with medication  Post-op Anesthetic Complications: None    Comment/ re-evaluation for any variations: None

## 2012-10-20 NOTE — OR PreOp (Signed)
Patient pre-op assessment complete at this time. Questions and concerns addressed. Will continue to monitor.

## 2012-10-20 NOTE — Discharge Instructions (Signed)
SURGICAL DISCHARGE INSTRUCTIONS     Dr. Octavio Graves, MD  performed your ULTRASOUND ENDOBRONCHIAL, BRONCHOSCOPY FLEXIBLE ADULT today at the [facility]    [facility]:  Monday through Friday from 6 a.m. - 7 p.m.: [phone]  Between 7 p.m. - 6 a.m., weekends and holidays:  Call [facility] at [phone] or [phone].    PLEASE SEE WRITTEN HANDOUTS AS DISCUSSED BY YOUR NURSE:      SIGNS AND SYMPTOMS OF A WOUND / INCISION INFECTION   Be sure to watch for the following:   Increase in redness or red streaks near or around the wound or incision.   Increase in pain that is intense or severe and cannot be relieved by the pain medication that your doctor has given you.   Increase in swelling that cannot be relieved by elevation of a body part, or by applying ice, if permitted.   Increase in drainage, or if yellow / green in color and smells bad. This could be on a dressing or a cast.   Increase in fever for longer than 24 hours, or an increase that is higher than 101 degrees Fahrenheit (normal body temperature is 98 degrees Fahrenheit). The incision may feel warm to the touch.    **CALL YOUR DOCTOR IF ONE OR MORE OF THESE SIGNS / SYMPTOMS SHOULD OCCUR.    ANESTHESIA INFORMATION   ANESTHESIA -- ADULT PATIENTS:  You have received intravenous sedation / general anesthesia, and you may feel drowsy and light-headed for several hours. You may even experience some forgetfulness of the procedure. DO NOT DRIVE A MOTOR VEHICLE or perform any activity requiring complete alertness or coordination until you feel fully awake in about 24-48 hours. Do not drink alcoholic beverages for at least 24 hours. Do not stay alone, you must have a responsible adult available to be with you. You may also experience a dry mouth or nausea for 24 hours. This is a normal side effect and will disappear as the effects of the medication wear off.    REMEMBER   If you experience any difficulty breathing, chest  pain, bleeding that you feel is excessive, persistent nausea or vomiting or for any other concerns:  Call your physician Dr. Adam Phenix at [phone] or [phone]. You may also ask to have the pulmonology doctor on call paged. They are available to you 24 hours a day.    SPECIAL INSTRUCTIONS / COMMENTS       FOLLOW-UP APPOINTMENTS   Please call patient services at [phone] or [phone] to schedule a date / time of return. They are open Monday - Friday from 7:30 am - 5:00 pm.

## 2012-10-20 NOTE — Procedures (Addendum)
Flexible Bronchoscopy with Convex Probe EBUS      Procedure Date:  10/20/2012 Time:  12: 30 pm  Procedure: Flexible Bronchoscopy with Convex Probe EBUS  Diagnosis:  Mediastinal Lymphadenopathy  Indication:  To rule out Sarcoidosis vs Malignancy    Description:     The Patient was seen and evaluated in the pre operative area. History and Physical exam was obtained. Medications and blood work was reviewed. Informed consent was obtained. The patient was brought to the operating room and time out was performed. The patient was intubated with LMA. The Flexible Bronchoscope was inserted through the LMA. The vocal cords were visualized. 1 % Lidocaine was instilled onto the cords . The scope was inserted through the vocal cords. Lidocaine was again instilled in the right and left main stem bronchus. All airways were inspected no endobronchial lesion was identified. Minimal secretions were seen and aspirated as completely as possible. The Flexible Bronchoscope was removed and the Convex probe EBUS Scope was introduced. Station 7 Subcarinal lymph node was identified under ultrasound and 3 passes were performed at Station 7. No significant Bleeding Identified.  Under ultrasound station 4R Right paratracheal lymph node was identified and 3 passes were performed in the right para tracheal lymph node. No significant bleeding identified. Another pass in the end was performed at Station 7. No significant bleeding identified . The EBUS scope was removed at the end of the procedure. The flexible scope was reintroduced all airways were examined no significant bleeding identified. The flexible scope was removed . The patient was extubated and brought to the recovery room. Post Procedure CXR did not show any evidence of Pneumothorax. The Cytopath FNA samples from Station 7 and Station 4R were sent for analysis.      Problem List:    1. Mediastinal Lymphadenopathy s/p EBUS station 4R and Station 7     2. Adenocarcinoma of Stomach     3. Airway inflammation with some vascularity      Assessment / Plan:      1.  We would await the cytopath FNA results    2. Patient prescribed Prednisone 30 mg for 7 days and Z pack.      Patient would be informed of the results when available and a return appointment in cancer clinic would be scheduled      Dr Carollee Herter was present and supervised the procedure              Francoise Schaumann, MD 10/20/2012, 4:30 PM    I was present and supervised/observed the entire procedure.  Octavio Graves, MD 10/21/2012, 11:30 PM

## 2012-10-20 NOTE — Anesthesia Preprocedure Evaluation (Addendum)
Physical Exam:     Airway       Mallampati: I    TM distance: >3 FB    Neck ROM: full  Mouth Opening: good.  No Facial hair  No Beard  No endotracheal tube present  No Tracheostomy present    Dental       Dentition intact             Pulmonary    Breath sounds clear to auscultation  (-) no rhonchi, no decreased breath sounds, no wheezes, no rales and no stridor     Cardiovascular    Rhythm: regular  Rate: Normal  (-) no murmur     Other findings            Anesthesia Plan:  Planned anesthesia type: general  ASA 3     Intravenous induction   Patient's NPO status is appropriate for Anesthesia.    Anesthetic plan and risks discussed with patient and other.    Anesthesia issues/risks discussed are: Dental Injuries, PONV, Post-op Pain Management and Intraoperative Awareness/ Recall.        Plan discussed with resident.                    EKG: Within last six months   07/20/2012  Ventricular Rate 104 BPM  Atrial Rate 104 BPM  P-R Interval 182 ms  QRS Duration 84 ms  QT 320 ms  QTc 420 ms  P Axis 50 degrees  R Axis 21 degrees  T Axis 13 degrees  Sinus tachycardia  Confirmed by Leo Rod MD, Sandip Power ([id number]), editor RIGGS, MICHELLE ([id number]) on 07/20/2012 8:13:03 PM         CXR: In last year   07/24/2012  A right jugular central line and port are in place. The catheter tip is in the proximal SVC area. A right PICC is in place, but the tip projects over the spine and I cannot identify it with certainty. Surgical staples and clips are present in the upper abdomen.   The lungs are clear. The heart is mildly enlarged. No pulmonary edema is present.   IMPRESSION: Placement of right jugular central line and port without evidence of complication. Right PICC in place. Mild cardiac enlargement without evidence of pulmonary edema. Postoperative changes in the abdomen.     Other Studies: PAT labs      Recent Labs     10/10/12   0958    WBC 7.7    HGB 9.3*    HCT 29.5*    PLTCNT 131*   131*      BMP Results  ABG Results    Recent Labs      10/10/12   0958    CREATININE 0.81      Liver Test Results  Cardiac Results    Recent Labs     10/10/12   0958    TOTBILIRUBIN 0.3    AST 20    ALT 5    ALKPHOS 93      PET/CT 09/04/2012  IMPRESSION:   1. Right upper lobe pulmonary nodule question metastatic disease.   2. Mediastinal hilar adenopathy which may represent malignancy.   3. Upper abdominal area with multiple surgical clips consistent with prior surgical procedure.   4. Right neck lymph node likely reactive but malignancy cannot be excluded.     PFT  Interpretation:  Normal spirometry with no significant bronchodilator response. Lung   volumes show mild restrictive impairment. Moderately reduced diffusing  capacity   **This interpretation has been electronically signed: Reymundo Poll 10/10/2012 04:24:22 PM**       Consults: None    STOP BANG Score (0-8):     Patient   Last ate-11 pm on 10/19/2012 chicken , biscuit and piece of pie  Last drank- 8:30 am sip of water with meds 10/20/2012  Last medications- zantac and atenolol at 8:30 am 10/20/2012

## 2012-10-20 NOTE — OR PreOp (Signed)
PT/INR sent.  Per Dr. Welton Flakes, no need to wait for results before proceeding to OR.

## 2012-10-23 ENCOUNTER — Encounter (HOSPITAL_COMMUNITY): Payer: Self-pay

## 2012-10-23 LAB — HISTORICAL CYTOPATHOLOGY-FINE NEEDLE ASPIRATE

## 2012-10-24 ENCOUNTER — Encounter (HOSPITAL_BASED_OUTPATIENT_CLINIC_OR_DEPARTMENT_OTHER): Payer: Self-pay

## 2012-10-24 ENCOUNTER — Telehealth (INDEPENDENT_AMBULATORY_CARE_PROVIDER_SITE_OTHER): Payer: Self-pay | Admitting: Pediatrics

## 2012-10-24 ENCOUNTER — Telehealth (INDEPENDENT_AMBULATORY_CARE_PROVIDER_SITE_OTHER): Payer: Self-pay

## 2012-10-24 NOTE — Telephone Encounter (Signed)
FNA showed no malignancy  Re-review slides: biopsy c/w sarcoidosis as per Dr Bonney Leitz,     Spoke to pt and discussed biopsy results    Octavio Graves, MD

## 2012-10-24 NOTE — Progress Notes (Signed)
Late entry for 10/10/2012:   MSW provided pt with financial assistance through the [employer] for pt's nutritional supplements through the Discounted Nutritional Program offered at [facility] on 4/29.     Gwyndolyn Saxon, MSW

## 2012-11-08 ENCOUNTER — Other Ambulatory Visit (HOSPITAL_BASED_OUTPATIENT_CLINIC_OR_DEPARTMENT_OTHER): Payer: Self-pay | Admitting: Hematology & Oncology

## 2012-11-10 ENCOUNTER — Ambulatory Visit (INDEPENDENT_AMBULATORY_CARE_PROVIDER_SITE_OTHER): Payer: Self-pay | Admitting: Clinical Genetics (M.D.)

## 2012-11-10 ENCOUNTER — Ambulatory Visit (HOSPITAL_BASED_OUTPATIENT_CLINIC_OR_DEPARTMENT_OTHER): Payer: MEDICAID | Admitting: SURGICAL ONCOLOGY

## 2012-11-10 ENCOUNTER — Encounter (HOSPITAL_BASED_OUTPATIENT_CLINIC_OR_DEPARTMENT_OTHER): Payer: Self-pay | Admitting: Hematology & Oncology

## 2012-11-10 ENCOUNTER — Encounter (HOSPITAL_BASED_OUTPATIENT_CLINIC_OR_DEPARTMENT_OTHER): Payer: Self-pay

## 2012-11-10 ENCOUNTER — Ambulatory Visit

## 2012-11-10 ENCOUNTER — Ambulatory Visit
Admission: RE | Admit: 2012-11-10 | Discharge: 2012-11-10 | Disposition: A | Discharge status: Home / Self Care | Payer: MEDICAID | Source: Ambulatory Visit | Attending: Hematology & Oncology | Admitting: Hematology & Oncology

## 2012-11-10 ENCOUNTER — Ambulatory Visit (HOSPITAL_BASED_OUTPATIENT_CLINIC_OR_DEPARTMENT_OTHER)
Admission: RE | Admit: 2012-11-10 | Discharge: 2012-11-10 | Disposition: A | Discharge status: Home / Self Care | Payer: MEDICAID | Source: Ambulatory Visit | Admitting: Hematology & Oncology

## 2012-11-10 ENCOUNTER — Encounter (INDEPENDENT_AMBULATORY_CARE_PROVIDER_SITE_OTHER): Payer: Self-pay | Admitting: Clinical Genetics (M.D.)

## 2012-11-10 ENCOUNTER — Ambulatory Visit (HOSPITAL_BASED_OUTPATIENT_CLINIC_OR_DEPARTMENT_OTHER): Payer: MEDICAID | Admitting: Clinical Genetics (M.D.)

## 2012-11-10 ENCOUNTER — Other Ambulatory Visit (HOSPITAL_COMMUNITY): Payer: Self-pay

## 2012-11-10 VITALS — BP 133/89 | HR 76 | Temp 97.2°F | Ht 62.01 in | Wt 137.1 lb

## 2012-11-10 VITALS — BP 150/70 | Temp 97.0°F | Ht 61.0 in | Wt 130.0 lb

## 2012-11-10 DIAGNOSIS — G43909 Migraine, unspecified, not intractable, without status migrainosus: Secondary | ICD-10-CM | POA: Insufficient documentation

## 2012-11-10 DIAGNOSIS — D869 Sarcoidosis, unspecified: Secondary | ICD-10-CM | POA: Insufficient documentation

## 2012-11-10 DIAGNOSIS — G8929 Other chronic pain: Secondary | ICD-10-CM | POA: Insufficient documentation

## 2012-11-10 DIAGNOSIS — Z86711 Personal history of pulmonary embolism: Secondary | ICD-10-CM | POA: Insufficient documentation

## 2012-11-10 DIAGNOSIS — Z903 Acquired absence of stomach [part of]: Secondary | ICD-10-CM | POA: Insufficient documentation

## 2012-11-10 DIAGNOSIS — K219 Gastro-esophageal reflux disease without esophagitis: Secondary | ICD-10-CM | POA: Insufficient documentation

## 2012-11-10 DIAGNOSIS — G893 Neoplasm related pain (acute) (chronic): Secondary | ICD-10-CM | POA: Insufficient documentation

## 2012-11-10 LAB — ALK PHOS (ALKALINE PHOSPHATASE): ALKALINE PHOSPHATASE: 87 U/L (ref <150)

## 2012-11-10 LAB — BLOOD CELL COUNT W/DIFF - CANCER CENTER
BASOPHILS: 0 %
BASOS ABS: 0.019 THOU/uL (ref 0.0–0.2)
EOS ABS: 0.125 THOU/uL (ref 0.0–0.5)
EOSINOPHIL: 2 %
HCT: 32.6 % — ABNORMAL LOW (ref 33.5–45.2)
HGB: 10.2 g/dL — ABNORMAL LOW (ref 11.2–15.2)
LYMPHOCYTES: 11 %
LYMPHS ABS: 0.687 THOU/uL — ABNORMAL LOW (ref 1.0–4.8)
MCH: 25.6 pg — ABNORMAL LOW (ref 27.4–33.0)
MCHC: 31.4 g/dL — ABNORMAL LOW (ref 32.5–35.8)
MCV: 81.4 fL (ref 78–100)
MONOCYTES: 13 %
MONOS ABS: 0.807 10*3/uL (ref 0.3–1.0)
MPV: 8.6 fL (ref 7.5–11.5)
PLATELET COUNT (AUTO): 140 THOU/uL (ref 140–450)
PLATELET COUNT: 140 THOU/uL (ref 140–450)
PMN ABS (AUTO): 4.673 THOU/uL (ref 1.5–7.7)
PMN ABS: 4.673 10*3/uL (ref 1.5–7.7)
PMN'S: 74 %
RBC: 4 MIL/uL (ref 3.63–4.92)
RDW: 19.5 % — ABNORMAL HIGH (ref 12.0–15.0)
WBC: 6.3 10*3/uL (ref 3.5–11.0)

## 2012-11-10 LAB — CREATININE WITH EGFR
CREATININE: 0.74 mg/dL (ref 0.49–1.10)
ESTIMATED GLOMERULAR FILTRATION RATE: >59 ml/min/1.73m2 (ref >59)

## 2012-11-10 LAB — AST (SGOT): AST (SGOT): 17 U/L (ref 8–45)

## 2012-11-10 LAB — LDH: LDH: 148 U/L (ref 125–220)

## 2012-11-10 LAB — CARCINOEMBRYONIC ANTIGEN: CARCINOEMBRYONIC AG: 0.9 ng/mL (ref <5.0)

## 2012-11-10 LAB — BILIRUBIN TOTAL: BILIRUBIN, TOTAL: 0.9 mg/dL (ref 0.3–1.3)

## 2012-11-10 LAB — ALT (SGPT): ALT (SGPT): 6 U/L (ref <55)

## 2012-11-10 MED ORDER — OXYCODONE 15 MG TABLET
15.00 mg | ORAL_TABLET | Freq: Four times a day (QID) | ORAL | Status: DC | PRN
Start: 2012-11-10 — End: 2014-01-11

## 2012-11-10 NOTE — Nurses Notes (Signed)
0930 - Patient to VAD for labs prior to MD visit. Port accessed with + blood return. Labs obtained/sent, port flushed per protocol and secured with transparent dressing (patient requested to have port left accessed in case clinic needed more blood work). Sent ambulatory to lobby to wait for exam room. HBurns, RN

## 2012-11-10 NOTE — Cancer Center Note (Signed)
[facility]   Michele Mason [facility]CER [facility]ENTER    Date: 11/10/2012  Name: Michele Mason  MRN: [id number]  Primary Care Provider: No Established Pcp    REASON FOR VISIT: 37 y.o.female for evaluation and management of gastric cancer.  HISTORY OF PRESENT ILLNESS:   07/04/12. Patient was seen by Dr. Concha Se in [place] and underwent a colonoscopy and EGD and the mass was biopsied from the antrum with report of a highly suspicious for invasive adenocarcinoma of the stomach with partial obstruction. She was transferred to [facility].   07/19/12: Exploratory laparotomy by Dr. Leota Sauers, lysis of adhesions, prior abdominal scar excision. Distal gastrectomy and D2 lymphadenectomy. Lymph-vascular invasion: Not identified. Pathologic staging: pT3N0MX. Her-2 testing negative by IHC and FISH.  3. Recommended postoperative adjuvant chemoradiation as per [id number] data and referred to local medical oncologist Dr. Tobin Chad and patient subsequently changed her care to Dr. Deretha Emory. Radiation onc consult was done by Dr. Joslyn Devon and started on postoperative XRT.   4. 10/20/12: Bronch with EBUS and FNA of mediastinal nodes showed small clusters of epithelioid histiocytes are noted, consistent with sarcoidosis.    Michele Mason is 37 y.o. female who is here for follow up. The patient is accompanied by her father who provided some of the history. Sarcoidosis diagnosed previously in state of [place] and confirmed by recent mediastinal node biopsy done. Patient has now started XRT therapy by Dr. Jari Favre and plans to get concurrent chemotherapy closer to her [facility] at [facility]. She has some heartburn and abdominal cramping occasionally, but over all doing well.    REVIEW OF SYSTEMS:  General: (+) pain. (-) fevers (+) chills. (-) weight loss. (-) fatigue.  Lymphatic: (-) palpable masses. (+) night sweats.  Heme: + easy bruising (-) bleeding.  (-) recurrent infections.    HEENT. (-) vision (-) hearing changes. (-) dysphagia. (-) sore throat.   Heart: (-) chest pain. (-) palpitation. (-) orthopnea. (-) LE edema.   Lungs: (+) dyspnea (on exertion) (-) hemoptysis. (-) cough.  Abdomen: (-) poor appetite. (+) abdominal pain. (+) nausea (-) vomiting. (-) diarrhea. (+) constipation.   GU: (-) dysuria (-) Urgency. (-) Hematuria.   MS. (+) joint pain (-) ext swelling. (+) Back pain.    Dermatologic: (-) rashes. (-) pruritus.   Psychiatric: (-) Depression. (-) anxiety. (+) insomnia.   Neurologic: (+) headaches. (-) neuropathy. (-) weakness. (-) memory problems.  Other review of systems negative.     PAST MEDICAL HISTORY:  Past Medical History   Diagnosis Date   . Moderate protein-calorie malnutrition 07/10/2012   . Gastric cancer 07/10/2012     per patient stage 3   . Chronic pain disorder 07/10/2012   . Sarcoidosis 07/10/2012   . Palpitations    . Nutritional disorder    . Weight loss, abnormal    . Anemia    . DVT (deep vein thrombosis) in pregnancy 2001   . Anemia, chronic disease 07/23/2012   . De Quervain's tenosynovitis, left 07/23/2012   . Crohn disease 07/23/2012     Pt was treated for Crohn's for 10 years, has not had any relapses and no steroids for more than a decade.   . Personal history of PE (pulmonary embolism) 07/23/2012   . Anxiety 07/23/2012   . Clot    . Essential hypertension    . Cough    . Shortness of breath      walking up steps and has shortness of  breath , walking 20 feet on the flat   . Heart murmur      states 37 years of age   . Dysrhythmias      tachycardia   . Hypertension    . History of transfusion      no reaction   . Pulmonary embolism 2001   . CVA (cerebrovascular accident)      left side weakness   . Peripheral neuropathy    . Migraine    . Depression    . Contact dermatitis and other eczema, due to unspecified cause      rash in the chest that is resolving   . Bruises easily    . ITP (idiopathic thrombocytopenic purpura)    . Decorative tattoo    . Back problem     . Neck problem    . GERD (gastroesophageal reflux disease)    . Ear piercing    . Decorative tattoo      both arms, back, R leg and R chest     ALLERGIES:  Allergies   Allergen Reactions   . Adhesive Rash and Itching     MEDICATIONS:  Current Outpatient Prescriptions   Medication Sig   . albuterol sulfate (PROVENTIL OR VENTOLIN) 90 mcg/actuation Inhalation HFA Aerosol Inhaler Take 1-2 Puffs by inhalation Every 6 hours as needed   . ALPRAZolam (XANAX) 2 mg Oral Tablet Take 2 mg by mouth Every night as needed for Insomnia   . atenolol (TENORMIN) 50 mg Oral Tablet Take 50 mg by mouth Once a day Patient states she only takes it as needed.   . budesonide-formoterol (SYMBICORT) 160-4.5 mcg/actuation Inhalation HFA Aerosol Inhaler Take 2 Puffs by inhalation Twice daily   . ondansetron (ZOFRAN) 4 mg Oral Tablet Take 1 Tab (4 mg total) by mouth Every 8 hours as needed for nausea/vomiting   . oxyCODONE (OXYCONTIN) 20 mg Oral Tablet Sustained Release 12 hr Take 1 Tab (20 mg total) by mouth Every 12 hours   . oxyCODONE (ROXICODONE) 15 mg Oral Tablet Take 1 Tab (15 mg total) by mouth Every 6 hours as needed for Pain   . Ranitidine HCl (ZANTAC) 300 mg Oral Tablet Take 1 Tab (300 mg total) by mouth Every evening     Family History   Problem Relation Age of Onset   . Sarcoidosis Mother    . Hepatitis B Mother      Hepatitis c   . Heart Attack Father    . Healthy Brother    . Breast Cancer Paternal Aunt 42     breast cancer   . Cancer Paternal Grandmother 40     brain tumor   . Colon Cancer Paternal Grandfather 79     colon cancer         PHYSICAL EXAMINATION:  Vitals:   Most Recent Vitals      Office Visit from 11/10/2012 in Genetics-P[facility]    Temperature 36.2 C (97.2 F) filed at... 11/10/2012 1300    Heart Rate 76 filed at... 11/10/2012 1300    Respiratory Rate     BP (Non-Invasive) 133/89 mmHg filed at... 11/10/2012 1300    Height 1.575 m (5' 2.01") [with shoes] filed at... 11/10/2012 1300     Weight 62.2 kg (137 lb 2 oz) [with shoes] filed at... 11/10/2012 1300    BMI (Calculated)     BSA (Calculated)         PHYSICAL EXAMINATION:  ECOG PS 0 - Fully active, able to  carry on all pre-disease performance without restriction.     Consitutional:  No acute distress.  Non-Toxic.   Eyes: EOMI. No discharge. No Jaundice.   ENT: Moist mucous membranes. No posterior pharynx lesions.   Heme/ Lymph: No Cervical,  Inguinal, axillary lymph nodes. No Bruising.   Cardiovascular:  Tachycardic S1, S2,  No murmur. Left sided Port site normal.  Respiratory: Normal  Breath Sounds,   No rhonchi, crackles, or wheezing.  Abdomen: hypoactive BS.  Well healed midline scar. Non-tender.  MS: No pitting/trace edema.  Skin: Normal turgor. No Rashes,skin  lesions  Psychiatry: Normal Affect. Intact memory. Cooperative.   Neuro: No focal deficits. Alert and Oriented x 3      LABORATORY:  Results for orders placed during the hospital encounter of 11/10/12 (from the past 36 hour(s))   ALK PHOS (ALKALINE PHOSPHATASE)       Result Value Range    ALKALINE PHOSPHATASE 87  <150 U/L   ALT (SGPT)       Result Value Range    ALT (SGPT) 6  <55 U/L   AST (SGOT)       Result Value Range    AST (SGOT) 17  8 - 45 U/L   BILIRUBIN TOTAL       Result Value Range    BILIRUBIN, TOTAL 0.9  0.3 - 1.3 mg/dL   BLOOD CELL COUNT W/DIFF - CANCER CENTER       Result Value Range    WBC 6.3  3.5 - 11.0 THOU/uL    RBC 4.00  3.63 - 4.92 MIL/uL    HGB 10.2 (*) 11.2 - 15.2 g/dL    HCT 16.1 (*) 09.6 - 45.2 %    MCV 81.4  78 - 100 fL    MCH 25.6 (*) 27.4 - 33.0 pg    MCHC 31.4 (*) 32.5 - 35.8 g/dL    RDW 04.5 (*) 40.9 - 15.0 %    PLATELET COUNT 140  140 - 450 THOU/uL    MPV 8.6  7.5 - 11.5 fL    PMN'S 74      PMN ABS 4.673  1.5 - 7.7 THOU/uL    LYMPHOCYTES 11      LYMPHS ABS 0.687 (*) 1.0 - 4.8 THOU/uL    MONOCYTES 13      MONOS ABS 0.807  0.3 - 1.0 THOU/uL    EOSINOPHIL 2      EOS ABS 0.125  0.0 - 0.5 THOU/uL    BASOPHILS 0      BASOS ABS 0.019  0.0 - 0.2 THOU/uL     PMN ABS 4.673  1.5 - 7.7 THOU/uL    PLATELET COUNT 140  140 - 450 THOU/uL   CARCINOEMBRYONIC ANTIGEN       Result Value Range    CARCINOEMBRYONIC AG 0.9  <5.0 ng/mL   CREATININE       Result Value Range    CREATININE 0.74  0.49 - 1.10 mg/dL    ESTIMATED GLOMERULAR FILTRATION RATE >59  >59 ml/min/1.32m2   LDH       Result Value Range    LDH 148  125 - 220 U/L       IMAGING:  I have visualized the imaging studies I reviewed the findings with the patient.    PET CT 09/04/12  CT findings are as integrated with PET findings.   IMPRESSION:   1. Right upper lobe pulmonary nodule question metastatic disease.   2. Mediastinal hilar adenopathy which  may represent malignancy.   3. Upper abdominal area with multiple surgical clips consistent with prior surgical procedure.   4. Right neck lymph node likely reactive but malignancy cannot be excluded.     ASSESSMENT:   1.  37 yo female with T3N0Mx gastric cancer.  She is s/p distal gastrectomy and D2 lymphadenectomy.  Tumor size 4.2 x 3.5 x 3.0 cm.  Adenocarcinoma, intestinal type.  Gr 2 (moderately differentiated)  PT3N0Mx. Her-2 negative by IHC and FISH.  She is currently getting adjuvant chemoRT closer to home.  2. PMH of chronic pain, sarcoidosis, multiple abdominal surgeries, and possible crohn's disease  3.  Pain related to malignancy and post surgical healing.  4.  Mediastinal hilar adenopathy and RUL nodule on PET CT. EBUS Bx 10/20/12 confirmed sarcoidosis.  5.  Family history of multiple cancers.  6. History of migraine headaches.    PLAN:   1. Gastric Cancer:  Continue with the adjuvant chemoradiation at her local facility and follows with Dr. Joslyn Devon. Discussed Intergroup trial ([id number]) which demonstrated a significant survival benefit for chemoradiotherapy after complete resection.  Discussed the option of continuous infusion FU (200 mg/m2 daily) during RT or daily oral capecitabine (825 mg/m2 twice daily five days per week with radiation).      2. She had appointment with Genetics clinic today for counseling due to notable family history of cancers and her young age of cancer diagnosis.    3. Mediastinal and Hilar Adenopathy- Seen by pulmonary thoracic oncology clinic for evaluation. 10/20/12: Bronch with EBUS and FNA of mediastinal nodes showed small clusters of epithelioid histiocytes are noted, consistent with sarcoidosis.    4. Pain related to malignancy and treatment. OxyContin 20 mg BID and Oxycodone 15 mg prn for breakthrough pain. Evaluated her and refilled pain meds by surgonc clinic this morning.    5. RTC for follow up in 3 months with restaging PET/CT and CEA in conjunction with her surgonc visit here and in the interim she will follow up closely with her local medical and radiation oncologists.      Threasa Alpha, MD 11/10/2012, 3:05 PM  Associate Professor,   Section of Hematology/Oncology   [facility] Department of Medicine

## 2012-11-22 ENCOUNTER — Encounter (INDEPENDENT_AMBULATORY_CARE_PROVIDER_SITE_OTHER): Payer: Self-pay | Admitting: Clinical Genetics (M.D.)

## 2012-11-22 NOTE — Progress Notes (Signed)
 We had the opportunity to see Michele Mason in our [facility] in [place] on September 10, 2012.    Michele Mason is a 37 y.o. female with a chief complaint of gastric cancer and a family history of cancer. She is here to be evaluated to determine if there is a genetic etiology to her condition.    [name] was diagnosed with Crohn's disease at 37 years of age and was on a gluten-free diet for some time. She had two bowel obstructions in 1987 and 1988. She was diagnosed with hypertension in 2010 and was medicated. By November 2011, she had a mild stroke. She also has a history of sarcoidosis and ITP.     In 2013, she began to experience weakness, had difficulty eating, and lost 21 pounds. In January 2014 she had an endoscopy which found a mass in her stomach and days later she was diagnosed with gastric cancer. In February 2014 her stage II adenocarcinoma was removed. She is now receiving radiation therapy.     MEDICAL HISTORY  Past Medical History   Diagnosis Date   . Moderate protein-calorie malnutrition 07/10/2012   . Gastric cancer 07/10/2012     per patient stage 3   . Chronic pain disorder 07/10/2012   . Sarcoidosis 07/10/2012   . Palpitations    . Nutritional disorder    . Weight loss, abnormal    . Anemia    . DVT (deep vein thrombosis) in pregnancy 2001   . Anemia, chronic disease 07/23/2012   . De Quervain's tenosynovitis, left 07/23/2012   . Crohn disease 07/23/2012     Pt was treated for Crohn's for 10 years, has not had any relapses and no steroids for more than a decade.   . Personal history of PE (pulmonary embolism) 07/23/2012   . Anxiety 07/23/2012   . Clot    . Essential hypertension    . Cough    . Shortness of breath      walking up steps and has shortness of breath , walking 20 feet on the flat   . Heart murmur      states 37 years of age   . Dysrhythmias      tachycardia   . Hypertension    . History of transfusion      no reaction   . Pulmonary embolism 2001   . CVA (cerebrovascular accident)      left  side weakness   . Peripheral neuropathy    . Migraine    . Depression    . Contact dermatitis and other eczema, due to unspecified cause      rash in the chest that is resolving   . Bruises easily    . ITP (idiopathic thrombocytopenic purpura)    . Decorative tattoo    . Back problem    . Neck problem    . GERD (gastroesophageal reflux disease)    . Ear piercing    . Decorative tattoo      both arms, back, R leg and R chest       Past Surgical History   Procedure Laterality Date   . Hx small bowel resection       twice   . Hx ovarian cyst removal     . Endoscopic ultrasound upper  07/11/2012     ENDOSCOPIC U/S UPPER performed by Waverly Pleas, MD at [facility] OR ENDO   . Gastroscopy with biopsy  07/11/2012     GASTROSCOPY WITH BIOPSY  performed by Waverly Pleas, MD at [facility] OR ENDO   . Hx colonoscopy     . Hx subclavian port implantion Right    . Pb bronchoscopy,diagnostic N/A 10/20/2012     BRONCHOSCOPY FLEXIBLE ADULT performed by Feliz Meter, MD at [facility] OR [facility]   . Ultrasound endoscopic rectal  10/20/2012     ULTRASOUND ENDOBRONCHIAL performed by Feliz Meter, MD at [facility] OR [facility]         Outpatient Prescriptions Marked as Taking for the 11/10/12 encounter (Office Visit) with Vessie Pack, MD   Medication Sig   . albuterol  sulfate (PROVENTIL  OR VENTOLIN ) 90 mcg/actuation Inhalation HFA Aerosol Inhaler Take 1-2 Puffs by inhalation Every 6 hours as needed   . ALPRAZolam (XANAX) 2 mg Oral Tablet Take 2 mg by mouth Every night as needed for Insomnia   . atenolol  (TENORMIN ) 50 mg Oral Tablet Take 50 mg by mouth Once a day Patient states she only takes it as needed.   . budesonide -formoterol  (SYMBICORT ) 160-4.5 mcg/actuation Inhalation HFA Aerosol Inhaler Take 2 Puffs by inhalation Twice daily   . ondansetron  (ZOFRAN ) 4 mg Oral Tablet Take 1 Tab (4 mg total) by mouth Every 8 hours as needed for nausea/vomiting   . oxyCODONE  (OXYCONTIN ) 20 mg Oral Tablet Sustained Release 12 hr Take 1 Tab (20 mg total) by mouth Every 12  hours   . oxyCODONE  (ROXICODONE ) 15 mg Oral Tablet Take 1 Tab (15 mg total) by mouth Every 6 hours as needed for Pain   . Ranitidine  HCl (ZANTAC ) 300 mg Oral Tablet Take 1 Tab (300 mg total) by mouth Every evening       Allergies   Allergen Reactions   . Adhesive Rash and Itching     PREVIOUS GENETICS WORKUP  No prior Genetics work-up.    PHYSICAL EXAMINATION  Filed Vitals:    11/10/12 1300   BP: 133/89   Pulse: 76   Temp: 36.2 C (97.2 F)   TempSrc: Thermal Scan   Height: 1.575 m (5' 2.01)   Weight: 62.2 kg (137 lb 2 oz)   HC: 56 cm (22.05)     Kyoko was alert and oriented in time, space, and person.  Physical examination was deferred.    IMPRESSION  Michele Mason is a 37 y.o. female with a personal history of malignant neoplasm of the stomach and a  family history of cancer.    RECOMMENDATIONS  1. Cessation of smoking.  2. Avoid stress.     DISCUSSION  Given that [name] does not have a personal history of diffuse gastric cancer, a family history of gastric or other related cancers, nor several generations of early-onset cancer, we do not recommend any genetic testing for cancer. We explained that while some families have hereditary forms of cancer that are associated with increased cancer risks, the majority of cancers are sporadic in nature. Some features suggestive of a hereditary cancer predisposition would have included young ages at diagnosis and several relatives affected with the same type of cancer or related cancers spanning multiple generations.     As of the writing of this letter,we did not find any current studies looking into the etiology of early-onset gastric cancer. If [name] is remains interested in such options, she is welcome to check clinicaltrials.gov on a regular basis.    Given My's personal history of cancer, sarcoidosis, and hypertension, we recommended that she quit smoking and avoid stress if at all possible.    [name] expressed  understanding of the findings, our  counseling, and plan. We do not require any follow-up but would be happy to schedule another appointment if there is a significant change in Quorra's personal or family history of cancer.   Should you have any questions, you can reach me through our office [phone]] or via pager [phone].    See attached letter for detailed note.      This is a new patient visit, with total time spent regarding the patient's evaluation, chart and records review and care lasting at least 45 minutes with more than 50% of the time devoted to patient and family counseling.    Royce Bernhardt, MD 11/22/2012, 3:46 PM  Sophia Bernhardt, MD, [facility]  Assistant Professor of Pediatrics, Section of Medical [facility] Department of Pediatrics/Genetics

## 2012-11-22 NOTE — Progress Notes (Signed)
SURGICAL ONCOLOGY FOLLOW-UP EVALUATION:    PATIENT:  Michele Mason  MRN:  [id number]  DOB:   1975-06-20  DATE:  11/10/2012    CANCER DIAGNOSIS AND STAGE: stage IIA, pT3 pN0 cM0, intestinal type gastric cancer.     SURGICAL THERAPY:   1. 07/19/2012 - exploratory laparotomy, lysis of adhesions, distal gastrectomy, D2 lymphadenectomy, umbilical hernia repair  2. 07/24/2012 - R IJ port    SYSTEMIC THERAPY: Pending.     RADIATION THERAPY: Initiated 11/09/2012.    CLINICAL TRIAL: None.    CHIEF COMPLAINT: Abdominal pain, routine surveillance.    HISTORY OF PRESENT ILLNESS:  Michele Mason is a 37 y.o. Black/African American female who returns to clinic today for routine post-operative evaluation following distal gastrectomy on 07/19/2012. The patient's post-operative course was complicated by slow advancement to regular diet. She was discharged to Home w/PT and Nsg on 08/01/2012.  Since that time, she has undergone testing with pulmonary department and medical oncology here at [facility] for concern of pulmonary nodules and hilar lymphadenopathy. These were percutaneously biopsied and results were consistent with sarcoidosis.     She has also been seen by both medical and radiation oncology (Dr. Joslyn Devon) in her local area. In fact, she has already initiated radiation therapy within the last several days (11/08/2012). There have been attempts to set up, and intermittent chemotherapy, however she has had personality conflicts with several medical oncologists in her area.  Today, she presents with complaints of occasional nausea, pain in her legs, mild abdominal pain, and occasional constipation. The patient's main questions and concerns at this time are related to adjuvant chemotherapy. She has gotten in touch with [employer], a non-profit organization that helps to support patients with gastric cancer. During today's visit, the patient denies drainage, redness/inflammation or tenderness of her surgical incision or drain sites and states that her incisions have healed well. She is accompanied once again by her father who helps provide some of the above-mentioned history. Of note he is quite concerned and rather upset with his daughter regarding her personality differences with multiple physicians in her area.    REVIEW OF SYSTEMS:  Constitutional: pleasant, cooperative, denies fevers, chills or weight loss; does endorse some fatigue  HEENT: denies headache, dizziness, double vision, tinnitus, nasal congestion, or difficulty swallowing  Cardiovascular: denies chest pain, palpitations, dyspnea on exertion  Respiratory: denies SOB, cough  Gastrointestinal (GI): endorses abdominal pain around her umbilicus; denies nausea, vomiting, constipation, diarrhea  Genitourinary (GU): denies dysuria, hematuria  Gynecologic (GYN): denies vaginal discharge, abnormal vaginal bleeding  Musculoskeletal: denies weakness, joint pain  Endocrine: denies polydipsia, polyuria  Neurological: denies dizziness, facial droop, focal motor/sensory deficits  Psychiatric: denies anxiety, depression, sleeping disturbances     PAST MEDICAL, PAST SURGICAL, FAMILY, AND SOCIAL HISTORY:  The patient denies any changes to this portion of her history since her visit on 08/11/2012.    HOME MEDICATIONS:  albuterol sulfate;  ALPRAZolam;  atenolol;  budesonide-formoterol;  ondansetron;  oxyCODONE;  oxyCODONE;  Ranitidine HCl    ALLERGIES:  Allergies   Allergen Reactions   . Adhesive Rash and Itching     PHYSICAL EXAMINATION:  ECOG Performance Status: 1 - Symptomatic but completely ambulatory   Karnofsky Performance Status: 90% - capable of normal activity, few symptoms or signs of disease  General: pleasant, well-developed, well-nourished Black/African American female who is in no acute distress.    Vital Signs: BP 150/70   Temp(Src) 36.1 C (97 F)   Ht 1.549 m (  5\' 1" )   Wt 58.968 kg (130 lb)   BMI 24.58 kg/m2  Neck: supple, trachea is midline, thyroid is symmetric, not enlarged, and without nodularity  Lymphatics: no palpable lymphadenopathy of the cervical, supraclavicular, axillary, or inguinal nodal basins bilaterally  Cardiovascular: regular rate and rhythm, S1, S2 normal, no murmur, click, rub or gallop  Pulmonary: normal effort, chest expands symmetrically, lungs are clear to auscultation bilaterally.   Abdomen: non-distended, normal bowel sounds, no bruits, soft, no organomegaly or masses; incision is well healed, there is very mild tenderness at the umbilicus although I do not palpate a definitive hernia (on radiation planning CT there was an apparent small hernia in this location).  Extremities: without deformity, cyanosis, or edema  Neurologic: alert and oriented to person, place, time, and situation, cranial nerves grossly intact, no focal motor or sensory deficits  Psychiatric: speech pattern and movements are normal, normal mood, affect and judgment    LABORATORY REVIEW (I have personally reviewed all recent laboratory studies):   1. 11/10/2012 from [facility]: WBC 6.3, Hgb 10.2, plt 140, with normal MCV and differential.  Creatinine 0.74.  Total bilirubin 0.9, alkaline phosphatase 87, AST 17, ALT 6.  CEA 0.9 (pre-op was 1.4).    IMAGING REVIEW (I have personally reviewed both the reports and images):  1. 09/04/2012 PET/CT from [facility]: Right upper lobe pulmonary nodule question metastatic disease.  Mediastinal hilar adenopathy which may represent malignancy.  Upper abdominal area with multiple surgical clips consistent with prior surgical procedure.  Right neck lymph node likely reactive but malignancy cannot be excluded.  2. 10/20/2012 CXR from [facility]: No acute cardiopulmonary process. No visible pneumothorax. Postoperative changes.    OTHER STUDIES REVIEW:  1. 10/20/2012 EBUS and bx from [facility]: Station 7 Subcarinal lymph node was identified under ultrasound and 3 passes were performed at Station 7. No significant Bleeding Identified. Under ultrasound station 4R.  Right paratracheal lymph node was identified and 3 passes were performed in the right para tracheal lymph node. No significant bleeding identified. Another pass in the end was performed at Station 7. No significant bleeding identified.    PATHOLOGY REVIEW:  1. 07/19/2012 SKIN, SCAR, EXCISION: Scar. PELVIC OMENTUM, RESECTION: Unremarkable adipose tissue. PORTAL LYMPH NODE, BIOPSY: One lymph node, negative for malignancy (0/1). Non-caseating granulomas (see comment). STOMACH, DUODENAL BULB, AND OMENTUM; PARTIAL GASTRECTOMY:   - Moderately differentiated adenocarcinoma, intestinal-type, pT3N0 (see Cancer Case Summary). Two lymph nodes, negative for malignancy (0/2). STOMACH, PROXIMAL LESSER CURVATURE MARGIN, BIOPSY: Negative for malignancy. Focal intestinal metaplasia. STOMACH, PROXIMAL GREATER CURVATURE MARGIN, BIOPSY: Negative for malignancy. DUODENUM, DISTAL MARGIN, BIOPSY: One microscopic lymph node, negative for malignancy (0/1). LYMPH NODES, D2, RESECTION: Three lymph nodes, negative for malignancy (0/3). Non-caseating granulomas (see comment). STOMACH, FINAL PROXIMAL LESSER CURVATURE MARGIN: Negative for malignancy. An additional eleven lymph nodes were submitted and all were negative for malignancy, bringing the total to 18 lymph nodes, all negative for malignancy (0/18). T3 N0.  2. 10/20/2012 EBUS: Station 7 Lymph Node, Fine Needle Aspiration Cytology: NEGATIVE FOR MALIGNANT CELLS.  Few respiratory cells and pulmonary macrophages in a specimen consisting predominantly of unremarkable lymphocytes.  Station 4R Lymph Node, Fine Needle Aspiration Cytology: NEGATIVE FOR MALIGNANT CELLS. Few respiratory cells, pulmonary macrophages, neutrophils, and unremarkable lymphocytes.    ASSESSMENT:  37 y.o. Black/African American female with stage IIA, pT3 pN0 cM0, intestinal type gastric cancer.    PLAN:  1. The patient's examination and imaging were discussed with her in detail today.  I am quite concerned  that she has such difficulties finding a medical oncologist due to personality conflicts. I had a significant discussion with her regarding need for compliance, understanding, and the ability to work with someone she may not get along with 2 complete her therapy which is absolutely vital to the treatment of her malignancy. I have encouraged her to try and remained open minded and to see questions and problems from the other point of view to understand differences and navigate towards a solution.   2. I have personally spoken with Dr. Joslyn Devon with radiation oncology in the [place] area. He will attempt to arrange for her to be seen at the [facility] in [place] in order to obtain care from a medical oncologist. He also shares concerns regarding her personality conflicts and shall help to reinforce her compliance with future care.  3. I have discussed the multidisciplinary approaches to the treatment of gastric adenocarcinoma including the utilization of endoscopic modalities, radiation therapy, chemotherapy, and surgery.  The specific sequence and timing of these therapeutic modalities varies and is tailored to individual patients based upon presentation, histology, tumor stage, and comorbid conditions.  Surgical resection offers the only significant potential for cure of gastric adenocarcinoma.  4. I have briefly discussed the dietary alterations that will be necessary post-operatively.  While the extent of change shall be determined by the size of gastric remnant, most patients should ingest small but frequent meals, high in protein and inclusive of fat, approximately six time per day.  Foods should be eaten slowly while upright and chewed well.  Simple carbohydrates (such as sugars or sweets) can contribute to dumping syndrome and should be avoided.  Finally, supplementation with B12, fat soluble vitamins, calcium and iron may be necessary.  5. We discussed the chance of recurrence of the tumor in both the same and new locations, which can occur anywhere in their body at any time.  Thus, all patients should be followed up systematically for the remainder of their lifetime.  Per NCCN guidelines, follow-up should consist of repeat clinical examination every 3-6 months for 2 years, every 6-12 months until 5 years, and annually thereafter.  Laboratory testing, radiographic imaging, and endoscopy are not routinely recommended, but reserved for clear clinical indications.   6. I have discussed her comorbid conditions and how they may impact surgical healing or further therapy, including her sarcoidosis.  Over time, these lesions shall continue to present problems with followup for recurrence or new lesions. It may also be that this is causing the majority of her symptomatology and joint pain. I have recommended that she see a physician specifically for this disorder.  7. I have given her a prescription for some short acting pain medications. However given that she is distant from her surgery and should no longer be having surgical pain (she is in fact having most of her symptoms and pain in the lower extremities not in her abdomen), I have told her that I shall no longer prescribe her pain medications under any circumstance. I have also counseled her that taking pain medications from multiple physicians is often a red flag and a concerning sign throughout the medical community.  8. I will ask that she return in 3 months for follow-up and repeat clinical examination.  At that time, we may repeat cross-sectional imaging depending upon clinical circumstances.  9. All of the patient's and her family member's questions were answered to best of my ability and to their satisfaction. They seem to  understand the disease process and the above mentioned treatment algorithm. After deliberation, she agrees to proceed as outlined.  10. The patient has my business card containing all of my professional contact information and I have instructed her to call with new or worsening symptoms, questions, or concerns.  11. The total time for this visit was 40 minutes and of that, greater than 50% was dedicated to the discussion of her disease and follow-up plan, with multiple phone calls to other providors.    [name], MD 11/22/2012 6:15 PM  Assistant Professor, Division of Surgical Oncology  [facility]

## 2012-11-24 ENCOUNTER — Encounter (HOSPITAL_BASED_OUTPATIENT_CLINIC_OR_DEPARTMENT_OTHER): Payer: Self-pay

## 2013-02-16 ENCOUNTER — Encounter (INDEPENDENT_AMBULATORY_CARE_PROVIDER_SITE_OTHER): Payer: Self-pay | Admitting: SURGICAL ONCOLOGY

## 2013-02-16 ENCOUNTER — Ambulatory Visit (HOSPITAL_BASED_OUTPATIENT_CLINIC_OR_DEPARTMENT_OTHER): Payer: MEDICAID | Admitting: SURGICAL ONCOLOGY

## 2013-02-16 ENCOUNTER — Ambulatory Visit (HOSPITAL_BASED_OUTPATIENT_CLINIC_OR_DEPARTMENT_OTHER)
Admission: RE | Admit: 2013-02-16 | Discharge: 2013-02-16 | Disposition: A | Discharge status: Home / Self Care | Payer: MEDICAID | Source: Ambulatory Visit | Admitting: Hematology & Oncology

## 2013-02-16 ENCOUNTER — Ambulatory Visit
Admission: RE | Admit: 2013-02-16 | Discharge: 2013-02-16 | Disposition: A | Discharge status: Home / Self Care | Payer: MEDICAID | Source: Ambulatory Visit | Attending: Hematology & Oncology | Admitting: Hematology & Oncology

## 2013-02-16 ENCOUNTER — Ambulatory Visit (INDEPENDENT_AMBULATORY_CARE_PROVIDER_SITE_OTHER)
Admission: RE | Admit: 2013-02-16 | Discharge: 2013-02-16 | Disposition: A | Discharge status: Home / Self Care | Payer: MEDICAID | Source: Ambulatory Visit | Attending: Hematology & Oncology | Admitting: Hematology & Oncology

## 2013-02-16 ENCOUNTER — Encounter (HOSPITAL_BASED_OUTPATIENT_CLINIC_OR_DEPARTMENT_OTHER): Payer: Self-pay | Admitting: Hematology & Oncology

## 2013-02-16 VITALS — BP 138/92 | Temp 98.8°F | Ht 61.0 in | Wt 132.9 lb

## 2013-02-16 DIAGNOSIS — Z86711 Personal history of pulmonary embolism: Secondary | ICD-10-CM | POA: Insufficient documentation

## 2013-02-16 DIAGNOSIS — Z86718 Personal history of other venous thrombosis and embolism: Secondary | ICD-10-CM | POA: Insufficient documentation

## 2013-02-16 DIAGNOSIS — D869 Sarcoidosis, unspecified: Secondary | ICD-10-CM | POA: Insufficient documentation

## 2013-02-16 DIAGNOSIS — F3289 Other specified depressive episodes: Secondary | ICD-10-CM | POA: Insufficient documentation

## 2013-02-16 DIAGNOSIS — G43909 Migraine, unspecified, not intractable, without status migrainosus: Secondary | ICD-10-CM | POA: Insufficient documentation

## 2013-02-16 DIAGNOSIS — C169 Malignant neoplasm of stomach, unspecified: Secondary | ICD-10-CM | POA: Insufficient documentation

## 2013-02-16 DIAGNOSIS — Z8673 Personal history of transient ischemic attack (TIA), and cerebral infarction without residual deficits: Secondary | ICD-10-CM | POA: Insufficient documentation

## 2013-02-16 DIAGNOSIS — I10 Essential (primary) hypertension: Secondary | ICD-10-CM | POA: Insufficient documentation

## 2013-02-16 DIAGNOSIS — Z809 Family history of malignant neoplasm, unspecified: Secondary | ICD-10-CM | POA: Insufficient documentation

## 2013-02-16 DIAGNOSIS — R599 Enlarged lymph nodes, unspecified: Secondary | ICD-10-CM | POA: Insufficient documentation

## 2013-02-16 DIAGNOSIS — R911 Solitary pulmonary nodule: Secondary | ICD-10-CM | POA: Insufficient documentation

## 2013-02-16 DIAGNOSIS — Z903 Acquired absence of stomach [part of]: Secondary | ICD-10-CM | POA: Insufficient documentation

## 2013-02-16 LAB — BLOOD CELL COUNT W/DIFF - CANCER CENTER
BASOPHILS: 0 %
BASOS ABS: 0.007 10*3/uL (ref 0.0–0.2)
EOS ABS: 0.043 THOU/uL (ref 0.0–0.5)
EOSINOPHIL: 1 %
HCT: 31.4 % — ABNORMAL LOW (ref 33.5–45.2)
HGB: 10.1 g/dL — ABNORMAL LOW (ref 11.2–15.2)
LYMPHOCYTES: 13 %
LYMPHS ABS: 1 10*3/uL (ref 1.0–4.8)
MCH: 28.2 pg (ref 27.4–33.0)
MCHC: 32.2 g/dL — ABNORMAL LOW (ref 32.5–35.8)
MCV: 87.6 fL (ref 78–100)
MONOCYTES: 9 %
MONOS ABS: 0.701 10*3/uL (ref 0.3–1.0)
MPV: 8 fL (ref 7.5–11.5)
PLATELET COUNT (AUTO): 160 THOU/uL (ref 140–450)
PLATELET COUNT: 160 THOU/uL (ref 140–450)
PMN ABS (AUTO): 5.789 THOU/uL (ref 1.5–7.7)
PMN ABS: 5.789 10*3/uL (ref 1.5–7.7)
PMN'S: 77 %
RBC: 3.58 MIL/uL — ABNORMAL LOW (ref 3.63–4.92)
RDW: 20 % — ABNORMAL HIGH (ref 12.0–15.0)
WBC: 7.5 THOU/uL (ref 3.5–11.0)

## 2013-02-16 LAB — CARBON DIOXIDE (CO2, BICARBONATE)
ANION GAP: 5 mmol/L (ref 4–13)
CARBON DIOXIDE: 25 mmol/L (ref 22–32)

## 2013-02-16 LAB — BILIRUBIN TOTAL: BILIRUBIN, TOTAL: 0.6 mg/dL (ref 0.3–1.3)

## 2013-02-16 LAB — POTASSIUM: POTASSIUM: 3.6 mmol/L (ref 3.5–5.1)

## 2013-02-16 LAB — LDH: LDH: 161 U/L (ref 125–220)

## 2013-02-16 LAB — PERFORM POC ISTAT CREATININE POINT OF CARE: CREATININE, POC: 0.8 mg/dL (ref 0.49–1.10)

## 2013-02-16 LAB — ALK PHOS (ALKALINE PHOSPHATASE): ALKALINE PHOSPHATASE: 89 U/L (ref <150)

## 2013-02-16 LAB — AST (SGOT): AST (SGOT): 31 U/L (ref 8–45)

## 2013-02-16 LAB — SODIUM: SODIUM: 136 mmol/L (ref 136–145)

## 2013-02-16 LAB — ALT (SGPT): ALT (SGPT): 11 U/L (ref <55)

## 2013-02-16 LAB — CREATININE WITH EGFR: CREATININE: 0.67 mg/dL (ref 0.49–1.10)

## 2013-02-16 LAB — CHLORIDE: CHLORIDE: 106 mmol/L (ref 96–111)

## 2013-02-16 LAB — CREATININE: ESTIMATED GLOMERULAR FILTRATION RATE: >59 mL/min/{1.73_m2} (ref >59)

## 2013-02-16 LAB — PERFORM POC WHOLE BLOOD GLUCOSE: GLUCOSE, POINT OF CARE: 80 mg/dL (ref 70–105)

## 2013-02-16 LAB — CARCINOEMBRYONIC ANTIGEN: CARCINOEMBRYONIC AG: 1 ng/mL (ref <5.0)

## 2013-02-16 MED ORDER — ATENOLOL 50 MG TABLET
50.0000 mg | ORAL_TABLET | Freq: Every day | ORAL | Status: DC
Start: 2013-02-16 — End: 2014-01-11

## 2013-02-16 MED ADMIN — iopamidoL 300 mg iodine/mL (61 %) intravenous solution: 100 mL | INTRAVENOUS | @ 10:00:00 | NDC 00270131535

## 2013-02-16 MED ADMIN — diatrizoate meglumine and diat.sodium 66 %-10 % oral solution: 10 mL | ORAL | NDC 00270044535

## 2013-02-16 NOTE — Nurses Notes (Signed)
POWER PORT ACCESSED UNDER STERILE TECHNIQUE FOR PET/CT SCAN; FLUSHED WITHOUT DIFFICULTY AND BLOOD RETURN PRESENT.  PORT INFUSED WITH 1mL FDG, 50mL NS AND ISOVUE.  SITE WNL AND DRESSING CLEAN AND DRY.  PORT REMAINED ACCESSED AND PT SENT TO [facility]ER CENTER LAB FOR BLOOD DRAW.

## 2013-02-16 NOTE — Cancer Center Note (Addendum)
[facility]   [place] [facility]    Date: 02/16/2013  Name: Michele Mason  MRN: [id number]  Primary Care Provider: No Established Pcp    REASON FOR VISIT: 37 y.o.female for evaluation and management of gastric cancer.  HISTORY OF PRESENT ILLNESS:   07/04/12. Patient was seen by Dr. Concha Se in [place] and underwent a colonoscopy and EGD and the mass was biopsied from the antrum with report of a highly suspicious for invasive adenocarcinoma of the stomach with partial obstruction. She was transferred to [facility].   07/19/12: Exploratory laparotomy by Dr. Leota Sauers, lysis of adhesions, prior abdominal scar excision. Distal gastrectomy and D2 lymphadenectomy. Lymph-vascular invasion: Not identified. Pathologic staging: pT3N0MX. Her-2 testing negative by IHC and FISH.  3. Recommended postoperative adjuvant chemoradiation as per [id number] data and referred to local medical oncologist Dr. Tobin Chad and patient subsequently changed her care to Dr. Deretha Emory. Radiation onc consult was done by Dr. Joslyn Devon and started on postoperative XRT.   4. 10/20/12: Bronch with EBUS and FNA of mediastinal nodes showed small clusters of epithelioid histiocytes are noted, consistent with sarcoidosis.    Michele Mason is 37 y.o. female who is here for follow up. She has been having abdominal cramping since early this morning. She rates her pain at 8/10 on the numeric scale. Otherwise, no complaints.     REVIEW OF SYSTEMS:  General: no fever, night sweats, or unintentional weight loss, no fatigue   Eyes: no vision loss, no diplopia   ENT: no sore throat, no lumps   Cardiovascular: no chest pain, no dyspnea   Respiratory: no shortness of breath, no wheezing   GI: + abdominal pain, no changes in BMs, no melena/bleeding   GU: no hematuria, no flank pain   Skin: no rashes, no lumps    Musculoskeletal: no new pain, no swelling of extremities    Neurologic: no current focal deficits, no headache  Hematologic: no bleeding, no excessive bruising   Endocrine: no hot flashes   Psych: no depression, affect appropriate      PAST MEDICAL HISTORY:  Past Medical History   Diagnosis Date   . Moderate protein-calorie malnutrition 07/10/2012   . Gastric cancer 07/10/2012     per patient stage 3   . Chronic pain disorder 07/10/2012   . Sarcoidosis 07/10/2012   . Palpitations    . Nutritional disorder    . Weight loss, abnormal    . Anemia    . DVT (deep vein thrombosis) in pregnancy 2001   . Anemia, chronic disease 07/23/2012   . De Quervain's tenosynovitis, left 07/23/2012   . Crohn disease 07/23/2012     Pt was treated for Crohn's for 10 years, has not had any relapses and no steroids for more than a decade.   . Personal history of PE (pulmonary embolism) 07/23/2012   . Anxiety 07/23/2012   . Clot    . Essential hypertension    . Cough    . Shortness of breath      walking up steps and has shortness of breath , walking 20 feet on the flat   . Heart murmur      states 37 years of age   . Dysrhythmias      tachycardia   . Hypertension    . History of transfusion      no reaction   . Pulmonary embolism 2001   . CVA (cerebrovascular accident)  left side weakness   . Peripheral neuropathy    . Migraine    . Depression    . Contact dermatitis and other eczema, due to unspecified cause      rash in the chest that is resolving   . Bruises easily    . ITP (idiopathic thrombocytopenic purpura)    . Decorative tattoo    . Back problem    . Neck problem    . GERD (gastroesophageal reflux disease)    . Ear piercing    . Decorative tattoo      both arms, back, R leg and R chest     ALLERGIES:  Allergies   Allergen Reactions   . Adhesive Rash and Itching     MEDICATIONS:  Current Outpatient Prescriptions   Medication Sig   . albuterol sulfate (PROVENTIL OR VENTOLIN) 90 mcg/actuation Inhalation HFA Aerosol Inhaler Take 1-2 Puffs by inhalation Every 6 hours as needed    . ALPRAZolam (XANAX) 2 mg Oral Tablet Take 2 mg by mouth Every night as needed for Insomnia   . atenolol (TENORMIN) 50 mg Oral Tablet Take 1 Tab (50 mg total) by mouth Once a day Patient states she only takes it as needed.   . budesonide-formoterol (SYMBICORT) 160-4.5 mcg/actuation Inhalation HFA Aerosol Inhaler Take 2 Puffs by inhalation Twice daily   . ondansetron (ZOFRAN) 4 mg Oral Tablet Take 1 Tab (4 mg total) by mouth Every 8 hours as needed for nausea/vomiting   . oxyCODONE (OXYCONTIN) 20 mg Oral Tablet Sustained Release 12 hr Take 1 Tab (20 mg total) by mouth Every 12 hours   . oxyCODONE (ROXICODONE) 15 mg Oral Tablet Take 1 Tab (15 mg total) by mouth Every 6 hours as needed for Pain   . Ranitidine HCl (ZANTAC) 300 mg Oral Tablet Take 1 Tab (300 mg total) by mouth Every evening     Family History   Problem Relation Age of Onset   . Sarcoidosis Mother    . Hepatitis B Mother      Hepatitis c   . Heart Attack Father    . Healthy Brother    . Breast Cancer Paternal Aunt 61     breast cancer   . Cancer Paternal Grandmother 40     brain tumor   . Colon Cancer Paternal Grandfather 44     colon cancer         PHYSICAL EXAMINATION:  Vitals:  Most Recent Vitals      Office Visit from 02/16/2013 in Oncology Surgery-POC    Temperature 37.1 C (98.8 F) filed at... 02/16/2013 1129    Heart Rate     Respiratory Rate     BP (Non-Invasive) ! 138/92 mmHg [BP RUE] filed at... 02/16/2013 1129    Height 1.549 m (5\' 1" ) filed at... 02/16/2013 1129    Weight 60.3 kg (132 lb 15 oz) filed at... 02/16/2013 1129    BMI (Calculated)     BSA (Calculated)             PHYSICAL EXAMINATION:  ECOG PS 0 - Fully active, able to carry on all pre-disease performance without restriction.     General survey: NAD, well groomed  HEENT: Normocephalic. EOMI.   Lymphatics: No cervical or supraclavicular lymphadenopathy.    Chest: Clear to auscultation bilaterally, no wheezes.   Cardiac: RRR. No murmurs.   Extremities: No tenderness, no swelling    Skin: No rashes.  Neurologic: No gross focal deficits, A&Ox3  Psych: Affect  is appropriate, no depression      LABORATORY:  Results for orders placed during the hospital encounter of 02/16/13 (from the past 36 hour(s))   ALK PHOS (ALKALINE PHOSPHATASE)       Result Value Range    ALKALINE PHOSPHATASE 89  <150 U/L   ALT (SGPT)       Result Value Range    ALT (SGPT) 11  <55 U/L   AST (SGOT)       Result Value Range    AST (SGOT) 31  8 - 45 U/L   BILIRUBIN TOTAL       Result Value Range    BILIRUBIN, TOTAL 0.6  0.3 - 1.3 mg/dL   BLOOD CELL COUNT W/DIFF - CANCER CENTER       Result Value Range    WBC 7.5  3.5 - 11.0 THOU/uL    RBC 3.58 (*) 3.63 - 4.92 MIL/uL    HGB 10.1 (*) 11.2 - 15.2 g/dL    HCT 60.4 (*) 54.0 - 45.2 %    MCV 87.6  78 - 100 fL    MCH 28.2  27.4 - 33.0 pg    MCHC 32.2 (*) 32.5 - 35.8 g/dL    RDW 98.1 (*) 19.1 - 15.0 %    PLATELET COUNT 160  140 - 450 THOU/uL    MPV 8.0  7.5 - 11.5 fL    PMN'S 77      PMN ABS 5.789  1.5 - 7.7 THOU/uL    LYMPHOCYTES 13      LYMPHS ABS 1.000  1.0 - 4.8 THOU/uL    MONOCYTES 9      MONOS ABS 0.701  0.3 - 1.0 THOU/uL    EOSINOPHIL 1      EOS ABS 0.043  0.0 - 0.5 THOU/uL    BASOPHILS 0      BASOS ABS 0.007  0.0 - 0.2 THOU/uL    PMN ABS 5.789  1.5 - 7.7 THOU/uL    PLATELET COUNT 160  140 - 450 THOU/uL   CARCINOEMBRYONIC ANTIGEN       Result Value Range    CARCINOEMBRYONIC AG 1.0  <5.0 ng/mL   CREATININE       Result Value Range    CREATININE 0.67  0.49 - 1.10 mg/dL    ESTIMATED GLOMERULAR FILTRATION RATE >59  >59 ml/min/1.70m2   LDH       Result Value Range    LDH 161  125 - 220 U/L   CARBON DIOXIDE (CO2, BICARBONATE)       Result Value Range    CARBON DIOXIDE 25  22 - 32 mmol/L    ANION GAP 5  4 - 13 mmol/L   CHLORIDE       Result Value Range    CHLORIDE 106  96 - 111 mmol/L   POTASSIUM       Result Value Range    POTASSIUM 3.6  3.5 - 5.1 mmol/L   SODIUM       Result Value Range    SODIUM 136  136 - 145 mmol/L       IMAGING:   I have visualized the imaging studies I reviewed the findings with the patient.    PET CT 02/16/13  IMPRESSION:   1. Reduced size and slightly reduced metabolic activity of all previously seen hypermetabolic lymph nodes in the chest.   2. Focal nodular opacity in the right upper lobe, seen on prior study, is significantly smaller and  no longer metabolically active.   3. No new PET/CT evidence suggestive of active malignancy or acute abnormalities.       ASSESSMENT:   1.  37 yo female with T3N0Mx gastric cancer.  She is s/p distal gastrectomy and D2 lymphadenectomy.  Tumor size 4.2 x 3.5 x 3.0 cm.  Adenocarcinoma, intestinal type.  Gr 2 (moderately differentiated)  PT3N0Mx. Her-2 negative by IHC and FISH.  She is currently getting adjuvant chemoRT closer to home.  2. PMH of chronic pain, sarcoidosis, multiple abdominal surgeries, and possible crohn's disease  3. Mediastinal hilar adenopathy and RUL nodule on PET CT. EBUS Bx 10/20/12 confirmed sarcoidosis.  4. Family history of multiple cancers. Patient had genetic counseling and no genetic testing was recommended.   5. History of migraine headaches.    PLAN:   1. Patient finished chemo and radiation in July. She states that she had a bad reaction to the chemotherapy so she only completed half of it.   2. Patient has a CT scan planned locally by oncologists for 6 months follow up in January 2015.  3. RTC PRN. She will follow up closely with her local medical and radiation oncologists.    Patient was seen as a shared visit with Dr. Allene Dillon, RN, MSN, NP-C  [facility]  [facility]    I personally saw and examined the patient. See Nurse Practitioner's note for additional details. I have visualized the PET/CT and I reviewed the findings with the patient. She will continue to follow with her primary oncologist locally and will have repeat scans done there for future follow up.    Threasa Alpha, MD

## 2013-02-24 NOTE — Progress Notes (Signed)
SURGICAL ONCOLOGY FOLLOW-UP EVALUATION:    PATIENT:  Michele Mason  MRN:  [id number]  DOB:   Aug 15, 1975  DATE:  02/16/2013    CANCER DIAGNOSIS AND STAGE:   1. Stage IIA, pT3 pN0 cM0, intestinal type gastric cancer  2. Sarcoidosis  3. Hx of Crohn's disease    SURGICAL THERAPY:   1. 07/19/2012 - exploratory laparotomy, lysis of adhesions, subtotal gastrectomy, D2 lymphadenectomy, umbilical hernia repair  2. 07/24/2012 - R IJ port    SYSTEMIC THERAPY: 2 weeks of 5-FU (discontinued due to toxicity of the skin) by Dr. Nanci Pina [facility], [facility])    RADIATION THERAPY: 11/09/2012 - 12/28/2012, unknown total dose (presumably 50.4 Gy) by Dr. Molli Barrows [facility])    CLINICAL TRIAL: None.    CHIEF COMPLAINT: Routine surveillance.    HISTORY OF PRESENT ILLNESS:   Michele Mason is a 37 y.o. Black/African American female who returns to clinic today for routine surveillance following subtotal gastrectomy with D2 lymphadenectomy on 07/19/2012 for intestinal type gastric cancer.  The patient was last seen in this clinic on 11/10/2012 and since that time has undergone full radiation therapy in 2 weeks of 5-FU chemotherapy which was discontinued due to toxicity. Today she presents without complaints.  In fact, this is the best I have seen her in quite some time. Her mood is quite improved, she feels quite well, she is eating well and having regular bowel function, and her overall pain level is improved. During today's visit, she denies drainage, redness/inflammation or tenderness of any past surgical incision or drain sites.  In addition, the patient denies palpation of any new masses or adenopathy and does not have unexplained weight loss.  The patient's main questions and concerns at this time are related to continued mild abdominal pain at her incision site below the umbilicus which has been shown to have a small herniation. She also is requesting medication refill of her blood pressure medication. She is quite happy with her medical oncologist and radiation oncologist who she feels is been treating her well and was then she has developed a good relationship and rapport. She underwent PET/CT today for restaging purposes. As usual, she is accompanied by her father who helps to provide some of the above-mentioned history.    REVIEW OF SYSTEMS:  Constitutional: Endorses skin blistering and pain with chemotherapy; pleasant, cooperative, denies fevers, chills or weight loss  HEENT: denies headache, dizziness, double vision, tinnitus, nasal congestion, or difficulty swallowing  Cardiovascular: denies chest pain, palpitations, dyspnea on exertion  Respiratory: denies SOB, cough   Gastrointestinal (GI): Endorses mild abdominal pain at the umbilicus; denies nausea, vomiting, constipation, diarrhea  Genitourinary (GU): denies dysuria, hematuria  Gynecologic (GYN): denies vaginal discharge, abnormal vaginal bleeding  Musculoskeletal: denies weakness, joint pain  Endocrine: denies polydipsia, polyuria  Neurological: denies dizziness, facial droop, focal motor/sensory deficits  Psychiatric: denies anxiety, depression, sleeping disturbances    PAST MEDICAL, PAST SURGICAL, FAMILY, AND SOCIAL HISTORY:  The patient denies any changes to this portion of her history since her visit on 11/10/2012.    HOME MEDICATIONS:  albuterol sulfate;  ALPRAZolam;  atenolol;  budesonide-formoterol;  ondansetron;  oxyCODONE;  oxyCODONE;  Ranitidine HCl    ALLERGIES:  Allergies   Allergen Reactions   . Adhesive Rash and Itching     PHYSICAL EXAMINATION:  ECOG Performance Status: 0 - Asymptomatic  Karnofsky Performance Status: 100% - normal, no complaints, no signs of disease  General: pleasant, well-developed, well-nourished Black/African American female who  is in no acute distress.    Vital Signs: BP 138/92   Temp(Src) 37.1 C (98.8 F) (Thermal Scan)   Ht 1.549 m (5\' 1" )   Wt 60.3 kg (132 lb 15 oz)   BMI 25.13 kg/m2  Neck: supple, trachea is midline, thyroid is symmetric, not enlarged, and without nodularity  Lymphatics: no palpable lymphadenopathy of the cervical, supraclavicular, axillary, or inguinal nodal basins bilaterally  Cardiovascular: regular rate and rhythm, S1, S2 normal, no murmur, click, rub or gallop  Pulmonary: normal effort, chest expands symmetrically, lungs are clear to auscultation bilaterally.   Abdomen: non-distended, normal bowel sounds, no bruits, soft, nontender, no organomegaly or masses  Extremities: without deformity, cyanosis, or edema  Neurologic: alert and oriented to person, place, time, and situation, cranial nerves grossly intact, no focal motor or sensory deficits   Psychiatric: speech pattern and movements are normal, normal mood, affect and judgment    LABORATORY REVIEW (I have personally reviewed all recent laboratory studies):  1. 02/16/2013 from [facility]: WBC 7.5, Hgb 10.1, plt 160, with normal MCV and differential. Na 136, K 3.6, Cl 106, bicarbonate 25, creatinine 0.67.  Total bilirubin 0.6, alkaline phosphatase 89, AST 31, ALT 11, LDH 161.  CEA 1.0.  2. 11/10/2012 from [facility]: WBC 6.3, Hgb 10.2, plt 140, with normal MCV and differential. Creatinine 0.74. Total bilirubin 0.9, alkaline phosphatase 87, AST 17, ALT 6. CEA 0.9 (pre-op was 1.4).    IMAGING REVIEW (I have personally reviewed both the reports and images):  1. 02/16/2013 PET/CT from [facility]: Reduced size and slightly reduced metabolic activity of all previously seen hypermetabolic lymph nodes in the chest.  Focal nodular opacity in the right upper lobe, seen on prior study, is significantly smaller and no longer metabolically active.  No new PET/CT evidence suggestive of active malignancy or acute abnormalities.  2. 09/04/2012 PET/CT from [facility]: Right upper lobe pulmonary nodule question metastatic disease. Mediastinal hilar adenopathy which may represent malignancy. Upper abdominal area with multiple surgical clips consistent with prior surgical procedure. Right neck lymph node likely reactive but malignancy cannot be excluded.  3. 10/20/2012 CXR from [facility]: No acute cardiopulmonary process. No visible pneumothorax. Postoperative changes.    PATHOLOGY REVIEW:  No new path.    ASSESSMENT:  37 y.o. Black/African American female with stage IIA, pT3 pN0 cM0, intestinal type gastric cancer status post subtotal gastrectomy with D2 lymphadenectomy and adjuvant chemoradiation. Restaging PET/CT from today demonstrates no evidence of recurrence (NED).    PLAN:  1. The patient's examination and imaging were discussed with her in detail today. She is doing very well with no evidence of disease at this time.   2. Once again, I have briefly discussed the dietary alterations that will be necessary post-operatively.  While the extent of change shall be determined by the size of gastric remnant, most patients should ingest small but frequent meals, high in protein and inclusive of fat, approximately six time per day.  Foods should be eaten slowly while upright and chewed well.  Simple carbohydrates (such as sugars or sweets) can contribute to dumping syndrome and should be avoided.  Finally, supplementation with B12, fat soluble vitamins, calcium and iron may be necessary.  3. We discussed the chance of recurrence of the tumor in both the same and new locations, which can occur anywhere in their body at any time.  Thus, all patients should be followed up systematically for the remainder of their lifetime.  Per NCCN guidelines, follow-up should consist of repeat clinical  examination every 3-6 months for 2 years, every 6-12 months until 5 years, and annually thereafter.  Laboratory testing, radiographic imaging, and endoscopy are not routinely recommended, but reserved for clear clinical indications.  4. I have discussed her comorbid conditions and how they may impact surgical healing or further therapy, including sarcoidosis.  5. I continue to recommend that she consult a specialist and sarcoidosis for further management as this may also explain some of her joint and chronic pain.  6. I will ask that she return to this clinic in 3 months for follow-up and repeat clinical examination.  7. All of the patient's and her family member's questions were answered to best of my ability and to their satisfaction. They seem to understand the disease process and the above mentioned treatment algorithm. After deliberation, she agrees to proceed as outlined.  8. The patient has my business card containing all of my professional contact information and I have instructed her to call with new or worsening symptoms, questions, or concerns.   9. The total time for this visit was 15 minutes and of that, greater than 50% was dedicated to the discussion of her disease and follow-up plan.    [name], MD 02/24/2013 1:17 PM  Assistant Professor of Surgery  Division of Surgical Oncology  [facility]

## 2013-03-08 ENCOUNTER — Encounter (HOSPITAL_BASED_OUTPATIENT_CLINIC_OR_DEPARTMENT_OTHER): Payer: Self-pay

## 2013-03-08 NOTE — Progress Notes (Signed)
Late entry for 02/19/2013:   MSW authorized financial assistance through the [employer] for nutritional supplements purchased through the [employer] Program at [facility].     Gwyndolyn Saxon, MSW

## 2013-05-18 ENCOUNTER — Encounter (INDEPENDENT_AMBULATORY_CARE_PROVIDER_SITE_OTHER): Payer: Self-pay | Admitting: SURGICAL ONCOLOGY

## 2013-07-06 ENCOUNTER — Ambulatory Visit: Payer: MEDICAID | Attending: SURGICAL ONCOLOGY | Admitting: SURGICAL ONCOLOGY

## 2013-07-06 VITALS — BP 120/80 | HR 91 | Temp 98.6°F | Ht 61.5 in | Wt 129.6 lb

## 2013-07-06 DIAGNOSIS — C169 Malignant neoplasm of stomach, unspecified: Secondary | ICD-10-CM | POA: Insufficient documentation

## 2013-07-16 NOTE — Progress Notes (Signed)
SURGICAL ONCOLOGY FOLLOW-UP EVALUATION:    PATIENT:  Michele Mason  MRN:  [id number]  DOB:   [birth year]  DATE:  07/06/2013    CANCER DIAGNOSIS AND STAGE:   1. Stage IIA, pT3 pN0 cM0, intestinal type gastric cancer  2. Sarcoidosis  3. Hx of Crohn's disease    SURGICAL THERAPY:   1. 07/19/2012 - exploratory laparotomy, lysis of adhesions, subtotal gastrectomy, D2 lymphadenectomy, umbilical hernia repair  2. 07/24/2012 - R IJ port    SYSTEMIC THERAPY: 2 weeks of 5-FU (discontinued due to toxicity of the skin) by Dr. Frutoso Chase [facility])     RADIATION THERAPY: 11/09/2012 - 12/28/2012, unknown total dose (presumably 50.4 Gy) by Dr. Adah Salvage [facility], [facility])     CLINICAL TRIAL: None.     CHIEF COMPLAINT: Routine surveillance.    HISTORY OF PRESENT ILLNESS:  Michele Mason is a 38 y.o. 38 American female who returns to clinic today for routine surveillance following subtotal gastrectomy on 07/19/2012 for T3 gastric cancer.  The patient was last seen in this clinic on 02/16/2013 and today she presents with complaints of left upper quadrant bowel pain that appears worse after eating. She is otherwise in her usual state of health and states ejection he feels relatively well over the last several days. She has had attacks of sarcoidosis which is being treated with steroid therapy and she does feel improvement. She otherwise is able to eat relatively well and is having normal bowel function. She was seen over the Christmas holidays for abdominal pain in the emergency department at [facility] where CT scan was performed demonstrating no acute changes.  During today's visit, she denies drainage, redness/inflammation or tenderness of any past surgical incision or drain sites.  In addition, the patient denies palpation of any new masses or adenopathy and does not have unexplained weight loss.  The patient's main questions and concerns at this time are related to her left upper  quadrant abdominal pain. As always, she is accompanied in the office today by her father who helps provide some of the above-mentioned history.    REVIEW OF SYSTEMS:  Constitutional: pleasant, cooperative, denies fevers, chills or weight loss  HEENT: denies headache, dizziness, double vision, tinnitus, nasal congestion, or difficulty swallowing  Cardiovascular: denies chest pain, palpitations, dyspnea on exertion  Respiratory: denies SOB, cough  Gastrointestinal (GI): denies abdominal pain, nausea, vomiting, constipation, diarrhea  Genitourinary (GU): denies dysuria, hematuria  Gynecologic (GYN): denies vaginal discharge, abnormal vaginal bleeding  Musculoskeletal: denies weakness, joint pain  Endocrine: denies polydipsia, polyuria  Neurological: denies dizziness, facial droop, focal motor/sensory deficits  Psychiatric: denies anxiety, depression, sleeping disturbances    PAST MEDICAL, PAST SURGICAL, FAMILY, AND SOCIAL HISTORY:  The patient denies any changes to this portion of her history since her visit on 02/16/2013.    HOME MEDICATIONS:  albuterol sulfate;  ALPRAZolam;  atenolol;  budesonide-formoterol;  Ipratropium-Albuterol;  levofloxacin;  ondansetron;  oxyCODONE;  oxyCODONE;  predniSONE;  promethazine-codeine;  Ranitidine HCl    ALLERGIES:  Allergies   Allergen Reactions    Adhesive Rash and Itching     PHYSICAL EXAMINATION:  ECOG Performance Status: 1 - Symptomatic but completely ambulatory   Karnofsky Performance Status: 90% - capable of normal activity, few symptoms or signs of disease  General: pleasant, well-developed, well-nourished 38 American female who is in no acute distress.    Vital Signs: BP 120/80   Pulse 91   Temp(Src) 37 C (98.6 F) (Thermal Scan)  Ht 1.562 m (5' 1.5")   Wt 58.8 kg (129 lb 10.1 oz)   BMI 24.1 kg/m2   SpO2 98%  Neck: supple, trachea is midline, thyroid is symmetric, not enlarged, and without nodularity  Lymphatics: no palpable lymphadenopathy of the cervical,  supraclavicular, axillary, or inguinal nodal basins bilaterally  Cardiovascular: regular rate and rhythm, S1, S2 normal, no murmur, click, rub or gallop  Pulmonary: normal effort, chest expands symmetrically, lungs are clear to auscultation bilaterally.   Abdomen: non-distended, normal bowel sounds, no bruits, soft, nontender, no organomegaly or masses  Extremities: without deformity, cyanosis, or edema  Neurologic: alert and oriented to person, place, time, and situation, cranial nerves grossly intact, no focal motor or sensory deficits  Psychiatric: speech pattern and movements are normal, normal mood, affect and judgment    LABORATORY REVIEW (I have personally reviewed all recent laboratory studies):  None.    IMAGING REVIEW (I have personally reviewed both the reports and images):  1. 06/08/2013 CT abdomen and pelvis with contrast from [facility]: There is a small 4-5 mm subpleural nodular density in the inferior right middle lobe. Minimal areas of scarring in her atelectasis are seen. No pleural effusions. Surgical changes in the regions of the stomach with a gastrojejunostomy. No evidence of bowel obstruction. No free fluid. Fatty changes of the liver. Pancreatic body and tail are somewhat small in size. No enlarged lymphadenopathy. No aneurysmal dilatation of the aorta. There is a tiny fat-containing ventral hernia.  2. 02/16/2013 PET/CT from [facility]: Reduced size and slightly reduced metabolic activity of all previously seen hypermetabolic lymph nodes in the chest. Focal nodular opacity in the right upper lobe, seen on prior study, is significantly smaller and no longer metabolically active. No new PET/CT evidence suggestive of active malignancy or acute abnormalities.  3. 10/20/2012 CXR from [facility]: No acute cardiopulmonary process. No visible pneumothorax. Postoperative changes.  4. 09/04/2012 PET/CT from [facility]: Right upper lobe pulmonary nodule question metastatic disease. Mediastinal hilar  adenopathy which may represent malignancy. Upper abdominal area with multiple surgical clips consistent with prior surgical procedure. Right neck lymph node likely reactive but malignancy cannot be excluded.    PATHOLOGY REVIEW:  None.    ASSESSMENT:  38 y.o. 41 American female with stage IIA, pT3 pN0 cM0, intestinal type gastric cancer.    PLAN:  1. The patient's examination and imaging were discussed with her in detail today.  She has no evidence of recurrence.  2. I have recommended repeat EGD given her left upper quadrant abdominal pain with eating. There is potential for marginal ulcer or other pathology at her anastomosis. She has an endoscopist closer to home and states that she will arrange for these tests and will give me the reports.  3. I have briefly discussed the dietary alterations that will be necessary post-operatively.  While the extent of change shall be determined by the size of gastric remnant, most patients should ingest small but frequent meals, high in protein and inclusive of fat, approximately six time per day.  Foods should be eaten slowly while upright and chewed well.  Simple carbohydrates (such as sugars or sweets) can contribute to dumping syndrome and should be avoided.  Finally, supplementation with B12, fat soluble vitamins, calcium and iron may be necessary. After discussions with our [place] dieticians, I personally recommend the Equate Once Daily Woman's Multivitamin as it contains nearly the entire daily recommended amounts of all of the above vitamins and minerals.  4. We discussed the chance  of recurrence of the tumor in both the same and new locations, which can occur anywhere in their body at any time.  Thus, all patients should be followed up systematically for the remainder of their lifetime.  Per NCCN guidelines, follow-up should consist of repeat clinical examination every 3-6 months for 2 years, every 6-12 months until 5 years, and annually thereafter.  Laboratory  testing, radiographic imaging, and endoscopy are not routinely recommended, but reserved for clear clinical indications.  5. I have discussed her comorbid conditions and how they may impact surgical healing or further therapy, including sarcoidosis.  6. I will ask that she return to this clinic in 2 months for follow-up and repeat clinical examination.  At that time, we shall repeat cross-sectional imaging.  7. All of the patients and her family members questions were answered to best of my ability and to their satisfaction. They seem to understand the disease process and the above mentioned treatment algorithm. After deliberation, she agrees to proceed as outlined.  8. The patient has my business card containing all of my professional contact information and I have instructed her to call with new or worsening symptoms, questions, or concerns.  9. The total time for this visit was 15 minutes and of that, greater than 50% was dedicated to the discussion of her disease and follow-up plan.    Illene Silver, MD 07/16/2013 18:24  Assistant Professor of Surgery  Division of [facility]

## 2013-09-14 ENCOUNTER — Ambulatory Visit
Admission: RE | Admit: 2013-09-14 | Discharge: 2013-09-14 | Disposition: A | Discharge status: Home / Self Care | Payer: MEDICAID | Source: Ambulatory Visit | Attending: SURGICAL ONCOLOGY | Admitting: SURGICAL ONCOLOGY

## 2013-09-14 ENCOUNTER — Ambulatory Visit (HOSPITAL_BASED_OUTPATIENT_CLINIC_OR_DEPARTMENT_OTHER): Payer: MEDICAID | Admitting: SURGICAL ONCOLOGY

## 2013-09-14 ENCOUNTER — Other Ambulatory Visit (INDEPENDENT_AMBULATORY_CARE_PROVIDER_SITE_OTHER): Payer: Self-pay | Admitting: SURGICAL ONCOLOGY

## 2013-09-14 VITALS — BP 130/80 | HR 80 | Temp 97.9°F | Ht 61.5 in | Wt 135.4 lb

## 2013-09-14 DIAGNOSIS — C169 Malignant neoplasm of stomach, unspecified: Secondary | ICD-10-CM

## 2013-09-14 DIAGNOSIS — M25569 Pain in unspecified knee: Secondary | ICD-10-CM | POA: Insufficient documentation

## 2013-09-14 DIAGNOSIS — R911 Solitary pulmonary nodule: Secondary | ICD-10-CM | POA: Insufficient documentation

## 2013-09-14 DIAGNOSIS — R1013 Epigastric pain: Secondary | ICD-10-CM | POA: Insufficient documentation

## 2013-09-14 DIAGNOSIS — Z72820 Sleep deprivation: Secondary | ICD-10-CM | POA: Insufficient documentation

## 2013-09-14 DIAGNOSIS — D869 Sarcoidosis, unspecified: Secondary | ICD-10-CM

## 2013-09-14 DIAGNOSIS — K5090 Crohn's disease, unspecified, without complications: Secondary | ICD-10-CM

## 2013-09-14 DIAGNOSIS — M79609 Pain in unspecified limb: Secondary | ICD-10-CM | POA: Insufficient documentation

## 2013-09-14 LAB — PERFORM POC ISTAT CREATININE POINT OF CARE: CREATININE, POC: 0.6 mg/dL (ref 0.49–1.10)

## 2013-09-14 MED ORDER — IOPAMIDOL 300 MG IODINE/ML (61 %) INTRAVENOUS SOLUTION
100.00 mL | INTRAVENOUS | Status: AC
Start: 2013-09-14 — End: 2013-09-14
  Administered 2013-09-14: 100 mL via INTRAVENOUS

## 2013-09-14 MED ORDER — DIATRIZOATE MEGLUMINE & SODIUM 66 %-10 % (GASTROGRAFIN) ORAL SOLN
8.00 mL | ORAL | Status: AC
Start: 2013-09-14 — End: 2013-09-14
  Administered 2013-09-14: 8 mL via ORAL

## 2013-09-23 NOTE — Progress Notes (Signed)
SURGICAL ONCOLOGY FOLLOW-UP EVALUATION:    PATIENT:  Michele Mason  MRN:  [id number]  DOB:   09-24-1975  DATE:  09/14/2013    CANCER DIAGNOSIS AND STAGE:   1. Stage IIA, pT3 pN0 cM0, intestinal type gastric cancer  2. Sarcoidosis  3. Remote Hx of Crohn's disease    SURGICAL THERAPY:   1. 07/19/2012 - exploratory laparotomy, lysis of adhesions, subtotal gastrectomy, D2 lymphadenectomy, umbilical hernia repair  2. 07/24/2012 - R IJ port    SYSTEMIC THERAPY: 2 weeks of 5-FU (discontinued due to toxicity of the skin) by Dr. Frutoso Chase [facility])     RADIATION THERAPY: 11/09/2012 - 12/28/2012, unknown total dose (presumably 50.4 Gy) by Dr. Adah Salvage [facility])     CLINICAL TRIAL: None.     CHIEF COMPLAINT: Routine surveillance.    HISTORY OF PRESENT ILLNESS:  Michele Mason is a 38 y.o. 47 American female who returns to clinic today for routine surveillance following subtotal gastrectomy on 07/19/2012 for T3 gastric cancer.  The patient was last seen in this clinic on 07/06/2013 and today she presents with a litany of complaints, very few of which are related to her prior surgery.she appears very emotional today, even more than her usual, with significant anxiety and irritability. Her major complaints aren't regards to difficulty sleeping, pain in her fingers, knees, and ankles which she believes is secondary to sarcoid, occasional diarrhea, and occasional epigastric abdominal pain that is worse following meals. She otherwise is able to eat relatively well and is having  mostly normal bowel function.     The patient recently underwent a EGD which demonstrated mild distal esophagitis, a small hiatal hernia, subtotal gastrectomy with a patent anastomosis, mild gastritis, and a moderate amount of residual food in the stomach. There is no evidence for ulcerations of the anastomosis and no evidence for recurrent malignancy. During today's visit, she denies drainage,  redness/inflammation or tenderness of any past surgical incision or drain sites.  In addition, the patient denies palpation of any new masses or adenopathy and does not have unexplained weight loss.  The patient's main questions and concerns at this time are related to her emotional state and severe, uncontrolled anxiety and depression. As always, she is accompanied in the office today by her father who helps provide some of the above-mentioned history.    REVIEW OF SYSTEMS:  Constitutional:  urachal, emotional, alternating between laughing and crying, pleasant, cooperative, denies fevers, chills or weight loss  HEENT: denies headache, dizziness, double vision, tinnitus, nasal congestion, or difficulty swallowing  Cardiovascular: denies chest pain, palpitations, dyspnea on exertion  Respiratory: denies SOB, cough  Gastrointestinal (GI):  endorses mild epigastric abdominal pain, diarrhea; denies nausea, vomiting, constipation  Genitourinary (GU): denies dysuria, hematuria  Gynecologic (GYN): denies vaginal discharge, abnormal vaginal bleeding  Musculoskeletal:  endorses joint pain, right knee pain, bilateral hand pain; denies weakness  Endocrine: denies polydipsia, polyuria  Neurological: denies dizziness, facial droop, focal motor/sensory deficits  Psychiatric:  endorses severe anxiety, some discretion, and significant sleeping disturbances    PAST MEDICAL, PAST SURGICAL, FAMILY, AND SOCIAL HISTORY:  The patient denies any changes to this portion of her history since her visit on 02/16/2013.    HOME MEDICATIONS:  albuterol sulfate;  albuterol sulfate;  ALPRAZolam;  amLODIPine;  atenolol;  BIOTIN ORAL;  budesonide-formoterol;  diphenoxylate-atropine;  furosemide;  Ipratropium-Albuterol;  levofloxacin;  multivitamin;  ondansetron;  oxyCODONE;  oxyCODONE;  predniSONE;  promethazine-codeine;  Ranitidine HCl    ALLERGIES:  Allergies   Allergen Reactions    Adhesive Rash and Itching     PHYSICAL EXAMINATION:  ECOG  Performance Status: 1 - Symptomatic but completely ambulatory   Karnofsky Performance Status: 90% - capable of normal activity, few symptoms or signs of disease  General: pleasant, well-developed, well-nourished 58 American female who is in no acute distress.    Vital Signs: BP 130/80   Pulse 80   Temp(Src) 36.6 C (97.9 F) (Thermal Scan)   Ht 1.562 m (5' 1.5")   Wt 61.4 kg (135 lb 5.8 oz)   BMI 25.17 kg/m2   SpO2 98%  Neck: supple, trachea is midline, thyroid is symmetric, not enlarged, and without nodularity  Lymphatics: no palpable lymphadenopathy of the cervical, supraclavicular, axillary, or inguinal nodal basins bilaterally  Cardiovascular: regular rate and rhythm, S1, S2 normal, no murmur, click, rub or gallop  Pulmonary: normal effort, chest expands symmetrically, lungs are clear to auscultation bilaterally.   Abdomen: non-distended, normal bowel sounds, no bruits, soft, nontender, no organomegaly or masses  Extremities: without deformity, cyanosis, or edema  Neurologic: alert and oriented to person, place, time, and situation, cranial nerves grossly intact, no focal motor or sensory deficits  Psychiatric: speech pattern and movements are normal, normal mood, affect and judgment    LABORATORY REVIEW (I have personally reviewed all recent laboratory studies):  None.    IMAGING REVIEW (I have personally reviewed both the reports and images):  1. 09/14/2013 CT chest, abdomen, pelvis from [facility]: A 1.1 cm nodule is identified at the right lower lobe (series 4, image 66). Additional nodule measuring 5 mm is seen in the right lower lobe. These were not visualized on the previous PET CT from February 16, 2013. The lungs are clear with no consolidation or evidence of generalized interstitial disease. Fatty liver. The stomach has postsurgical changes from gastrectomy, it is incompletely distended and very mild thickening of the walls identified. There is no perigastric stranding, asymmetry, or suspicious  adenopathy. The remainder of the abdomen and pelvis is without suspicious finding.  2. 06/08/2013 CT abdomen and pelvis with contrast from [facility]: There is a small 4-5 mm subpleural nodular density in the inferior right middle lobe. Minimal areas of scarring in her atelectasis are seen. No pleural effusions. Surgical changes in the regions of the stomach with a gastrojejunostomy. No evidence of bowel obstruction. No free fluid. Fatty changes of the liver. Pancreatic body and tail are somewhat small in size. No enlarged lymphadenopathy. No aneurysmal dilatation of the aorta. There is a tiny fat-containing ventral hernia.  3. 02/16/2013 PET/CT from [facility]: Reduced size and slightly reduced metabolic activity of all previously seen hypermetabolic lymph nodes in the chest. Focal nodular opacity in the right upper lobe, seen on prior study, is significantly smaller and no longer metabolically active. No new PET/CT evidence suggestive of active malignancy or acute abnormalities.  4. 10/20/2012 CXR from [facility]: No acute cardiopulmonary process. No visible pneumothorax. Postoperative changes.  5. 09/04/2012 PET/CT from [facility]: Right upper lobe pulmonary nodule question metastatic disease. Mediastinal hilar adenopathy which may represent malignancy. Upper abdominal area with multiple surgical clips consistent with prior surgical procedure. Right neck lymph node likely reactive but malignancy cannot be excluded.    OTHER STUDIES REVIEW:  1. 09/13/2013 EGD from [facility] gastroenterology, Dr. Posey Pronto: Mild distal esophagitis, grade 1, with slightly irregular Z line, small hiatal hernia, status post subtotal gastrectomy with patent anastomosis, moderate amount of residual food in stomach, mild gastritis, multiple biopsies were  taken of the stomach. Plan to schedule upper GI with small bowel series and repeat EGD in one month.    PATHOLOGY REVIEW:  1. 09/13/2013 biopsies from EGD pending.     ASSESSMENT:  38 y.o.  62 American female with stage IIA, pT3 pN0 cM0, intestinal type gastric cancer. Recent EGD is without evidence of recurrence within the stomach, CT scan from today demonstrated new pulmonary nodules which could be either from metastatic disease or more likely related to sarcoid.     PLAN:  1. The patient's examination and imaging were discussed with her in detail today.  Her lesions in the chest are concerning for the potential of metastatic disease versus sarcoid recurrence. Given her litany of complaints with bone, and musculoskeletal pains, along with no suspicious findings within the abdomen, I believe this is likely to represent sarcoid. I will discuss with her medical oncologist the possibility of pulmonary biopsy versus short interval imaging.  2. She will be due for repeat EGD in one month given food within the stomach limiting evaluation. However, no suspicious lesions were seen on the limited examination which makes a large recurrence unlikely. Her epigastric abdominal pain is likely secondary to gastritis and a PPI will be beneficial.  3. In regards for diarrhea, we talked extensively about dumping syndrome and I have briefly discussed the dietary alterations that will be necessary post-operatively.  While the extent of change shall be determined by the size of gastric remnant, most patients should ingest small but frequent meals, high in protein and inclusive of fat, approximately six time per day.  Foods should be eaten slowly while upright and chewed well.  Simple carbohydrates (such as sugars or sweets) can contribute to dumping syndrome and should be avoided.  Finally, supplementation with B12, fat soluble vitamins, calcium and iron may be necessary. After discussions with our [place] dieticians, I personally recommend the Equate Once Daily Woman's Multivitamin as it contains nearly the entire daily recommended amounts of all of the above vitamins and minerals.  4. I am greatly concerned  regarding her psychiatric status. Deidra has always been emotional and slightly difficult to relate to, however she appears worse and I have seen her every previously. I have recommended that she undergo psychiatric evaluation for discussions of medical management for her severe, debilitating anxiety and depression. She also is under a significant amount of stress and she is tried to return to work and has had significant family commitments.   5. We discussed the chance of recurrence of the tumor in both the same and new locations, which can occur anywhere in their body at any time.  Thus, all patients should be followed up systematically for the remainder of their lifetime.  Per NCCN guidelines, follow-up should consist of repeat clinical examination every 3-6 months for 2 years, every 6-12 months until 5 years, and annually thereafter.  Laboratory testing, radiographic imaging, and endoscopy are not routinely recommended, but reserved for clear clinical indications.  6. I have discussed her comorbid conditions and how they may impact surgical healing or further therapy, including sarcoidosis.  7. I will ask that she return to this clinic in 3 months for follow-up and repeat clinical examination.  At that time, we shall repeat cross-sectional imaging.  8. All of the patients and her family members questions were answered to best of my ability and to their satisfaction. They seem to understand the disease process and the above mentioned treatment algorithm. After deliberation, she agrees to proceed as  outlined.  9. The patient has my business card containing all of my professional contact information and I have instructed her to call with new or worsening symptoms, questions, or concerns.  10. The total time for this visit was 25 minutes and of that, greater than 50% was dedicated to the discussion of her disease and follow-up plan.As always, the patient has a litany of questions, concerns, and requires a  significant amount of time due to severe anxiety, depression, and difficulty relating true symptomatology.    Illene Silver, MD 09/23/2013 12:13  Assistant Professor of Surgery  Division of [place]

## 2013-12-07 ENCOUNTER — Encounter (INDEPENDENT_AMBULATORY_CARE_PROVIDER_SITE_OTHER): Payer: Self-pay | Admitting: SURGICAL ONCOLOGY

## 2013-12-21 ENCOUNTER — Encounter (INDEPENDENT_AMBULATORY_CARE_PROVIDER_SITE_OTHER): Payer: Self-pay | Admitting: SURGICAL ONCOLOGY

## 2014-01-07 ENCOUNTER — Telehealth (INDEPENDENT_AMBULATORY_CARE_PROVIDER_SITE_OTHER): Payer: Self-pay | Admitting: SURGICAL ONCOLOGY

## 2014-01-07 ENCOUNTER — Encounter (HOSPITAL_BASED_OUTPATIENT_CLINIC_OR_DEPARTMENT_OTHER): Payer: Self-pay

## 2014-01-07 NOTE — Progress Notes (Signed)
Phone call received this date from patient. MSW Daryel Gerald is out of the office this date. Patient crying; states she is in pain, that she has an appointment with Dr. Huel Cote on 01-11-14. She has MD appointments locally on the 42 th; wants to leave after the last MD appointment for [facility]. She states she is 4 hours away; will arrive around 9:00 pm. Requests a Family House Referral. Difficult to understand patient due to her tearfulness. Asked if she should go to local ED for her pain; she states she may end up coming to our ED.  Family House referral made this date. Will notify MSW Daryel Gerald about trip; patient asks if she will call her in case the [facility] if full.

## 2014-01-07 NOTE — Telephone Encounter (Signed)
Patient called our office to find out if Dr. Huel Cote will schedule a PET scan prior to her 01/11/14 visit. I told the patient that there were no orders for a scan but I would ask Dr. Huel Cote if he wanted her to have a scan prior to her visit. The patient called back to let me know that she would have a CT scan completed closer to home and would bring the disc with the images to her appointment. Message was sent to her provider indicating that she would bring a disc to her appointment. Pt called a third time and requested a letter from Dr. Huel Cote documenting the need for her to stay in [facility] for 2 nights. I advised her that I could place a referral to the [facility] for the night before her appointment. She did not want me to refer her to the [facility] but insisted that I fax a letter on official letter head indicating that Dr. Huel Cote recommends a 2 night stay due to her illness. I advised the patient that I had to check with Dr. Huel Cote and call her back. Pt was frustrated that I could not fax said document immediately, stated that she may not keep her appointment and ended the call.

## 2014-01-07 NOTE — Progress Notes (Signed)
Phone consult from Erenest Rasher, [place]; regarding phone call she received from patient requesting a letter of medical necessity, urgently. Conveyed that I had spoken with patient; and explained that it does not have to be submitted this date. Horris Latino will check with Dr. Huel Cote; a sample letter was sent to her so he could review this and decide if he was in agreement. Per Horris Latino; patient requesting a second night in [place]; not able to make a case for that; however; Dr. Huel Cote may be aware of a medical reason why the second night is indicated.  Consult with Dr. Marguerita Beards, via e-mail; to inquire if an appointment with [facility] is possible for the am of 01-11-14.

## 2014-01-11 ENCOUNTER — Ambulatory Visit: Payer: MEDICAID | Attending: SURGICAL ONCOLOGY | Admitting: SURGICAL ONCOLOGY

## 2014-01-11 ENCOUNTER — Encounter (INDEPENDENT_AMBULATORY_CARE_PROVIDER_SITE_OTHER): Payer: Self-pay | Admitting: SURGICAL ONCOLOGY

## 2014-01-11 ENCOUNTER — Ambulatory Visit (HOSPITAL_COMMUNITY): Payer: MEDICAID | Admitting: Psychiatry

## 2014-01-11 VITALS — BP 100/70 | Temp 96.8°F | Ht 61.0 in | Wt 118.4 lb

## 2014-01-11 DIAGNOSIS — D869 Sarcoidosis, unspecified: Secondary | ICD-10-CM | POA: Insufficient documentation

## 2014-01-11 DIAGNOSIS — R634 Abnormal weight loss: Secondary | ICD-10-CM

## 2014-01-11 DIAGNOSIS — K50911 Crohn's disease, unspecified, with rectal bleeding: Secondary | ICD-10-CM

## 2014-01-11 DIAGNOSIS — R21 Rash and other nonspecific skin eruption: Secondary | ICD-10-CM | POA: Insufficient documentation

## 2014-01-11 DIAGNOSIS — C169 Malignant neoplasm of stomach, unspecified: Secondary | ICD-10-CM | POA: Insufficient documentation

## 2014-01-11 DIAGNOSIS — F411 Generalized anxiety disorder: Secondary | ICD-10-CM | POA: Insufficient documentation

## 2014-01-11 DIAGNOSIS — R197 Diarrhea, unspecified: Secondary | ICD-10-CM

## 2014-01-11 DIAGNOSIS — K5090 Crohn's disease, unspecified, without complications: Secondary | ICD-10-CM | POA: Insufficient documentation

## 2014-01-11 HISTORY — DX: Abnormal weight loss: R63.4

## 2014-01-11 HISTORY — DX: Diarrhea, unspecified: R19.7

## 2014-01-11 NOTE — Progress Notes (Signed)
SURGICAL ONCOLOGY FOLLOW-UP EVALUATION:    PATIENT:  Michele Mason  MRN:  [id number]  DOB:   08-14-1975  DATE:  01/11/2014    CANCER DIAGNOSIS AND STAGE:   1. Stage IIA, pT3 pN0 cM0, intestinal type gastric cancer  2. Sarcoidosis  3. Remote Hx of Crohn's disease    SURGICAL THERAPY:   1. 07/19/2012 - exploratory laparotomy, lysis of adhesions, subtotal gastrectomy, D2 lymphadenectomy, umbilical hernia repair  2. 07/24/2012 - R IJ port    SYSTEMIC THERAPY: 2 weeks of 5-FU (discontinued due to toxicity of the skin) by Dr. Frutoso Chase [facility], [facility])     RADIATION THERAPY: 11/09/2012 - 12/28/2012, unknown total dose (presumably 50.4 Gy) by Dr. Adah Salvage [facility], [facility])     CLINICAL TRIAL: None.     CHIEF COMPLAINT: Routine surveillance.    HISTORY OF PRESENT ILLNESS:  Michele Mason is a 38 y.o. 72 American female who returns to clinic today for routine surveillance following subtotal gastrectomy on 07/19/2012 for T3 gastric cancer.  The patient was last seen in this clinic on 09/14/2013 and today she presents once again with a litany of complaints, very few of which are related to her prior surgery.  As always, she appears very emotional today, with significant anxiety and irritability. She was supposed to see psychiatry this morning, but was a no show due to "abdominal pain" and "diarrhea."  I have counseled her extensively, on multiple office visits that she needs to seek counseling due to severe emotional lability and overwhelming anxiety.  However, she continues to find ways to either miss appointments or fail to seek care.    Today, her major complaints are in regards to severe diarrhea with some blood and lots of mucous.  She occasionally reports epigastric abdominal pain that is worse following meals. Per her report, anything she eats goes "right through her" and she has been losing weight. During today's visit, she denies drainage, redness/inflammation or tenderness of any past  surgical incision or drain sites.  In addition, the patient denies palpation of any new masses or adenopathy.  The patient's main questions and concerns at this time are related to her emotional state and diarrhea. She is alone in the office today.    REVIEW OF SYSTEMS:  Constitutional: emotional, alternating between laughing and crying, pleasant, cooperative, denies fevers, chills or weight loss  HEENT: denies headache, dizziness, double vision, tinnitus, nasal congestion, or difficulty swallowing  Cardiovascular: denies chest pain, palpitations, dyspnea on exertion  Respiratory: denies SOB, cough  Gastrointestinal (GI):  endorses mild epigastric abdominal pain, diarrhea; denies nausea, vomiting, constipation  Genitourinary (GU): denies dysuria, hematuria  Gynecologic (GYN): denies vaginal discharge, abnormal vaginal bleeding  Musculoskeletal:  endorses joint pain, right knee pain, bilateral hand pain; denies weakness  Endocrine: denies polydipsia, polyuria  Neurological: denies dizziness, facial droop, focal motor/sensory deficits  Psychiatric:  endorses severe anxiety, some discretion, and significant sleeping disturbances    PAST MEDICAL, PAST SURGICAL, FAMILY, AND SOCIAL HISTORY:  The patient denies any changes to this portion of her history since her visit on 09/14/2012.    HOME MEDICATIONS:  albuterol sulfate;  albuterol sulfate;  amLODIPine;  budesonide-formoterol;  Ipratropium-Albuterol;  lisinopril;  multivitamin;  ondansetron;  predniSONE;  promethazine-codeine;  Ranitidine HCl    ALLERGIES:  Allergies   Allergen Reactions    Adhesive Rash and Itching     PHYSICAL EXAMINATION:  ECOG Performance Status: 1 - Symptomatic but completely ambulatory   Karnofsky Performance Status: 90% -  capable of normal activity, few symptoms or signs of disease  General: pleasant, well-developed, well-nourished 38 American female who is in no acute distress.    Vital Signs: BP 100/70 mmHg   Temp(Src) 36 C (96.8 F)    Ht 1.549 m (5\' 1" )   Wt 53.7 kg (118 lb 6.2 oz)   BMI 22.38 kg/m2  Neck: supple, trachea is midline, thyroid is symmetric, not enlarged, and without nodularity  Lymphatics: no palpable lymphadenopathy of the cervical, supraclavicular, axillary, or inguinal nodal basins bilaterally  Cardiovascular: regular rate and rhythm, S1, S2 normal, no murmur, click, rub or gallop  Pulmonary: normal effort, chest expands symmetrically, lungs are clear to auscultation bilaterally.   Abdomen: non-distended, normal bowel sounds, no bruits, soft, nontender, no organomegaly or masses  Extremities: without deformity, cyanosis, or edema  Neurologic: alert and oriented to person, place, time, and situation, cranial nerves grossly intact, no focal motor or sensory deficits  Psychiatric: speech pattern and movements are normal, normal mood, affect and judgment    LABORATORY REVIEW (I have personally reviewed all recent laboratory studies):  None.    IMAGING REVIEW (I have personally reviewed both the reports and images):  1. 01/09/2014 Nuclear gastric emptying study from [facility]: normal gastric emptying.  Estimated gastric emptying at 60 and 120 minutes was 62% and 81%, respectively.  (Normal emptying is more than 46%).  2. 09/14/2013 CT chest, abdomen, pelvis from [facility]: A 1.1 cm nodule is identified at the right lower lobe (series 4, image 66). Additional nodule measuring 5 mm is seen in the right lower lobe. These were not visualized on the previous PET CT from February 16, 2013. The lungs are clear with no consolidation or evidence of generalized interstitial disease. Fatty liver. The stomach has postsurgical changes from gastrectomy, it is incompletely distended and very mild thickening of the walls identified. There is no perigastric stranding, asymmetry, or suspicious adenopathy. The remainder of the abdomen and pelvis is without suspicious finding.  3. 06/08/2013 CT abdomen and pelvis with contrast from  [facility]: There is a small 4-5 mm subpleural nodular density in the inferior right middle lobe. Minimal areas of scarring in her atelectasis are seen. No pleural effusions. Surgical changes in the regions of the stomach with a gastrojejunostomy. No evidence of bowel obstruction. No free fluid. Fatty changes of the liver. Pancreatic body and tail are somewhat small in size. No enlarged lymphadenopathy. No aneurysmal dilatation of the aorta. There is a tiny fat-containing ventral hernia.  4. 02/16/2013 PET/CT from [facility]: Reduced size and slightly reduced metabolic activity of all previously seen hypermetabolic lymph nodes in the chest. Focal nodular opacity in the right upper lobe, seen on prior study, is significantly smaller and no longer metabolically active. No new PET/CT evidence suggestive of active malignancy or acute abnormalities.  5. 10/20/2012 CXR from [facility]: No acute cardiopulmonary process. No visible pneumothorax. Postoperative changes.  6. 09/04/2012 PET/CT from [facility]: Right upper lobe pulmonary nodule question metastatic disease. Mediastinal hilar adenopathy which may represent malignancy. Upper abdominal area with multiple surgical clips consistent with prior surgical procedure. Right neck lymph node likely reactive but malignancy cannot be excluded.    OTHER STUDIES REVIEW:  1. 09/13/2013 EGD from [facility] gastroenterology, Dr. Posey Pronto: Mild distal esophagitis, grade 1, with slightly irregular Z line, small hiatal hernia, status post subtotal gastrectomy with patent anastomosis, moderate amount of residual food in stomach, mild gastritis, multiple biopsies were taken of the stomach. Plan to schedule upper GI  with small bowel series and repeat EGD in one month.    PATHOLOGY REVIEW:  1. 09/13/2013 biopsies from EGD pending.     ASSESSMENT:  38 y.o. 86 American female with stage IIA, pT3 pN0 cM0, intestinal type gastric cancer. I worry that she has a recurrent episode of Crohn's  disease given watery diarrhea with mucous and blood.    PLAN:  1. As usual, Tanji is an incredibly difficult patient to care for.  She continues to have severe emotional lability which limits my ability to evaluate her symptoms and to discuss rash treatment algorithm this with her.  She constantly has excuse is asked to reasons why she misses appointments, does not seek psychiatric counseling, or why her situation prevents her from following up in appropriate manners.  She has been seeing her primary care physician, oncologist, radiation oncologist, and a GI physician who has been helping manage her symptoms.  2. In regards to her psychiatric conditions, I once again reiterated that she must seek care through a psychiatrist.  She has severe almost debilitating anxiety and a believe she would benefit from some form of medication to prevent emotional lability and anxiety attacks.  She states she will reschedule her psychiatric evaluation or seek counseling closer to home.  3. In regards to her diarrhea, given blood and mucus in the stool, I am significantly worried about recurrence of her Crohn's disease which would explain the vast majority of her symptomatology.  The patient states that she has a GI physician by the name of Dr. Posey Pronto and she will followup with him for colonoscopy and possible repeat EGD.  4. Her last cross-sectional imaging was in April, and she will be due for repeat imaging in 3 months from today.  5. We discussed the chance of recurrence of the tumor in both the same and new locations, which can occur anywhere in their body at any time.  Thus, all patients should be followed up systematically for the remainder of their lifetime.  Per NCCN guidelines, follow-up should consist of repeat clinical examination every 3-6 months for 2 years, every 6-12 months until 5 years, and annually thereafter.  Laboratory testing, radiographic imaging, and endoscopy are not routinely recommended, but reserved for  clear clinical indications.  6. I have discussed her comorbid conditions and how they may impact surgical healing or further therapy, including sarcoidosis and a possible Crohn's disease recurrence.  7. I will ask that she return to this clinic in 3 months for follow-up and repeat clinical examination.  At that time, we shall repeat cross-sectional imaging.  8. All of the patients and her family members questions were answered to best of my ability and to their satisfaction. They seem to understand the disease process and the above mentioned treatment algorithm. After deliberation, she agrees to proceed as outlined.  9. The patient has my business card containing all of my professional contact information and I have instructed her to call with new or worsening symptoms, questions, or concerns.  10. The total time for this visit was 25 minutes and of that, greater than 50% was dedicated to the discussion of her disease and follow-up plan.As always, the patient has a litany of questions, concerns, and requires a significant amount of time due to severe anxiety, depression, and difficulty relating true symptomatology.    Illene Silver, MD 01/11/2014 18:30  Assistant Professor of Surgery  Division of [place]

## 2014-04-19 ENCOUNTER — Other Ambulatory Visit (INDEPENDENT_AMBULATORY_CARE_PROVIDER_SITE_OTHER): Payer: Self-pay

## 2014-04-19 ENCOUNTER — Encounter (HOSPITAL_BASED_OUTPATIENT_CLINIC_OR_DEPARTMENT_OTHER): Payer: Self-pay

## 2014-04-19 ENCOUNTER — Ambulatory Visit (INDEPENDENT_AMBULATORY_CARE_PROVIDER_SITE_OTHER)
Admission: RE | Admit: 2014-04-19 | Discharge: 2014-04-19 | Disposition: A | Discharge status: Home / Self Care | Payer: Medicaid Other | Source: Ambulatory Visit | Attending: SURGICAL ONCOLOGY | Admitting: SURGICAL ONCOLOGY

## 2014-04-19 ENCOUNTER — Encounter (INDEPENDENT_AMBULATORY_CARE_PROVIDER_SITE_OTHER): Payer: Self-pay | Admitting: SURGICAL ONCOLOGY

## 2014-04-19 ENCOUNTER — Ambulatory Visit (HOSPITAL_BASED_OUTPATIENT_CLINIC_OR_DEPARTMENT_OTHER): Payer: Medicaid Other | Admitting: SURGICAL ONCOLOGY

## 2014-04-19 ENCOUNTER — Ambulatory Visit (HOSPITAL_BASED_OUTPATIENT_CLINIC_OR_DEPARTMENT_OTHER)
Admission: RE | Admit: 2014-04-19 | Discharge: 2014-04-19 | Disposition: A | Discharge status: Home / Self Care | Payer: Medicaid Other | Source: Ambulatory Visit | Attending: Hematology & Oncology | Admitting: Hematology & Oncology

## 2014-04-19 ENCOUNTER — Ambulatory Visit
Admission: RE | Admit: 2014-04-19 | Discharge: 2014-04-19 | Disposition: A | Discharge status: Home / Self Care | Payer: Medicaid Other | Source: Ambulatory Visit | Attending: SURGICAL ONCOLOGY | Admitting: SURGICAL ONCOLOGY

## 2014-04-19 VITALS — BP 142/94 | HR 112 | Temp 97.9°F | Ht 61.5 in | Wt 108.2 lb

## 2014-04-19 DIAGNOSIS — C169 Malignant neoplasm of stomach, unspecified: Secondary | ICD-10-CM | POA: Insufficient documentation

## 2014-04-19 DIAGNOSIS — Z6820 Body mass index (BMI) 20.0-20.9, adult: Secondary | ICD-10-CM

## 2014-04-19 DIAGNOSIS — D869 Sarcoidosis, unspecified: Secondary | ICD-10-CM | POA: Insufficient documentation

## 2014-04-19 DIAGNOSIS — F329 Major depressive disorder, single episode, unspecified: Secondary | ICD-10-CM | POA: Insufficient documentation

## 2014-04-19 DIAGNOSIS — Z923 Personal history of irradiation: Secondary | ICD-10-CM | POA: Insufficient documentation

## 2014-04-19 DIAGNOSIS — K5090 Crohn's disease, unspecified, without complications: Secondary | ICD-10-CM | POA: Insufficient documentation

## 2014-04-19 DIAGNOSIS — F419 Anxiety disorder, unspecified: Secondary | ICD-10-CM | POA: Insufficient documentation

## 2014-04-19 LAB — CBC
HCT: 34.8 % (ref 33.5–45.2)
HGB: 11.3 g/dL (ref 11.2–15.2)
HGB: 11.3 g/dL (ref 11.2–15.2)
MCH: 31.8 pg (ref 27.4–33.0)
MCHC: 32.6 g/dL (ref 32.5–35.8)
MCHC: 32.6 g/dL (ref 32.5–35.8)
MCV: 97.5 fL (ref 78–100)
MPV: 8.5 fL (ref 7.5–11.5)
MPV: 8.5 fL (ref 7.5–11.5)
PLATELET COUNT: 231 10*3/uL (ref 140–450)
RBC: 3.57 MIL/uL — ABNORMAL LOW (ref 3.63–4.92)
RDW: 15.7 % — ABNORMAL HIGH (ref 12.0–15.0)
WBC: 9.2 THOU/uL (ref 3.5–11.0)

## 2014-04-19 LAB — COMPREHENSIVE METABOLIC PANEL, NON-FASTING
ALBUMIN: 2.4 g/dL — ABNORMAL LOW (ref 3.5–5.0)
ALKALINE PHOSPHATASE: 117 U/L (ref <150)
ALT (SGPT): 15 U/L (ref <55)
ALT (SGPT): 15 U/L (ref <55)
ANION GAP: 11 mmol/L (ref 4–13)
AST (SGOT): 22 U/L (ref 8–41)
BILIRUBIN, TOTAL: 0.3 mg/dL (ref 0.3–1.3)
BUN/CREAT RATIO: 13 (ref 6–22)
BUN: 11 mg/dL (ref 8–25)
CALCIUM: 8.4 mg/dL — ABNORMAL LOW (ref 8.5–10.4)
CARBON DIOXIDE: 20 mmol/L — ABNORMAL LOW (ref 22–32)
CHLORIDE: 108 mmol/L (ref 96–111)
CREATININE: 0.83 mg/dL (ref 0.49–1.10)
ESTIMATED GLOMERULAR FILTRATION RATE: >59 mL/min/{1.73_m2} (ref >59)
GLUCOSE,NONFAST: 104 mg/dL (ref 65–139)
POTASSIUM: 4 mmol/L (ref 3.5–5.1)
SODIUM: 139 mmol/L (ref 136–145)
TOTAL PROTEIN: 5.2 g/dL — ABNORMAL LOW (ref 6.4–8.3)

## 2014-04-19 LAB — PERFORM POC WHOLE BLOOD GLUCOSE: GLUCOSE, POINT OF CARE: 84 mg/dL (ref 70–105)

## 2014-04-19 LAB — C-REACTIVE PROTEIN(CRP),INFLAMMATION: C-REACTIVE PROTEIN (CRP),INFLAMMATION: 16.4 mg/L — ABNORMAL HIGH (ref <8.0)

## 2014-04-19 LAB — PREALBUMIN: PREALBUMIN: 10.1 mg/dL — ABNORMAL LOW (ref 18–45)

## 2014-04-19 LAB — PERFORM POC ISTAT CREATININE POINT OF CARE
CREATININE, POC: 0.9 mg/dL (ref 0.49–1.10)
CREATININE, POC: 0.9 mg/dL (ref 0.49–1.10)

## 2014-04-19 LAB — CARCINOEMBRYONIC ANTIGEN: CARCINOEMBRYONIC AG: 3.6 ng/mL (ref <5.0)

## 2014-04-19 MED ORDER — DIATRIZOATE MEGLUMINE & SODIUM 66 %-10 % (GASTROGRAFIN) ORAL SOLN
10.00 mL | ORAL | Status: AC
Start: 2014-04-19 — End: 2014-04-19
  Administered 2014-04-19: 12:00:00 10 mL via ORAL

## 2014-04-19 MED ORDER — LIPASE-PROTEASE-AMYLASE 12,000-38,000-60,000 UNIT CAPSULE,DELAYED REL
2.00 | DELAYED_RELEASE_CAPSULE | Freq: Three times a day (TID) | ORAL | Status: DC
Start: 2014-04-19 — End: 2015-10-08

## 2014-04-19 MED ORDER — IOPAMIDOL 300 MG IODINE/ML (61 %) INTRAVENOUS SOLUTION
100.00 mL | INTRAVENOUS | Status: AC
Start: 2014-04-19 — End: 2014-04-19
  Administered 2014-04-19: 12:00:00 100 mL via INTRAVENOUS

## 2014-04-19 NOTE — Progress Notes (Signed)
PATIENT NAME:  Michele Mason  CHART NUMBER:  [id number]  DATE OF BIRTH: 1975/11/25  DATE OF SERVICE: 04/19/2014    MEDICAL NUTRITION THERAPY FOLLOW-UP    Female with Intestinal type gastric cancer, sarcoidosis    Ht:  154.9 cm (61")  Wt:  49.1 kg (108.02#)    Wt loss of 10# in 3 months    Pt stated that "she has lost 31# in 6 wks".  She indicated that food goes right through her.  She has been incontinent through the night.  She does not attribute it to any certain food or beverage.  She was interested in trying to drink Ensure again to help her regain weight and to get the nutrition she needs.    She meets the eligibility criteria.  I was able to secure 2 cases of Ensure Complete for her today and used money from the [employer].  Name and number provided.      Greater than 15 minutes spent interviewing and counseling pt.    Helane Rima, RD, LD  Oncology Dietitian

## 2014-04-21 NOTE — Progress Notes (Signed)
SURGICAL ONCOLOGY FOLLOW-UP EVALUATION:    PATIENT:  Michele Mason  MRN:  [id number]  DOB:   [birth year]  DATE:  04/19/2014    CANCER DIAGNOSIS AND STAGE:   1. Stage IIA, pT3 pN0 cM0, intestinal type gastric cancer  2. Sarcoidosis  3. Remote Hx of Crohn's disease    SURGICAL THERAPY:   1. 07/19/2012 - exploratory laparotomy, lysis of adhesions, subtotal gastrectomy, D2 lymphadenectomy, umbilical hernia repair  2. 07/24/2012 - R IJ port    SYSTEMIC THERAPY: 2 weeks of 5-FU (discontinued due to toxicity of the skin) by Dr. Frutoso [facility])     RADIATION THERAPY: 11/09/2012 - 12/28/2012, unknown total dose (presumably 50.4 Gy) by Dr. Adah Salvage [facility])     CLINICAL TRIAL: None.     CHIEF COMPLAINT: Routine surveillance.    HISTORY OF PRESENT ILLNESS:  Michele Mason is a 38 y.o. 38 American female who returns to clinic today for routine surveillance following subtotal gastrectomy on 07/19/2012 for T3 gastric cancer.  The patient was last seen in this clinic on 01/11/2014 and today she presents once again with a complaints of diarrhea following eating.  Since she was last seen in this office, she has been admitted to [facility] several times, each with abdominal pain and diarrhea.  There was concern for colitis on CT scan colonoscopy demonstrated no evidence of recurrent Crohn's disease.  She did have an EGD demonstrating 2 small ulcerations of the gastrojejunostomy was started on Carafate.  The patient continues to report diarrhea following eating, which she notes it is loose with oily substances.  The patient has also obtained a new primary care physician, whom she states she likes quite a bit and is taking good care of her.  Her PCP has initiated Zoloft for her anxiety and depression which she believes is helping slightly.    Today, her major complaints are listed above. During today's visit, she denies drainage, redness/inflammation or tenderness of  any past surgical incision or drain sites.  In addition, the patient denies palpation of any new masses or adenopathy.  The patient's main questions and concerns at this time are related to her emotional state and diarrhea. She is alone in the office today.  Of note, this is the first time since her surgery that she has been able to drive for self to her visits without family assistance.    REVIEW OF SYSTEMS:  Constitutional: emotional, alternating between laughing and crying, pleasant, cooperative, denies fevers, chills or weight loss  HEENT: denies headache, dizziness, double vision, tinnitus, nasal congestion, or difficulty swallowing  Cardiovascular: denies chest pain, palpitations, dyspnea on exertion  Respiratory: denies SOB, cough  Gastrointestinal (GI):  endorses mild epigastric abdominal pain, diarrhea; denies nausea, vomiting, constipation  Genitourinary (GU): denies dysuria, hematuria  Gynecologic (GYN): denies vaginal discharge, abnormal vaginal bleeding  Musculoskeletal:  endorses joint pain, right knee pain, bilateral hand pain; denies weakness  Endocrine: denies polydipsia, polyuria  Neurological: denies dizziness, facial droop, focal motor/sensory deficits  Psychiatric:  endorses severe anxiety, some discretion, and significant sleeping disturbances    PAST MEDICAL, PAST SURGICAL, FAMILY, AND SOCIAL HISTORY:  The patient denies any changes to this portion of her history since her visit on 01/11/2013.    HOME MEDICATIONS:  albuterol sulfate;  albuterol sulfate;  ALPRAZolam;  amLODIPine;  budesonide-formoterol;  cholestyramine-aspartame;  ferrous sulfate;  ipratropium-albuterol;  lisinopril;  metoclopramide HCl;  multivitamin;  oxyCODONE;  pancreatic enzyme replacement;  predniSONE;  sertraline;  sucralfate    ALLERGIES:  Allergies   Allergen Reactions    Adhesive Rash and Itching     PHYSICAL EXAMINATION:  ECOG Performance Status: 1 - Symptomatic but completely ambulatory   Karnofsky Performance  Status: 90% - capable of normal activity, few symptoms or signs of disease  General: pleasant, well-developed, well-nourished 38 American female who is in no acute distress.    Vital Signs: BP 142/94 mmHg   Pulse 112   Temp(Src) 36.6 C (97.9 F) (Thermal Scan)   Ht 1.562 m (5' 1.5")   Wt 49.1 kg (108 lb 3.9 oz)   BMI 20.12 kg/m2   SpO2 98%  Neck: supple, trachea is midline, thyroid is symmetric, not enlarged, and without nodularity  Lymphatics: no palpable lymphadenopathy of the cervical, supraclavicular, axillary, or inguinal nodal basins bilaterally  Cardiovascular: regular rate and rhythm, S1, S2 normal, no murmur, click, rub or gallop  Pulmonary: normal effort, chest expands symmetrically, lungs are clear to auscultation bilaterally.   Abdomen: non-distended, normal bowel sounds, no bruits, soft, nontender, no organomegaly or masses  Extremities: without deformity, cyanosis, or edema  Neurologic: alert and oriented to person, place, time, and situation, cranial nerves grossly intact, no focal motor or sensory deficits  Psychiatric: speech pattern and movements are normal, normal mood, affect and judgment    LABORATORY REVIEW (I have personally reviewed all recent laboratory studies):  1. 04/19/2014 from [facility]: WBC 9.2, Hgb 11.3, plt 231, with normal MCV and differential. Na 139, K 4.0, Cl 108, bicarbonate 20, BUN 11, creatinine 0.83, glucose 104, calcium 8.4.  Total bilirubin 0.3, alkaline phosphatase 117, AST 22, ALT 15, albumin 2.4, CEA 3.6, CRP 16.4.    IMAGING REVIEW (I have personally reviewed both the reports and images):  1. 04/19/2014 PET/CT from [facility]: No PET/CT evidence to suggest active malignancy. Diffuse bowel activity, most intense in the descending and sigmoid portions of the colon. This is most likely related to chronic inflammation given the patient's history of inflammatory bowel disease. There is otherwise no evidence to suggest acute inflammation or tumor, however.  2. 02/17/2014 CT A/P  from [facility] Subtotal gastrectomy, fatty liver, and tiny nonobstructing renal calculi.  Diffuse edema of the colon without radiographic evidence of bowel function.  Correlation to rule out radiation enteritis, infectious, or inflammatory processes suggested.  A few mesenteric lymph nodes are present.   3. 02/17/2014 right upper quadrant ultrasound from [facility]: Unremarkable sonogram of the gallbladder.  No gallstones, wall thickening, pericholecystic fluid, or biliary ductal dilatation is seen.  There is hepatic steatosis.  4. 02/17/2014 HIDA scan from [facility]: No scintigraphic evidence of acute cholecystitis or biliary ductal obstruction.  There is biliary dyskinesia with an estimated gallbladder ejection fraction of 21%.  5. 01/30/2014 bilateral lower from a venous Doppler from [facility]: No evidence of deep venous thrombosis in bilateral lower extremities.  6. 01/09/2014 Nuclear gastric emptying study from [facility]: normal gastric emptying.  Estimated gastric emptying at 60 and 120 minutes was 62% and 81%, respectively.  (Normal emptying is more than 46%).  7. 09/14/2013 CT chest, abdomen, pelvis from [facility]: A 1.1 cm nodule is identified at the right lower lobe (series 4, image 66). Additional nodule measuring 5 mm is seen in the right lower lobe. These were not visualized on the previous PET CT from February 16, 2013. The lungs are clear with no consolidation or evidence of generalized interstitial disease. Fatty liver. The stomach has postsurgical  changes from gastrectomy, it is incompletely distended and very mild thickening of the walls identified. There is no perigastric stranding, asymmetry, or suspicious adenopathy. The remainder of the abdomen and pelvis is without suspicious finding.  8. 06/08/2013 CT abdomen and pelvis with contrast from [facility]: There is a small 4-5 mm subpleural nodular  density in the inferior right middle lobe. Minimal areas of scarring in her atelectasis are seen. No pleural effusions. Surgical changes in the regions of the stomach with a gastrojejunostomy. No evidence of bowel obstruction. No free fluid. Fatty changes of the liver. Pancreatic body and tail are somewhat small in size. No enlarged lymphadenopathy. No aneurysmal dilatation of the aorta. There is a tiny fat-containing ventral hernia.  9. 02/16/2013 PET/CT from [facility]: Reduced size and slightly reduced metabolic activity of all previously seen hypermetabolic lymph nodes in the chest. Focal nodular opacity in the right upper lobe, seen on prior study, is significantly smaller and no longer metabolically active. No new PET/CT evidence suggestive of active malignancy or acute abnormalities.  10. 10/20/2012 CXR from [facility]: No acute cardiopulmonary process. No visible pneumothorax. Postoperative changes.  11. 09/04/2012 PET/CT from [facility]: Right upper lobe pulmonary nodule question metastatic disease. Mediastinal hilar adenopathy which may represent malignancy. Upper abdominal area with multiple surgical clips consistent with prior surgical procedure. Right neck lymph node likely reactive but malignancy cannot be excluded.    OTHER STUDIES REVIEW:  1. 02/01/2014 EGD from [facility], Dr. Posey Pronto: Mild distal esophagitis, small hiatal hernia, status post partial gastrectomy with gastroduodenostomy, 2 small anastomotic ulcer, biopsies taken.  2. 02/01/2014 colonoscopy from [facility], Dr. Posey Pronto: Normal mucosa of the ileum.  There is trace colitis in the left colon which was nonspecific.  Biopsies were taken.  There was no definitive signs of Crohn's disease.  3. 09/13/2013 EGD from [facility] gastroenterology, Dr. Posey Pronto: Mild distal esophagitis, grade 1, with slightly irregular Z line, small hiatal hernia, status post subtotal gastrectomy with patent anastomosis, moderate amount of residual food in  stomach, mild gastritis, multiple biopsies were taken of the stomach. Plan to schedule upper GI with small bowel series and repeat EGD in one month.    PATHOLOGY REVIEW:  1. 01/12/2014 gastric ulcers: Benign chronic ulceration.  Right and left colon biopsies: Mild nonspecific chronic inflammation, no evidence of inflammatory bowel disease, lymphocytic or collagenous colitis.    ASSESSMENT:  38 y.o. 75 American female with stage IIA, pT3 pN0 cM0, intestinal type gastric cancer. I worry that she has a recurrent episode of Crohn's disease given watery diarrhea with mucous and blood.    PLAN:  1. She has no evidence for recurrence on imaging or endoscopic evaluations.  At this time, regarding her gastric cancer, she is NED.  2. In regards to her continued diarrhea, this is only mildly improved from prior visit.  She is underwent colonoscopy demonstrating no evidence of recurrent Crohn's disease, however she did have colitis of the left colon which could be radiation related.  However, she describes oily stools which could be a symptom of pancreatic insufficiency which is also possible following her radiation to this area.  In addition, given her gastric reconstruction, it is possible that she has not been getting enough pancreatic enzymes.  Thus, I will supplement her with preop 3 times daily with meals.  3. In regards to her anxiety and depression, it appears that she is slightly improved on Zoloft therapy which was prescribed to her new PCP, whom she likes quite  a bit.  [place] informs me that her new physician does not believe she requires psychiatric evaluation.  I remain concerned that she is not dealing with her medical conditions as well as she could, especially given that she is nearly 2 years from her surgery.  She remains very emotional labile during her time with me, although again, it is slightly improved from her last visit.  I do believe that either psychiatric or psychologic counseling would be of  benefit to her, so that she has someone to discuss all of her concerns, fears, etc.  4. We discussed the chance of recurrence of the tumor in both the same and new locations, which can occur anywhere in their body at any time.  Thus, all patients should be followed up systematically for the remainder of their lifetime.  Per NCCN guidelines, follow-up should consist of repeat clinical examination every 3-6 months for 2 years, every 6-12 months until 5 years, and annually thereafter.  Laboratory testing, radiographic imaging, and endoscopy are not routinely recommended, but reserved for clear clinical indications.  5. Once again, I have briefly discussed the dietary alterations that will be necessary post-operatively.  While the extent of change shall be determined by the size of gastric remnant, most patients should ingest small but frequent meals, high in protein and inclusive of fat, approximately six time per day.  Foods should be eaten slowly while upright and chewed well.  Simple carbohydrates (such as sugars or sweets) can contribute to dumping syndrome and should be avoided.  Finally, supplementation with B12, fat soluble vitamins, calcium and iron may be necessary. After discussions with our [place] dieticians, I personally recommend the Equate Once Daily Woman's Multivitamin as it contains nearly the entire daily recommended amounts of all of the above vitamins and minerals. Formal nutritional assessment and counseling may be extremely helpful to certain patients.  6. I have discussed her comorbid conditions and how they may impact surgical healing or further therapy, including sarcoidosis, severe anxiety, and a possible Crohn's disease recurrence.  7. I will ask that she return to this clinic in 3 months for follow-up and repeat clinical examination.  8. All of the patients and her family members questions were answered to best of my ability and to their satisfaction. They seem to understand the disease  process and the above mentioned treatment algorithm. After deliberation, she agrees to proceed as outlined.  9. The patient has my business card containing all of my professional contact information and I have instructed her to call with new or worsening symptoms, questions, or concerns.  10. The total time for this visit was 25 minutes and of that, greater than 50% was dedicated to the discussion of her disease and follow-up plan.As always, the patient has a litany of questions, concerns, and requires a significant amount of time due to severe anxiety, depression, and difficulty relating true symptomatology.    Illene Silver, MD 04/21/2014 14:54  Assistant Professor of Surgery  Division of [place]

## 2014-06-11 NOTE — Progress Notes (Signed)
MSW authorized the sale of nutritional supplements through the [employer] at [facility]; payment was made through the [employer] on 04/19/2014.    Deneise Lever, MSW

## 2014-07-26 ENCOUNTER — Encounter (INDEPENDENT_AMBULATORY_CARE_PROVIDER_SITE_OTHER): Payer: Self-pay | Admitting: SURGICAL ONCOLOGY

## 2014-07-26 ENCOUNTER — Encounter (INDEPENDENT_AMBULATORY_CARE_PROVIDER_SITE_OTHER): Payer: Medicaid Other | Admitting: SURGICAL ONCOLOGY

## 2014-07-26 NOTE — Nurses Notes (Signed)
Documentation      Summary: thomay     >> AGNES [name] 07/26/2014 12:42 PM  thomay pt - pt nos appt today stating that there "was a tragady in her family". Pt wants to know if dr will call her at [phone]. Pt r/s to 2.26.16 and wants to know if she needs lab work before her appt. Thank you                 Call History         Type Contact Phone User     07/26/2014 12:26 PM Phone (Incoming) Michele Mason, Michele Mason (Self) [phone] [name]) [name], [name]         I returned patient's call and let her know that Dr. Huel Cote has not ordered any blood work prior to her next appointment. Patient verbalized understanding.

## 2014-08-05 ENCOUNTER — Encounter (HOSPITAL_BASED_OUTPATIENT_CLINIC_OR_DEPARTMENT_OTHER): Payer: Self-pay

## 2014-08-05 NOTE — Progress Notes (Signed)
Phone call from patient this afternoon requesting that we submit an MTM lodging request for 08-08-14; that she has an 11:30 am appt on 08-09-14. Explained that I would take care of this today; but that MTM may not approve it due to late nature of her appt. Before MTM request could be completed; received a call from Taylor at the [facility] stating she had received an MTM approval for lodging for patient on 08-08-14 and 08-09-14; but she has no [facility] referral. Referral made this date. Attempted to call patient; but it went to voice mail; and her mailbox is full and cannot accept messages.

## 2014-08-08 ENCOUNTER — Other Ambulatory Visit (HOSPITAL_COMMUNITY): Payer: Self-pay | Admitting: SURGICAL ONCOLOGY

## 2014-08-08 DIAGNOSIS — C169 Malignant neoplasm of stomach, unspecified: Secondary | ICD-10-CM

## 2014-08-09 ENCOUNTER — Ambulatory Visit (HOSPITAL_BASED_OUTPATIENT_CLINIC_OR_DEPARTMENT_OTHER): Payer: MEDICAID | Admitting: SURGICAL ONCOLOGY

## 2014-08-09 ENCOUNTER — Ambulatory Visit
Admission: RE | Admit: 2014-08-09 | Discharge: 2014-08-09 | Disposition: A | Discharge status: Home / Self Care | Payer: MEDICAID | Source: Ambulatory Visit | Attending: SURGICAL ONCOLOGY | Admitting: SURGICAL ONCOLOGY

## 2014-08-09 VITALS — BP 130/70 | HR 110 | Temp 97.0°F | Ht 61.0 in | Wt 126.8 lb

## 2014-08-09 DIAGNOSIS — C169 Malignant neoplasm of stomach, unspecified: Secondary | ICD-10-CM

## 2014-08-09 DIAGNOSIS — Z9221 Personal history of antineoplastic chemotherapy: Secondary | ICD-10-CM | POA: Insufficient documentation

## 2014-08-09 DIAGNOSIS — Z08 Encounter for follow-up examination after completed treatment for malignant neoplasm: Secondary | ICD-10-CM | POA: Insufficient documentation

## 2014-08-09 DIAGNOSIS — G4700 Insomnia, unspecified: Secondary | ICD-10-CM | POA: Insufficient documentation

## 2014-08-09 DIAGNOSIS — F329 Major depressive disorder, single episode, unspecified: Secondary | ICD-10-CM | POA: Insufficient documentation

## 2014-08-09 DIAGNOSIS — D869 Sarcoidosis, unspecified: Secondary | ICD-10-CM | POA: Insufficient documentation

## 2014-08-09 DIAGNOSIS — Z903 Acquired absence of stomach [part of]: Secondary | ICD-10-CM | POA: Insufficient documentation

## 2014-08-09 DIAGNOSIS — Z980 Intestinal bypass and anastomosis status: Secondary | ICD-10-CM | POA: Insufficient documentation

## 2014-08-09 DIAGNOSIS — F419 Anxiety disorder, unspecified: Secondary | ICD-10-CM | POA: Insufficient documentation

## 2014-08-09 DIAGNOSIS — Z923 Personal history of irradiation: Secondary | ICD-10-CM | POA: Insufficient documentation

## 2014-08-09 DIAGNOSIS — Z85028 Personal history of other malignant neoplasm of stomach: Secondary | ICD-10-CM | POA: Insufficient documentation

## 2014-08-09 LAB — COMPREHENSIVE METABOLIC PANEL, NON-FASTING
ALBUMIN: 3.5 g/dL (ref 3.5–5.0)
ALKALINE PHOSPHATASE: 71 U/L (ref <150)
ALT (SGPT): 7 U/L (ref <55)
ALT (SGPT): 7 U/L (ref <55)
ANION GAP: 10 mmol/L (ref 4–13)
AST (SGOT): 22 U/L (ref 8–41)
BILIRUBIN, TOTAL: 0.3 mg/dL (ref 0.3–1.3)
BUN/CREAT RATIO: 18 (ref 6–22)
BUN: 17 mg/dL (ref 8–25)
CALCIUM: 9.3 mg/dL (ref 8.5–10.4)
CARBON DIOXIDE: 18 mmol/L — ABNORMAL LOW (ref 22–32)
CHLORIDE: 111 mmol/L (ref 96–111)
CREATININE: 0.94 mg/dL (ref 0.49–1.10)
ESTIMATED GLOMERULAR FILTRATION RATE: >59 ml/min/1.73m2 (ref >59)
GLUCOSE,NONFAST: 121 mg/dL (ref 65–139)
POTASSIUM: 4.4 mmol/L (ref 3.5–5.1)
SODIUM: 139 mmol/L (ref 136–145)
SODIUM: 139 mmol/L (ref 136–145)
TOTAL PROTEIN: 6.6 g/dL (ref 6.4–8.3)

## 2014-08-09 LAB — CBC
HCT: 35.7 % (ref 33.5–45.2)
HGB: 11.4 g/dL (ref 11.2–15.2)
HGB: 11.4 g/dL (ref 11.2–15.2)
MCH: 31.2 pg (ref 27.4–33.0)
MCHC: 32 g/dL — ABNORMAL LOW (ref 32.5–35.8)
MCV: 97.6 fL (ref 78–100)
MCV: 97.6 fL (ref 78–100)
MPV: 9.7 fL (ref 7.5–11.5)
PLATELET COUNT: 176 10*3/uL (ref 140–450)
RBC: 3.66 MIL/uL (ref 3.63–4.92)
RDW: 15.7 % — ABNORMAL HIGH (ref 12.0–15.0)
WBC: 10.4 10*3/uL (ref 3.5–11.0)

## 2014-08-09 LAB — CARCINOEMBRYONIC ANTIGEN: CARCINOEMBRYONIC AG: 3.1 ng/mL (ref <5.0)

## 2014-08-09 LAB — PREALBUMIN: PREALBUMIN: 20.2 mg/dL (ref 18–45)

## 2014-08-09 LAB — C-REACTIVE PROTEIN(CRP),INFLAMMATION: C-REACTIVE PROTEIN (CRP),INFLAMMATION: 1.1 mg/L (ref <8.0)
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
## 2014-09-13 ENCOUNTER — Encounter (INDEPENDENT_AMBULATORY_CARE_PROVIDER_SITE_OTHER): Payer: Self-pay | Admitting: SURGICAL ONCOLOGY

## 2014-09-13 NOTE — Nurses Notes (Signed)
Received medical record request from [facility] for records of the last three clinic visits. Request faxed to [facility].

## 2015-02-07 ENCOUNTER — Encounter (INDEPENDENT_AMBULATORY_CARE_PROVIDER_SITE_OTHER): Payer: Self-pay | Admitting: SURGICAL ONCOLOGY

## 2015-02-14 ENCOUNTER — Encounter (INDEPENDENT_AMBULATORY_CARE_PROVIDER_SITE_OTHER): Payer: Self-pay | Admitting: SURGICAL ONCOLOGY

## 2015-02-20 ENCOUNTER — Emergency Department (HOSPITAL_COMMUNITY)
Admission: EM | Admit: 2015-02-20 | Discharge: 2015-02-21 | Disposition: A | Discharge status: Home / Self Care | Payer: Medicaid - Out of State | Attending: Emergency Medicine | Admitting: Emergency Medicine

## 2015-02-20 ENCOUNTER — Emergency Department (HOSPITAL_COMMUNITY): Payer: Medicaid - Out of State

## 2015-02-20 ENCOUNTER — Encounter (HOSPITAL_COMMUNITY): Payer: Self-pay | Admitting: *Deleted

## 2015-02-20 DIAGNOSIS — R1012 Left upper quadrant pain: Secondary | ICD-10-CM

## 2015-02-20 DIAGNOSIS — Z85028 Personal history of other malignant neoplasm of stomach: Secondary | ICD-10-CM | POA: Insufficient documentation

## 2015-02-20 DIAGNOSIS — Z79899 Other long term (current) drug therapy: Secondary | ICD-10-CM | POA: Insufficient documentation

## 2015-02-20 DIAGNOSIS — Z720 Tobacco use: Secondary | ICD-10-CM | POA: Insufficient documentation

## 2015-02-20 DIAGNOSIS — I10 Essential (primary) hypertension: Secondary | ICD-10-CM | POA: Insufficient documentation

## 2015-02-20 DIAGNOSIS — K920 Hematemesis: Secondary | ICD-10-CM

## 2015-02-20 DIAGNOSIS — K5090 Crohn's disease, unspecified, without complications: Secondary | ICD-10-CM | POA: Insufficient documentation

## 2015-02-20 DIAGNOSIS — Z3202 Encounter for pregnancy test, result negative: Secondary | ICD-10-CM | POA: Insufficient documentation

## 2015-02-20 DIAGNOSIS — Z862 Personal history of diseases of the blood and blood-forming organs and certain disorders involving the immune mechanism: Secondary | ICD-10-CM | POA: Insufficient documentation

## 2015-02-20 HISTORY — DX: Immune thrombocytopenic purpura: D69.3

## 2015-02-20 HISTORY — DX: Essential (primary) hypertension: I10

## 2015-02-20 HISTORY — DX: Malignant (primary) neoplasm, unspecified: C80.1

## 2015-02-20 HISTORY — DX: Crohn's disease, unspecified, without complications: K50.90

## 2015-02-20 HISTORY — DX: Sarcoidosis, unspecified: D86.9

## 2015-02-20 LAB — COMPREHENSIVE METABOLIC PANEL
ALT: 8 U/L — ABNORMAL LOW (ref 14–54)
AST: 23 U/L (ref 15–41)
Albumin: 4.2 g/dL (ref 3.5–5.0)
Alkaline Phosphatase: 81 U/L (ref 38–126)
Anion gap: 7 (ref 5–15)
BUN: 20 mg/dL (ref 6–20)
CHLORIDE: 115 mmol/L — AB (ref 101–111)
CO2: 18 mmol/L — ABNORMAL LOW (ref 22–32)
Calcium: 9.2 mg/dL (ref 8.9–10.3)
Creatinine, Ser: 1.09 mg/dL — ABNORMAL HIGH (ref 0.44–1.00)
Glucose, Bld: 116 mg/dL — ABNORMAL HIGH (ref 65–99)
POTASSIUM: 4.5 mmol/L (ref 3.5–5.1)
Sodium: 140 mmol/L (ref 135–145)
Total Bilirubin: 0.5 mg/dL (ref 0.3–1.2)
Total Protein: 7.3 g/dL (ref 6.5–8.1)

## 2015-02-20 LAB — CBC
HEMATOCRIT: 37.2 % (ref 36.0–46.0)
Hemoglobin: 12.2 g/dL (ref 12.0–15.0)
MCH: 31.4 pg (ref 26.0–34.0)
MCHC: 32.8 g/dL (ref 30.0–36.0)
MCV: 95.6 fL (ref 78.0–100.0)
PLATELETS: 290 10*3/uL (ref 150–400)
RBC: 3.89 MIL/uL (ref 3.87–5.11)
RDW: 14.2 % (ref 11.5–15.5)
WBC: 11 10*3/uL — AB (ref 4.0–10.5)

## 2015-02-20 LAB — ABO/RH: ABO/RH(D): O POS

## 2015-02-20 LAB — TYPE AND SCREEN
ABO/RH(D): O POS
ANTIBODY SCREEN: NEGATIVE

## 2015-02-20 LAB — POC URINE PREG, ED: Preg Test, Ur: NEGATIVE

## 2015-02-20 MED ORDER — ONDANSETRON 4 MG PO TBDP: 4.0000 mg | ORAL_TABLET | Freq: Once | ORAL | Status: DC

## 2015-02-20 MED ORDER — HALOPERIDOL 2 MG PO TABS
2.0000 mg | ORAL_TABLET | Freq: Once | ORAL | Status: AC
  Administered 2015-02-20: 2 mg via ORAL
  Filled 2015-02-20: qty 1

## 2015-02-20 MED ORDER — HYDROMORPHONE HCL 1 MG/ML IJ SOLN
1.0000 mg | Freq: Once | INTRAMUSCULAR | Status: AC
  Administered 2015-02-20: 1 mg via INTRAVENOUS
  Filled 2015-02-20: qty 1

## 2015-02-20 MED ORDER — PANTOPRAZOLE SODIUM 40 MG IV SOLR
40.0000 mg | Freq: Once | INTRAVENOUS | Status: AC
  Administered 2015-02-20: 40 mg via INTRAVENOUS
  Filled 2015-02-20: qty 40

## 2015-02-20 MED ORDER — ONDANSETRON HCL 4 MG/2ML IJ SOLN
4.0000 mg | Freq: Once | INTRAMUSCULAR | Status: AC
  Administered 2015-02-20: 4 mg via INTRAVENOUS
  Filled 2015-02-20: qty 2

## 2015-02-20 NOTE — ED Provider Notes (Signed)
CSN: [id number]     Arrival date & time 02/20/15  2027 History   First MD Initiated Contact with Patient 02/20/15 2053     Chief Complaint  Patient presents with  . Hematemesis     (Consider location/radiation/quality/duration/timing/severity/associated sxs/prior Treatment) HPI   Kristina Graves is a 39 y/o female presenting today with complaint of hematemesis. PMH of stage 3 adenocarcinoma of the stomach, Crohn's, sarcoidosis, and HTN. She is S/P 2 small bowel resections and a gastrectomy. Has been consistently vomiting after eating over the past 2 weeks, but hematemesis began today. Brought video of emesis with her which shows a few streaks of blood. Also complains of nausea, LUQ pain today - 10/10 in severity, as well as dizziness, weakness, and chills. Has been taking oxycodone 15 for pain, but started running low and recently switched to high doses of ibuprofen and acetaminophen over the past few days. Is very anxious and worried this may be a recurrence of her cancer. Also reports BP has been elevated recently and took 6 of her BP pills today. Was being treated for her stomach cancer in [place] at [facility], but moved to [employer] 1 week ago.Denies coffee ground or profuse hematemesis. Denies alcohol use. 1 episode of watery diarrhea that began here in the ED. She denies hematochezia or melena. Denies fevers, SOB, urinary sxs.  Past Medical History  Diagnosis Date  . Cancer     stomach   . Hypertension   . Crohn disease   . Sarcoidosis   . ITP (idiopathic thrombocytopenic purpura)    Past Surgical History  Procedure Laterality Date  . Colon reconstruction    . Oophorectomy     No family history on file. Social History  Substance Use Topics  . Smoking status: Current Every Day Smoker    Types: Cigarettes  . Smokeless tobacco: None  . Alcohol Use: No   OB History    No data available     Review of Systems  Ten systems reviewed and are negative for acute change, except as noted in the  HPI.    Allergies  Review of patient's allergies indicates no known allergies.  Home Medications   Prior to Admission medications   Medication Sig Start Date End Date Taking? Authorizing Provider  acetaminophen (TYLENOL) 325 MG tablet Take 650 mg by mouth every 6 (six) hours as needed for moderate pain.   Yes Historical Provider, MD  atenolol (TENORMIN) 50 MG tablet Take 50 mg by mouth daily.   Yes Historical Provider, MD  diltiazem (CARTIA XT) 240 MG 24 hr capsule Take 240 mg by mouth daily.   Yes Historical Provider, MD  ferrous fumarate (HEMOCYTE - 106 MG FE) 325 (106 FE) MG TABS tablet Take 1 tablet by mouth daily.   Yes Historical Provider, MD  hydrochlorothiazide (HYDRODIURIL) 25 MG tablet Take 25 mg by mouth daily.   Yes Historical Provider, MD  ibuprofen (ADVIL,MOTRIN) 200 MG tablet Take 800 mg by mouth every 6 (six) hours as needed for moderate pain.   Yes Historical Provider, MD  lisinopril (PRINIVIL,ZESTRIL) 40 MG tablet Take 40 mg by mouth daily.   Yes Historical Provider, MD  oxyCODONE (ROXICODONE) 15 MG immediate release tablet Take 15 mg by mouth every 4 (four) hours as needed for pain.   Yes Historical Provider, MD  Pancrelipase, Lip-Prot-Amyl, (CREON) 24000 UNITS CPEP Take 2 capsules by mouth 3 (three) times daily.   Yes Historical Provider, MD  pantoprazole (PROTONIX) 40 MG tablet Take 40 mg  by mouth daily.   Yes Historical Provider, MD  promethazine (PHENERGAN) 25 MG tablet Take 25 mg by mouth every 6 (six) hours as needed for nausea or vomiting.   Yes Historical Provider, MD   BP 122/81 mmHg  Pulse 100  Temp(Src) 98.3 F (36.8 C) (Oral)  Resp 20  SpO2 100% Physical Exam  Constitutional: She is oriented to person, place, and time. She appears well-developed and well-nourished. No distress.  HENT:  Head: Normocephalic and atraumatic.  Eyes: Conjunctivae are normal. No scleral icterus.  Neck: Normal range of motion.  Cardiovascular: Normal rate, regular rhythm and  normal heart sounds.  Exam reveals no gallop and no friction rub.   No murmur heard. Pulmonary/Chest: Effort normal and breath sounds normal. No respiratory distress.  Abdominal: Soft. She exhibits no distension and no mass. There is tenderness (Diffuse). There is no guarding.  Hyperactive bowel sounds  Neurological: She is alert and oriented to person, place, and time.  Skin: Skin is warm and dry. She is not diaphoretic.  Psychiatric:  Very anxious. Pressured speech. Tearful.  Nursing note and vitals reviewed.   ED Course  Procedures (including critical care time) Labs Review Labs Reviewed  COMPREHENSIVE METABOLIC PANEL - Abnormal; Notable for the following:    Chloride 115 (*)    CO2 18 (*)    Glucose, Bld 116 (*)    Creatinine, Ser 1.09 (*)    ALT 8 (*)    All other components within normal limits  CBC - Abnormal; Notable for the following:    WBC 11.0 (*)    All other components within normal limits  POC URINE PREG, ED  TYPE AND SCREEN  ABO/RH    Imaging Review No results found. I have personally reviewed and evaluated these images and lab results as part of my medical decision-making.   EKG Interpretation None      MDM   Final diagnoses:  None    Patient with Mild hematemesis. She is extremely anxious due to her medical hystory and feels that this may be a recurrence of her cancer as it is located in the LUQ. I was able to obtain her records for the last 3 visits at [facility]. She was diagnosed with adenocarcinoma of the stomach.   11:56 PM Patient's labs and imaging reviewed. Xray appears to have some laddering. Will obtain a CT abdomen. Creatinine is slightly elevated   Ct pending, Ihave give sign out to [place], PA-C 02/21/15 [place], DO 02/23/15 1108

## 2015-02-20 NOTE — ED Notes (Signed)
Pt reports she has been vomiting x 1 week but started to vomit blood today.  Pt has stage III stomach cancer and is not able to tolerate chemo therapy.  Pt also reports that her BP has been elevated, has taken 6 of her BP meds today.  Pt also severe abd pain.

## 2015-02-20 NOTE — ED Notes (Signed)
Pt wants labs to be drawn from her port

## 2015-02-20 NOTE — ED Notes (Signed)
Pt is aware an urine sample is needed but unable to urinate.

## 2015-02-20 NOTE — ED Notes (Signed)
Delay on lab dray

## 2015-02-21 ENCOUNTER — Encounter (HOSPITAL_COMMUNITY): Payer: Self-pay

## 2015-02-21 MED ORDER — IOHEXOL 300 MG/ML  SOLN
25.0000 mL | Freq: Once | INTRAMUSCULAR | Status: AC | PRN
  Administered 2015-02-21: 25 mL via ORAL

## 2015-02-21 MED ORDER — DICYCLOMINE HCL 20 MG PO TABS: 20.0000 mg | ORAL_TABLET | Freq: Two times a day (BID) | ORAL | Status: DC

## 2015-02-21 MED ORDER — HEPARIN SOD (PORK) LOCK FLUSH 100 UNIT/ML IV SOLN
500.0000 [IU] | Freq: Once | INTRAVENOUS | Status: AC
  Administered 2015-02-21: 500 [IU]
  Filled 2015-02-21: qty 5

## 2015-02-21 MED ORDER — CIPROFLOXACIN HCL 500 MG PO TABS: 500.0000 mg | ORAL_TABLET | Freq: Two times a day (BID) | ORAL | Status: DC

## 2015-02-21 MED ORDER — PROMETHAZINE HCL 25 MG PO TABS: 25.0000 mg | ORAL_TABLET | Freq: Four times a day (QID) | ORAL | Status: DC | PRN

## 2015-02-21 MED ORDER — SODIUM CHLORIDE 0.9 % IV BOLUS (SEPSIS)
1000.0000 mL | Freq: Once | INTRAVENOUS | Status: AC
  Administered 2015-02-21: 1000 mL via INTRAVENOUS

## 2015-02-21 MED ORDER — METRONIDAZOLE 500 MG PO TABS: 500.0000 mg | ORAL_TABLET | Freq: Three times a day (TID) | ORAL | Status: DC

## 2015-02-21 MED ORDER — KETOROLAC TROMETHAMINE 30 MG/ML IJ SOLN
30.0000 mg | Freq: Once | INTRAMUSCULAR | Status: AC
  Administered 2015-02-21: 30 mg via INTRAVENOUS
  Filled 2015-02-21: qty 1

## 2015-02-21 MED ORDER — OXYCODONE-ACETAMINOPHEN 5-325 MG PO TABS
2.0000 | ORAL_TABLET | Freq: Once | ORAL | Status: AC
  Administered 2015-02-21: 2 via ORAL
  Filled 2015-02-21: qty 2

## 2015-02-21 MED ORDER — TRAMADOL HCL 50 MG PO TABS: 50.0000 mg | ORAL_TABLET | Freq: Four times a day (QID) | ORAL | Status: DC | PRN

## 2015-02-21 MED ORDER — IOHEXOL 300 MG/ML  SOLN
100.0000 mL | Freq: Once | INTRAMUSCULAR | Status: AC | PRN
  Administered 2015-02-21: 100 mL via INTRAVENOUS

## 2015-02-21 NOTE — ED Notes (Signed)
After CT scan, pt reported she was still in pain and having diarrhea. No visualization of patient ambulating to the restroom to use the restroom.

## 2015-02-21 NOTE — Discharge Instructions (Signed)
Abdominal (belly) pain can be caused by many things. Your caregiver performed an examination and possibly ordered blood/urine tests and imaging (CT scan, x-rays, ultrasound). Many cases can be observed and treated at home after initial evaluation in the emergency department. Even though you are being discharged home, abdominal pain can be unpredictable. Therefore, you need a repeated exam if your pain does not resolve, returns, or worsens. Most patients with abdominal pain don't have to be admitted to the hospital or have surgery, but serious problems like appendicitis and gallbladder attacks can start out as nonspecific pain. Many abdominal conditions cannot be diagnosed in one visit, so follow-up evaluations are very important. SEEK IMMEDIATE MEDICAL ATTENTION IF: The pain does not go away or becomes severe.  A temperature above 101 develops.  Repeated vomiting occurs (multiple episodes).  The pain becomes localized to portions of the abdomen. The right side could possibly be appendicitis. In an adult, the left lower portion of the abdomen could be colitis or diverticulitis.  Blood is being passed in stools or vomit (bright red or black tarry stools).  Return also if you develop chest pain, difficulty breathing, dizziness or fainting, or become confused, poorly responsive, or inconsolable (young children).  Hematemesis This condition is the vomiting of blood. CAUSES  This can happen if you have a peptic ulcer or an irritation of the throat, stomach, or small bowel. Vomiting over and over again or swallowing blood from a nosebleed, coughing or facial injury can also result in bloody vomit. Anti-inflammatory pain medicines are a common cause of this potentially dangerous condition. The most serious causes of vomiting blood include:  Ulcers (a bacteria called H. pylori is common cause of ulcers).  Clotting problems.  Alcoholism.  Cirrhosis. TREATMENT  Treatment depends on the cause and the  severity of the bleeding. Small amounts of blood streaks in the vomit is not the same as vomiting large amounts of bloody or dark, coffee grounds-like material. Weakness, fainting, dehydration, anemia, and continued alcohol or drug use increase the risk. Examination may include blood, vomit, or stool tests. The presence of bloody or dark stool that tests positive for blood (Hemoccult) means the bleeding has been going on for some time. Endoscopy and imaging studies may be done. Emergency treatment may include:  IV medicines or fluids.  Blood transfusions.  Surgery. Hospital care is required for high risk patients or when IV fluids or blood is needed. Upper GI bleeding can cause shock and death if not controlled. HOME CARE INSTRUCTIONS   Your treatment does not require hospital care at this time.  Remain at rest until your condition improves.  Drink clear liquids as tolerated.  Avoid:  Alcohol.  Nicotine.  Aspirin.  Any other anti-inflammatory medicine (ibuprofen, naproxen, and many others).  Medications to suppress stomach acid or vomiting may be needed. Take all your medicine as prescribed.  Be sure to see your caregiver for follow-up as recommended. SEEK IMMEDIATE MEDICAL CARE IF:   You have repeated vomiting, dehydration, fainting, or extreme weakness.  You are vomiting large amounts of bloody or dark material.  You pass large, dark or bloody stools. Document Released: 07/08/2004 Document Revised: 08/23/2011 Document Reviewed: 07/24/2008 [facility] Patient Information 2015 [place], [place]. This information is not intended to replace advice given to you by your health care provider. Make sure you discuss any questions you have with your health care provider.  Mallory-Weiss Syndrome Mallory-Weiss syndrome refers to bleeding from tears in the lining of the esophagus near where it  meets the stomach. This is often caused by forceful vomiting, retching or coughing. This condition  is often associated with alcoholism. Usually the bleeding stops by itself after 24 to 48 hours. Sometimes endoscopic or surgical treatment is needed. This condition is not usually fatal. SYMPTOMS  Vomiting of bright red or black coffee ground like material.  Black, tarry stools.  Low blood pressure causing you to feel faint or experience loss of consciousness. DIAGNOSIS  Definitive diagnosis is by endoscopy. Treatment is usually supportive. Persistent bleeding is uncommon. Sometimes cauterization or injection of epinephrine to stop the bleeding is used during the diagnostic endoscopy. Embolization (obstruction) of the arteries supplying the area of bleeding is sometimes used to stop the bleeding.  An NG tube (naso-gastric tube) may be inserted to determine where the bleeding is coming from.  Often an EGD (esophagogastroduodenoscopy) is done. In this procedure there is a small flexible tube-like telescope (endoscope) put into your mouth, through your esophagus (the food tube leading from your mouth to your stomach), down into your stomach and into the small bowel. Through this your caregiver can see what and where the problem is. TREATMENT  It is necessary to stop the bleeding as soon as possible. During the EGD, your caregiver may inject medication into bleeding vessels to clot them.  SEEK IMMEDIATE MEDICAL CARE IF:  You have persistent dizziness, lightheadedness, or fainting.  Your vomiting returns and you have blood in your stools.  You have vomit that is bright red blood or black coffee ground-like blood, bright red blood in the stool or black tarry stools.  You have chest pain.  You cannot eat or drink.  You have nausea or vomiting. MAKE SURE YOU:   Understand these instructions.  Will watch your condition.  Will get help right away if you are not doing well or get worse. Document Released: 10/18/2005 Document Revised: 08/23/2011 Document Reviewed: 07/25/2013 [facility]  Patient Information 2015 [place], [place]. This information is not intended to replace advice given to you by your health care provider. Make sure you discuss any questions you have with your health care provider.  Nausea and Vomiting Nausea is a sick feeling that often comes before throwing up (vomiting). Vomiting is a reflex where stomach contents come out of your mouth. Vomiting can cause severe loss of body fluids (dehydration). Children and elderly adults can become dehydrated quickly, especially if they also have diarrhea. Nausea and vomiting are symptoms of a condition or disease. It is important to find the cause of your symptoms. CAUSES   Direct irritation of the stomach lining. This irritation can result from increased acid production (gastroesophageal reflux disease), infection, food poisoning, taking certain medicines (such as nonsteroidal anti-inflammatory drugs), alcohol use, or tobacco use.  Signals from the brain.These signals could be caused by a headache, heat exposure, an inner ear disturbance, increased pressure in the brain from injury, infection, a tumor, or a concussion, pain, emotional stimulus, or metabolic problems.  An obstruction in the gastrointestinal tract (bowel obstruction).  Illnesses such as diabetes, hepatitis, gallbladder problems, appendicitis, kidney problems, cancer, sepsis, atypical symptoms of a heart attack, or eating disorders.  Medical treatments such as chemotherapy and radiation.  Receiving medicine that makes you sleep (general anesthetic) during surgery. DIAGNOSIS Your caregiver may ask for tests to be done if the problems do not improve after a few days. Tests may also be done if symptoms are severe or if the reason for the nausea and vomiting is not clear. Tests may include:  Urine tests.  Blood tests.  Stool tests.  Cultures (to look for evidence of infection).  X-rays or other imaging studies. Test results can help your caregiver make  decisions about treatment or the need for additional tests. TREATMENT You need to stay well hydrated. Drink frequently but in small amounts.You may wish to drink water, sports drinks, clear broth, or eat frozen ice pops or gelatin dessert to help stay hydrated.When you eat, eating slowly may help prevent nausea.There are also some antinausea medicines that may help prevent nausea. HOME CARE INSTRUCTIONS   Take all medicine as directed by your caregiver.  If you do not have an appetite, do not force yourself to eat. However, you must continue to drink fluids.  If you have an appetite, eat a normal diet unless your caregiver tells you differently.  Eat a variety of complex carbohydrates (rice, wheat, potatoes, bread), lean meats, yogurt, fruits, and vegetables.  Avoid high-fat foods because they are more difficult to digest.  Drink enough water and fluids to keep your urine clear or pale yellow.  If you are dehydrated, ask your caregiver for specific rehydration instructions. Signs of dehydration may include:  Severe thirst.  Dry lips and mouth.  Dizziness.  Dark urine.  Decreasing urine frequency and amount.  Confusion.  Rapid breathing or pulse. SEEK IMMEDIATE MEDICAL CARE IF:   You have blood or brown flecks (like coffee grounds) in your vomit.  You have black or bloody stools.  You have a severe headache or stiff neck.  You are confused.  You have severe abdominal pain.  You have chest pain or trouble breathing.  You do not urinate at least once every 8 hours.  You develop cold or clammy skin.  You continue to vomit for longer than 24 to 48 hours.  You have a fever. MAKE SURE YOU:   Understand these instructions.  Will watch your condition.  Will get help right away if you are not doing well or get worse. Document Released: 05/31/2005 Document Revised: 08/23/2011 Document Reviewed: 10/28/2010 [facility] Patient Information 2015 [place], [place]. This  information is not intended to replace advice given to you by your health care provider. Make sure you discuss any questions you have with your health care provider.

## 2015-02-28 ENCOUNTER — Encounter (INDEPENDENT_AMBULATORY_CARE_PROVIDER_SITE_OTHER): Payer: Medicaid Other | Admitting: SURGICAL ONCOLOGY

## 2015-10-06 ENCOUNTER — Ambulatory Visit: Payer: Medicaid Other | Attending: SURGICAL ONCOLOGY | Admitting: SURGICAL ONCOLOGY

## 2015-10-06 VITALS — BP 110/80 | Temp 96.8°F | Ht 61.0 in | Wt 131.6 lb

## 2015-10-06 DIAGNOSIS — Z7952 Long term (current) use of systemic steroids: Secondary | ICD-10-CM | POA: Insufficient documentation

## 2015-10-06 DIAGNOSIS — R634 Abnormal weight loss: Secondary | ICD-10-CM | POA: Insufficient documentation

## 2015-10-06 DIAGNOSIS — Z9889 Other specified postprocedural states: Secondary | ICD-10-CM | POA: Insufficient documentation

## 2015-10-06 DIAGNOSIS — K3189 Other diseases of stomach and duodenum: Secondary | ICD-10-CM | POA: Insufficient documentation

## 2015-10-06 DIAGNOSIS — R112 Nausea with vomiting, unspecified: Secondary | ICD-10-CM | POA: Insufficient documentation

## 2015-10-06 DIAGNOSIS — Z85028 Personal history of other malignant neoplasm of stomach: Secondary | ICD-10-CM | POA: Insufficient documentation

## 2015-10-06 DIAGNOSIS — R1013 Epigastric pain: Secondary | ICD-10-CM | POA: Insufficient documentation

## 2015-10-06 DIAGNOSIS — Z9109 Other allergy status, other than to drugs and biological substances: Secondary | ICD-10-CM | POA: Insufficient documentation

## 2015-10-06 DIAGNOSIS — D869 Sarcoidosis, unspecified: Secondary | ICD-10-CM | POA: Insufficient documentation

## 2015-10-06 DIAGNOSIS — Z7951 Long term (current) use of inhaled steroids: Secondary | ICD-10-CM | POA: Insufficient documentation

## 2015-10-06 DIAGNOSIS — F329 Major depressive disorder, single episode, unspecified: Secondary | ICD-10-CM | POA: Insufficient documentation

## 2015-10-06 DIAGNOSIS — C169 Malignant neoplasm of stomach, unspecified: Secondary | ICD-10-CM

## 2015-10-06 DIAGNOSIS — Z79891 Long term (current) use of opiate analgesic: Secondary | ICD-10-CM | POA: Insufficient documentation

## 2015-10-06 DIAGNOSIS — K5090 Crohn's disease, unspecified, without complications: Secondary | ICD-10-CM | POA: Insufficient documentation

## 2015-10-06 DIAGNOSIS — Z6824 Body mass index (BMI) 24.0-24.9, adult: Secondary | ICD-10-CM

## 2015-10-06 DIAGNOSIS — F419 Anxiety disorder, unspecified: Secondary | ICD-10-CM | POA: Insufficient documentation

## 2015-10-06 DIAGNOSIS — Z79899 Other long term (current) drug therapy: Secondary | ICD-10-CM | POA: Insufficient documentation

## 2015-10-06 DIAGNOSIS — Z903 Acquired absence of stomach [part of]: Secondary | ICD-10-CM | POA: Insufficient documentation

## 2015-10-06 DIAGNOSIS — K5900 Constipation, unspecified: Secondary | ICD-10-CM | POA: Insufficient documentation

## 2015-10-06 NOTE — Progress Notes (Addendum)
SURGICAL ONCOLOGY FOLLOW-UP EVALUATION:    PATIENT:  Michele Mason  MRN:  [id number]  DOB:   July 16, 1975  DATE:  10/06/2015    CANCER DIAGNOSIS AND STAGE:   1. Stage IIA, pT3 pN0 cM0, intestinal type gastric cancer  2. Sarcoidosis  3. Remote Hx of Crohn's disease    SURGICAL THERAPY:   1. 07/19/2012 - exploratory laparotomy, lysis of adhesions, subtotal gastrectomy, D2 lymphadenectomy, umbilical hernia repair  2. 07/24/2012 - R IJ port    SYSTEMIC THERAPY: 2 weeks of 5-FU (discontinued due to toxicity of the skin) by Dr. Frutoso Chase [facility])     RADIATION THERAPY: 11/09/2012 - 12/28/2012, unknown total dose (presumably 50.4 Gy) by Dr. Adah Salv[facility])     CLINICAL TRIAL: None.     CHIEF COMPLAINT: Nausea and vomiting    HISTORY OF PRESENT ILLNESS:  Michele Mason is a 40 y.o. 89 American female who returns to clinic today for evaluation of nausea, vomiting and abdominal pain. Patient was last seen in our clinic on 08/09/14 and was lost to follow-up due to incarceration. Patient sates that since October 2016 she has been having intermittent abdominal pain, nausea and vomiting. She was briefly admitted to [facility] in October 2016 for pancreatitis. Since discharge, she has presented to the ED there multiple time due to abdominal pain, n/v. Last week she presented to [facility] for similar symptoms, CT scan was obtained showing concerns for 1.8 x 2.9 cm soft tissue density of the lesser curvature of the stomach and was started on a ABX for UTI. She admits to a 14 lbs weight loss in the past 3 months.     REVIEW OF SYSTEMS:  Constitutional: cooperative, denies fevers, chills or weight loss  HEENT: denies headache, dizziness, double vision, tinnitus, nasal congestion, or difficulty swallowing  Cardiovascular: denies chest pain, palpitations, dyspnea on exertion  Respiratory: denies SOB, cough  Gastrointestinal (GI):  endorses  epigastric abdominal pain, positive for nausea, vomiting, constipation  Genitourinary (GU): denies dysuria, hematuria  Gynecologic (GYN): denies vaginal discharge, abnormal vaginal bleeding  Musculoskeletal:  endorses joint pain, right knee pain, bilateral hand pain; denies weakness  Endocrine: denies polydipsia, polyuria  Neurological: denies dizziness, facial droop, focal motor/sensory deficits  Psychiatric:  endorses severe anxiety, some discretion, and significant sleeping disturbances    PAST MEDICAL, PAST SURGICAL, FAMILY, AND SOCIAL HISTORY:  The patient denies any changes to this portion of her history with the exception of above mentioned since her visit on 08/09/14.    HOME MEDICATIONS:  Current Outpatient Prescriptions   Medication Sig    albuterol sulfate (PROVENTIL OR VENTOLIN) 90 mcg/actuation Inhalation HFA Aerosol Inhaler Take 1-2 Puffs by inhalation Every 6 hours as needed (Patient not taking: Reported on 10/06/2015)    albuterol sulfate (VENTOLIN) 5 mg/mL Inhalation Solution for Nebulization 2.5 mg by Nebulization route Every 4 hours as needed for Wheezing Reported on 10/06/2015    ALPRAZolam (XANAX) 1 mg Oral Tablet Take 1 mg by mouth Three times a day as needed for Insomnia Reported on 10/06/2015    amLODIPine (NORVASC) 5 mg Oral Tablet Take 5 mg by mouth Per instructions Reported on 10/06/2015    ATENOLOL ORAL Take by mouth    budesonide-formoterol (SYMBICORT) 160-4.5 mcg/actuation Inhalation HFA Aerosol Inhaler Take 2 Puffs by inhalation Twice daily (Patient not taking: Reported on 10/06/2015)    cholestyramine-aspartame (PREVALITE) 4 gram Oral Powder in Packet Take 4 g by mouth Every evening with  dinner Reported on 10/06/2015    diltiaZEM (CARTIA XT) 240 mg Oral Capsule, Sust. Release 24 hr Take 240 mg by mouth Once a day    Doxepin (SINEQUAN) 50 mg Oral Capsule Take 50 mg by mouth Every night Reported on 10/06/2015    ergocalciferol, vitamin D2, (DRISDOL) 50,000 unit Oral Capsule Take  50,000 Int'l Units by mouth Every 7 days Reported on 10/06/2015    ferrous sulfate (FERATAB) 324 mg (65 mg iron) Oral Tablet, Delayed Release (E.C.) Take 324 mg by mouth Twice daily Reported on 10/06/2015    hydroCHLOROthiazide (MICROZIDE) 12.5 mg Oral Capsule Take 25 mg by mouth Once a day    Ipratropium-Albuterol 0.5 mg-3 mg(2.5 mg base)/3 mL Inhalation Solution for Nebulization 3 mL by Nebulization route Three times a day as needed for Wheezing Reported on 10/06/2015    levoFLOXacin (LEVAQUIN) 500 mg Oral Tablet Take 500 mg by mouth Once a day    lisinopril (PRINIVIL) 20 mg Oral Tablet Take 20 mg by mouth Once a day    MEGESTROL ACETATE (MEGACE ORAL) Take by mouth Reported on 10/06/2015    metoclopramide HCl (REGLAN) 10 mg Oral Tablet Take 10 mg by mouth Four times a day - before meals and bedtime Reported on 10/06/2015    multivitamin Oral Tablet Take 1 Tab by mouth Once a day Reported on 10/06/2015    oxyCODONE (OXY IR) 5 mg Oral Capsule Take 15 mg by mouth Every 6 hours as needed for Pain Reported on 10/06/2015    pancreatic enzyme replacement (CREON) 12,000-38,000 -60,000 unit Oral Capsule, Delayed Release(E.C.) Take 2 Caps by mouth Three times a day (Patient not taking: Reported on 10/06/2015)    pantoprazole (PROTONIX) 40 mg Oral Tablet, Delayed Release (E.C.) Take 40 mg by mouth Once a day Reported on 10/06/2015    POLYETHYLENE GLYCOL 3350 (MIRALAX ORAL) Take by mouth    predniSONE (DELTASONE) 20 mg Oral Tablet Take 20 mg by mouth Twice daily Reported on 10/06/2015    PROMETHAZINE HCL (PHENERGAN ORAL) Take by mouth    sertraline (ZOLOFT) 25 mg Oral Tablet Take 25 mg by mouth Once a day Reported on 10/06/2015    sucralfate (CARAFATE) 1 gram Oral Tablet Take 1 g by mouth Twice a day before meals Reported on 10/06/2015       ALLERGIES:  Allergies   Allergen Reactions    Adhesive Rash and Itching       PHYSICAL EXAMINATION:  ECOG Performance Status: 1 - Symptomatic but completely ambulatory   Karnofsky  Performance Status: 90% - capable of normal activity, few symptoms or signs of disease  General: pleasant, well-developed, well-nourished 51 American female who is in no acute distress.    Vital Signs: BP 110/80   Temp 36 C (96.8 F)   Ht 1.549 m (5\' 1" )   Wt 59.7 kg (131 lb 9.8 oz)   BMI 24.87 kg/m2  Neck: supple, trachea is midline, thyroid is symmetric, not enlarged, and without nodularity  Lymphatics: no palpable lymphadenopathy of the cervical, supraclavicular, axillary, or inguinal nodal basins bilaterally  Cardiovascular: regular rate and rhythm, S1, S2 normal, no murmur, click, rub or gallop  Pulmonary: normal effort, chest expands symmetrically, lungs are clear to auscultation bilaterally.   Abdomen: non-distended, normal bowel sounds, no bruits, soft, mild TTP in epigastric area, no organomegaly or masses  Extremities: without deformity, cyanosis, or edema  Neurologic: alert and oriented to person, place, time, and situation, cranial nerves grossly intact, no focal motor or sensory  deficits  Psychiatric: speech pattern and movements are normal, normal mood, affect and judgment    LABORATORY REVIEW (I have personally reviewed all recent laboratory studies):  1. 08/09/2014 from [facility]: WBC 10.4, Hgb 11.4, plt 176, with normal MCV and differential. Na 139, K 4.4, Cl 111, bicarbonate 18, BUN 17, creatinine 0.94, glucose 121, calcium 9.3.  Total bilirubin 0.3, alkaline phosphatase 71, AST 22, ALT 7, albumin 3.5, prealbumin 20.2, CRP 1.1.  2. 04/19/2014 from [facility]: WBC 9.2, Hgb 11.3, plt 231, with normal MCV and differential. Na 139, K 4.0, Cl 108, bicarbonate 20, BUN 11, creatinine 0.83, glucose 104, calcium 8.4.  Total bilirubin 0.3, alkaline phosphatase 117, AST 22, ALT 15, albumin 2.4, CEA 3.6, CRP 16.4.    IMAGING REVIEW (I have personally reviewed both the reports and images):  1. 09/28/2015 CT abdomen/pelvis ([facility]): 1.8 x 2.9 cm soft tissue density of the lesser curvature.   2. 04/19/2014 PET/CT  from [facility]: No PET/CT evidence to suggest active malignancy. Diffuse bowel activity, most intense in the descending and sigmoid portions of the colon. This is most likely related to chronic inflammation given the patient's history of inflammatory bowel disease. There is otherwise no evidence to suggest acute inflammation or tumor, however.    OTHER STUDIES REVIEW:  None.    PATHOLOGY REVIEW:  None.    ASSESSMENT:  40 y.o. 55 American female with stage IIA, pT3 pN0 cM0, intestinal type gastric cancer.  It appears that her prior diarrhea and GI symptoms may be secondary to pancreatic enzyme deficiency following gastrojejunostomy.  She is no evidence of recurrent disease.    PLAN:  1. Patient's imaging and physical exam were discussed with her and her family today. Given the new finding of possible soft tissue density we will arrange for EGD.  2. We instructed that if she continues to have issues with PO intake that she should convert to a liquid diet and that she could take boost or ensure to supplement.   3. We also discussed with her the importance of contacting our office when new and acute issues arise.   4. I have discussed her comorbid conditions and how they may impact surgical healing or further therapy, including sarcoidosis, severe anxiety, and a possible Crohn's disease recurrence.  5. We will follow-up with the patient after EGD  6. All of the patients and her family members questions were answered to best of my ability and to their satisfaction. They seem to understand the disease process and the above mentioned treatment algorithm. After deliberation, she agrees to proceed as outlined.  7. The patient has my business card containing all of my professional contact information and I have instructed her to call with new or worsening symptoms, questions, or concerns.  8. The total time for this visit was 25 minutes and of that, greater than 50% was dedicated to the discussion of her disease and  follow-up plan. As always, the patient has a litany of questions, concerns, and requires a significant amount of time due to severe anxiety, depression, and difficulty relating true symptomatology.    Allene Pyo, PA-C  10/06/2015, 16:20    I saw and examined this patient with the physician assistant above.  Please see the physician assistant's note, which I have carefully reviewed, for full details. I agree with the findings and plan of care as documented in the physician assistant's note.  Any additions/exceptions are edited/noted.    I know Ms. Pluchino very well following partial gastrectomy for  gastric cancer in February of 2014. She has had chronic GI issues given a history of sarcoidosis, Crohn's disease, and her gastric malignancy.  Over the past 1 year, her follow up with me has been sporadic, at best given the distance that she lives, her psychiatric issues with severe, debilitating anxiety and she was incarcerated for a period of time.  I believe she also was off of significant medication regimens for a large chunk of this time.  She was recently seen by a local physician and a CT scan was performed demonstrating a questionable gastric mass.  She was sent to me for further evaluation.  In discussing her symptoms, she describes epigastric pain with eating, some vomiting.  On examination, her abdomen is soft, nondistended, she she has mild to moderate epigastric abdominal tenderness without peritoneal inflammation, I cannot palpate a distinct hernia today.  I reviewed her CT scan, and the area commented upon in the report is questionable in my opinion, there is no other signs of malignancy or metastatic disease.  I will plan to send her for an EGD to evaluate the stomach and gastric jejunal anastomosis for potential stricture.  She should continue twice daily PPI therapy along with Carafate 4 times daily for symptom relief and control. In Ms Demartini, this may need to be chronic.    Illene Silver, MD  10/15/2015 10:19  Assistant Professor of Surgery  Division of [place]

## 2015-10-08 ENCOUNTER — Encounter (HOSPITAL_COMMUNITY): Payer: Self-pay

## 2015-10-08 ENCOUNTER — Ambulatory Visit
Admission: RE | Admit: 2015-10-08 | Discharge: 2015-10-08 | Disposition: A | Discharge status: Home / Self Care | Payer: Medicaid Other | Source: Ambulatory Visit

## 2015-10-14 ENCOUNTER — Ambulatory Visit (HOSPITAL_COMMUNITY): Payer: Medicaid Other | Admitting: Physician Assistant

## 2015-10-14 ENCOUNTER — Ambulatory Visit
Admission: RE | Admit: 2015-10-14 | Discharge: 2015-10-14 | Disposition: A | Discharge status: Home / Self Care | Payer: Medicaid Other | Source: Ambulatory Visit | Attending: Gastroenterology | Admitting: Gastroenterology

## 2015-10-14 ENCOUNTER — Other Ambulatory Visit (INDEPENDENT_AMBULATORY_CARE_PROVIDER_SITE_OTHER): Payer: Self-pay | Admitting: Gastroenterology

## 2015-10-14 ENCOUNTER — Ambulatory Visit (HOSPITAL_BASED_OUTPATIENT_CLINIC_OR_DEPARTMENT_OTHER): Payer: Medicaid Other | Admitting: Physician Assistant

## 2015-10-14 ENCOUNTER — Encounter (HOSPITAL_COMMUNITY): Payer: Self-pay

## 2015-10-14 ENCOUNTER — Ambulatory Visit (HOSPITAL_BASED_OUTPATIENT_CLINIC_OR_DEPARTMENT_OTHER): Payer: Medicaid Other | Admitting: Gastroenterology

## 2015-10-14 ENCOUNTER — Other Ambulatory Visit (HOSPITAL_COMMUNITY): Payer: Self-pay | Admitting: Gastroenterology

## 2015-10-14 ENCOUNTER — Encounter (HOSPITAL_COMMUNITY)
Admission: RE | Disposition: A | Discharge status: Home / Self Care | Payer: Self-pay | Source: Ambulatory Visit | Attending: Gastroenterology

## 2015-10-14 DIAGNOSIS — R1110 Vomiting, unspecified: Secondary | ICD-10-CM

## 2015-10-14 DIAGNOSIS — Z85028 Personal history of other malignant neoplasm of stomach: Secondary | ICD-10-CM | POA: Insufficient documentation

## 2015-10-14 DIAGNOSIS — Z86711 Personal history of pulmonary embolism: Secondary | ICD-10-CM | POA: Insufficient documentation

## 2015-10-14 DIAGNOSIS — R933 Abnormal findings on diagnostic imaging of other parts of digestive tract: Secondary | ICD-10-CM

## 2015-10-14 DIAGNOSIS — C169 Malignant neoplasm of stomach, unspecified: Secondary | ICD-10-CM

## 2015-10-14 DIAGNOSIS — R109 Unspecified abdominal pain: Secondary | ICD-10-CM | POA: Insufficient documentation

## 2015-10-14 DIAGNOSIS — I10 Essential (primary) hypertension: Secondary | ICD-10-CM | POA: Insufficient documentation

## 2015-10-14 DIAGNOSIS — K6389 Other specified diseases of intestine: Secondary | ICD-10-CM | POA: Insufficient documentation

## 2015-10-14 DIAGNOSIS — K2950 Unspecified chronic gastritis without bleeding: Secondary | ICD-10-CM | POA: Insufficient documentation

## 2015-10-14 DIAGNOSIS — Z86718 Personal history of other venous thrombosis and embolism: Secondary | ICD-10-CM | POA: Insufficient documentation

## 2015-10-14 DIAGNOSIS — I69354 Hemiplegia and hemiparesis following cerebral infarction affecting left non-dominant side: Secondary | ICD-10-CM | POA: Insufficient documentation

## 2015-10-14 HISTORY — DX: Unspecified asthma, uncomplicated: J45.909

## 2015-10-14 HISTORY — DX: Malignant (primary) neoplasm, unspecified (CMS HCC): C80.1

## 2015-10-14 HISTORY — DX: Presence of spectacles and contact lenses: Z97.3

## 2015-10-14 HISTORY — DX: Disorder of kidney and ureter, unspecified: N28.9

## 2015-10-14 SURGERY — GASTROSCOPY
Anesthesia: General | Site: Mouth | Wound class: Clean Contaminated Wounds-The respiratory, GI, Genital, or urinary

## 2015-10-14 MED ORDER — ONDANSETRON HCL (PF) 4 MG/2 ML INJECTION SOLUTION
Freq: Once | INTRAMUSCULAR | Status: DC | PRN
Start: 2015-10-14 — End: 2015-10-14
  Administered 2015-10-14: 4 mg via INTRAVENOUS

## 2015-10-14 MED ORDER — LIDOCAINE (PF) 100 MG/5 ML (2 %) INTRAVENOUS SYRINGE
INJECTION | Freq: Once | INTRAVENOUS | Status: DC | PRN
Start: 2015-10-14 — End: 2015-10-14
  Administered 2015-10-14: 20 mg via INTRAVENOUS

## 2015-10-14 MED ORDER — BENZOCAINE 20% METERED DOSE ORAL SPRAY
Freq: Once | ORAL | Status: DC | PRN
Start: 2015-10-14 — End: 2015-10-14
  Administered 2015-10-14: 2 via OROMUCOSAL

## 2015-10-14 MED ORDER — ESOMEPRAZOLE MAGNESIUM 20 MG CAPSULE,DELAYED RELEASE
20.00 mg | DELAYED_RELEASE_CAPSULE | Freq: Every morning | ORAL | 2 refills | Status: AC
Start: 2015-10-14 — End: 2015-11-13

## 2015-10-14 MED ORDER — PROPOFOL 10 MG/ML INTRAVENOUS EMULSION
INTRAVENOUS | Status: DC | PRN
Start: 2015-10-14 — End: 2015-10-14
  Administered 2015-10-14: 300 ug/kg/min via INTRAVENOUS
  Administered 2015-10-14: 200 ug/kg/min via INTRAVENOUS
  Administered 2015-10-14: 250 ug/kg/min via INTRAVENOUS
  Administered 2015-10-14: 0 ug/kg/min via INTRAVENOUS

## 2015-10-14 MED ORDER — PROPOFOL 10 MG/ML IV BOLUS
INJECTION | Freq: Once | INTRAVENOUS | Status: DC | PRN
Start: 2015-10-14 — End: 2015-10-14
  Administered 2015-10-14: 50 mg via INTRAVENOUS

## 2015-10-14 MED ORDER — SODIUM CHLORIDE 0.9 % (FLUSH) INJECTION SYRINGE
2.00 mL | INJECTION | Freq: Three times a day (TID) | INTRAMUSCULAR | Status: DC
Start: 2015-10-14 — End: 2015-10-14

## 2015-10-14 MED ORDER — LACTATED RINGERS INTRAVENOUS SOLUTION
INTRAVENOUS | Status: DC
Start: 2015-10-14 — End: 2015-10-14

## 2015-10-14 MED ORDER — GLYCOPYRROLATE 0.2 MG/ML INJECTION SOLUTION
Freq: Once | INTRAMUSCULAR | Status: DC | PRN
Start: 2015-10-14 — End: 2015-10-14
  Administered 2015-10-14: 0.2 mg via INTRAVENOUS

## 2015-10-14 MED ORDER — SODIUM CHLORIDE 0.9 % (FLUSH) INJECTION SYRINGE
2.00 mL | INJECTION | INTRAMUSCULAR | Status: DC | PRN
Start: 2015-10-14 — End: 2015-10-14

## 2015-10-14 MED ORDER — SODIUM CHLORIDE 0.9 % INTRAVENOUS SOLUTION
INTRAVENOUS | Status: DC
Start: 2015-10-14 — End: 2015-10-14

## 2015-10-14 MED ADMIN — HYDROmorphone (PF) 30 mg/30 mL (1 mg/mL) in 0.9 % NaCl IV PCA syringe: INTRAVENOUS | @ 11:00:00 | NDC 61553060120

## 2015-10-14 SURGICAL SUPPLY — 118 items
BASIN EME 16OZ 9X3.8X2IN GRAD_FLXB DISP DST ROSE POLYPROP (PATU)
BASIN EME 8.4X3.8X2IN GRAD DISP DST ROSE POLYPROP C500ML LF (PATU) IMPLANT
BITE BLOCK ADULT LATEX FREE_000429 CS/50 (AIR) ×1
BITE BLOCK PEDS DISP GIS2M_100/BX (AIR)
BLOCK BITE 20MM PE ADULT MOUTHPC STRAP RETENTION RIM LUM SCPSVR LF  LRG 27MM GRN NONST DISP (AIR) ×1 IMPLANT
BLOCK BITE 27FR INFANT BITEBLOCS PEDIABLOC LF  DISP (AIR) IMPLANT
BRUSH CYTO 230CM 2.4MM INFNT ROT HNDL (INSTRUMENTS)
BRUSH CYTO 230CM 2.4MM INFNT ROT HNDL (SURGICAL INSTRUMENTS) IMPLANT
BRUSH CYTOLOGY COLONSCOPE DISP_2.4MM 00711499 BX/5 (BIOMEDICAL)
CANNULA INJ 17GA NDLS SYRG BLUNT STRL LF  10ML BD INTRLNK PLASTIC BXTR INTLNK ABT LS MCGAW SAFELINE (IV TUBING & ACCESSORIES) IMPLANT
CANNULA NASAL 14FT ANGL FLXB LIP PLATE CRSH RS LUM TUBE NFLR TP ADULT ARLF UCIT STD CURVE LF  DISP (CANNULA) IMPLANT
CANNULA NASAL 14FT ANGL FLXB L_IP PLATE CRSH RS LUM TUBE NFLR (CANNULA)
CANNULA NASAL 7FT ANGL FLXB LIP PLATE CRSH RS LUM TUBE FLR TIP ADULT ARLF UCIT STD CURVE LF  DISP (CANNULA) IMPLANT
CANNULA NASAL 7FT ANGL FLXB LI_P PLATE CRSH RS LUM TUBE FLR (CANNULA)
CANNULA PLASTIC NEEDLELESS IV_303345 100/BX (IV TUBING & ACCESSORIES)
CATH ELHMST GLD PRB 10FR 300CM_BIPO RND DIST TIP STD CONN (DIAGNOSTIC)
CATH ELHMST GLD PROBE 10FR 300CM BIPOLAR RND DIST TIP STD CONN FIRM SHAFT HMGLD STRL DISP 3.7MM MN (DIAGNOSTIC) IMPLANT
CATH ELHMST GLD PROBE 7FR 300CM BIPOLAR RND DIST TIP STD CONN FIRM SHAFT HMGLD STRL DISP 2.8MM MN (DIAGNOSTIC) IMPLANT
CATH ELHMST GLD PROBE 7FR 300C_M BIPOLAR RND DIST TIP STD (DIAGNOSTIC)
CATH SUCT ARLF TRIFLO 18FR 2 3ANG EYE BVL TIP CONN CONTROL PORT STRL LF  DISP CLR (Suction) ×1 IMPLANT
CATH SUCT ARLF TRIFLO 18FR CNT_RL VLV STR D MRKNG STRL LF (Suction) ×1
CLIP HMST MR CONDITIONAL BRD CATH ROT CONTROL KNOB NO SHEATH RSL 360 235CM 2.8MM 11MM OPN (SURGICAL INSTRUMENTS) IMPLANT
CONV USE ITEM 343591 - SOLIDIFY FLUID 1500ML DSPNSR L_Q TX SOLIDIFY SFTP LTS+ DISP (STER) ×1 IMPLANT
DEVICE HEMOSTASIS CLIP 360_235CM RESOLUTION (INSTRUMENTS)
DEVICE INFNT STFR BRSTL CYTO 2.4MM 230CM STRL LF  DISP (BIOMEDICAL) IMPLANT
DILATOR ENDOS CRE 180CM 5.5CM 10-11-12MM 7.5FR ESOPH PYL COLON BAL LOW PROF GW PEBAX STRL LF  DISP (BALLOON) IMPLANT
DILATOR ENDOS CRE 180CM 5.5CM 12-13.5-15MM 7.5FR ESOPH PYL COLON BAL LOW PROF GW PEBAX STRL LF  DISP (BALLOON) IMPLANT
DILATOR ENDOS CRE 180CM 5.5CM 6-7-8MM 7.5FR ESOPH PYL BIL BAL LOW PROF GW PEBAX STRL DISP 2.8MM 3.2 (BALLOON) IMPLANT
DILATOR ENDOS CRE 180CM 5.5CM 7.5FR 8-9-10MM ESPH BIL PYL GW BAL LF  DISP ACPT .035IN GW (BALLOON) IMPLANT
DILATOR ENDOS CRE 180CM 5.5CM_10-11-12MM 7.5FR ESOPH PYL (BALLOON)
DILATOR ENDOS CRE 180CM 5.5CM_12-13.5-15MM 7.5FR ESOPH PYL (BALLOON)
DILATOR ENDOS CRE 180CM 5.5CM_6-7-8MM 7.5FR ESOPH PYL BIL (BALLOON)
DILATOR ENDOS CRE 180CM 5.5CM_7.5FR 8-9-10MM ESPH BIL PYL GW (BALLOON)
DILATOR ENDOS CRE 180CM 8CM 10-11-12MM 6FR ESOPH BAL LOW PROF FIX WRE PEBAX STRL LF  DISP 2.8MM (BALLOON) IMPLANT
DILATOR ENDOS CRE 180CM 8CM 10_-11-12MM 6FR ESOPH BAL LOW (BALLOON)
DILATOR ENDOS CRE 180CM 8CM 15-16.5-18MM 6FR ESOPH BAL LOW PROF FIX WRE PEBAX STRL LF  DISP 2.8MM (BALLOON)
DILATOR ENDOS CRE 180CM 8CM 15_-16.5-18MM 6FR ESOPH BAL LOW (BALLOON)
DILATOR ENDOS CRE 180CM 8CM 6-7-8MM 6FR ESOPH BAL LOW PROF FIX WRE PEBAX STRL LF  DISP 2.8MM (BALLOON) IMPLANT
DILATOR ENDOS CRE 180CM 8CM 6-_7-8MM 6FR ESOPH BAL LOW PROF (BALLOON)
DILATOR ENDOS CRE 180CM 8CM 618-19-20MM 6FR ESOPH PYL FIX WRE BAL PEBAX STRL DISP ACPT .035IN GW 2.8 (BALLOON) IMPLANT
DILATOR ENDOS CRE 180CM 8CM 61_8-19-20MM 6FR ESOPH PYL FIX (BALLOON)
DILATOR ENDOS CRE 180CM 8CM 6FR 12-13.5-15MM ESOPH FIX WRE BAL RND SHLDR PEBAX STRL LF  DISP (GI LAB SUPPLIES) IMPLANT
DILATOR ENDOS CRE 180CM 8CM 6F_R 12-13.5-15MM ESOPH FIX WRE (GI LAB SUPPLIES)
DILATOR ENDOS CRE 180CM 8CM 8-9-10MM 6FR ESOPH BAL LOW PROF FIX WRE PEBAX STRL LF  DISP 2.8MM (GI LAB SUPPLIES) IMPLANT
DILATOR ENDOS CRE 180CM 8CM 8-_9-10MM 6FR ESOPH BAL LOW PROF (GI LAB SUPPLIES)
DILATOR ENDOS CRE 240CM 5.5CM 11-13.5-15MM 7.5FR ESOPH PYL BIL BAL LOW PROF GW PEBAX STRL LF  DISP (BALLOON) IMPLANT
DILATOR ENDOS CRE 240CM 5.5CM 15-16.5-18MM 7.5FR ESOPH PYL BIL BAL LOW PROF GW PEBAX STRL LF  DISP (BALLOON) IMPLANT
DILATOR ENDOS CRE 240CM 5.5CM_11-13.5-15MM 7.5FR ESOPH PYL (BALLOON)
DILATOR ENDOS CRE 240CM 5.5CM_15-16.5-18MM 7.5FR ESOPH PYL (BALLOON)
DISC USE ITEM 309153 - TRAP SPECI ARGYLE 40CC GRAD SC_REW ON CAP REM MALE CONN (Cautery Accessories)
DISCONTINUED NO SUB - JELLY LUB DYNALUBE BCTRST WATER SOL NGRS PKT STRL 5GM LF (WOUND CARE SUPPLY) IMPLANT
DISCONTINUED USE ITEM 309153 - TRAP SPECI ARGYLE 40CC GRAD SC_REW ON CAP REM MALE CONN (Cautery Accessories) IMPLANT
DISCONTINUED USE ITEM 339015 - CONTAINR STRL 10% NEUT BF FRMLN POLYPROP GRAD LEAK RST ORNG PREFL SCREW CAP FSHR HLTHCR PRTCL GRN (CHEM) ×1 IMPLANT
DISCONTINUED USE ITEM 82101 - TUBING OXYGEN 50/CS 001302 (TUBE/TUBING & SUCTION SUPPLIES) IMPLANT
DUPE USE ITEM 301092 - DILATOR ENDOS CRE 180CM 8CM 15-16.5-18MM 6FR ESOPH BAL LOW PROF FIX WRE PEBAX STRL LF  DISP 2.8MM (BALLOON) IMPLANT
ELECTRODE PATIENT RTN 9FT VLAB C30- LB RM PHSV ACRL FOAM CORD NONIRRITATE NONSENSITIZE ADH STRP (CAUTERY SUPPLIES) IMPLANT
ELECTRODE PATIENT RTN 9FT VLAB_REM C30- LB PLHSV ACRL FOAM (CAUTERY SUPPLIES)
FORCEPS BIOPSY 240CM 2.4MM RJ_4 2.8MM LRG CPC DISP ORNG (GUIDING)
FORCEPS BIOPSY MICROMESH TTH STREAMLINE CATH 240CM 2.4MM RJ 4 SS LRG CPC STRL DISP ORNG 2.8MM WRK (GUIDING) IMPLANT
FORCEPS BIOPSY NEEDLE 160CM 1.8MM RJ 4 DISP YW 2MM WRK CHNL GSPED (SURGICAL INSTRUMENTS) IMPLANT
FORCEPS BIOPSY NEEDLE 160CM 1._8MM RJ 4 PED 2+ MM DISP (INSTRUMENTS)
FORCEPS BIOPSY NEEDLE 240CM 2.2MM RJ 4 2.8MM STD CPC STRL DISP ORNG (SURGICAL INSTRUMENTS) ×1 IMPLANT
FORCEPS BIOPSY NEEDLE 240CM 2._2MM RJ 4 2.8MM STD CPC STRL (INSTRUMENTS) ×1
FORCEPS ENDOS 240CM 2.8MM RJ 4 JMB DISP (SURGICAL INSTRUMENTS) IMPLANT
FORCEPS ENDOS 240CM 2.8MM RJ 4_JMB DISP (INSTRUMENTS)
FORCEPS SPEC RETR 240CM 2.3MM 15MM TFLN SS 3 RING HNDL 3 PRONG MONOF GRSP FB STRL LF  DISP (ENDOSCOPIC SUPPLIES) IMPLANT
FORCEPS SPEC RETR 240CM 2.3MM_15MM 3 RING HNDL RAT TOOTH (INSTRUMENTS ENDOMECHANICAL)
FORMALIN 10% BUFF 8ML_23032059 100EA/PK (CHEM) ×1
GOWN SURG XL AAMI L3 NONREINFO_RCE HKLP CLSR STRL LTX PNK SMS (DGOW)
GOWN SURG XL L3 NONREINFORCE HKLP CLSR STRL LTX PNK SMS 47IN (DGOW) IMPLANT
INK ENDOSCOPIC MARKER SPOT 5ML GIS44 STERILE 10EA/BX (MISCELLANEOUS PT CARE ITEMS) IMPLANT
JELLY LUB EZ BCTRST H2O SOL NG_RS PKT STRL 5GM LF (WOUND CARE/ENTEROSTOMAL SUPPLY)
KIT ENDOS ENDOZIME BDSD SLR SPONGE ENZM DETERGENT 500ML (DIS) ×1 IMPLANT
KIT ENDOS ENZM BDSD SLR SPONGE_ENZM DTRG 500ML (DIS) ×1
LIGATOR 122MM 9.5-13MM 6SHTR SA 6 BAND TRGR CORD NONST DISP ENDOS ESOPH VARICES NATURAL RUB LTX (SUTURE STAPLING DEVICES) IMPLANT
LIGATOR 122MM 9.5-13MM 6SHTR S_D 6 BAND STRL DISP ENDOS 2.8MM (SUTURE STAPLING DEVICES)
LIGATOR 2300MM 30MM PLLP PRELD_DEV INT HNDL DISP ENDOS (INSTRUMENTS ENDOMECHANICAL)
LIGATOR DEV STRL DISP ENDOS LF (ENDOSCOPIC SUPPLIES) IMPLANT
LINER SUCT RD CRD MEDIVAC TW LOCK LID SHTOF VALVE CAN FILTER 1500CC LF  DISP (Suction) ×1 IMPLANT
LINER SUCT RD CRD MEDIVAC TW L_OCK LID SHTOF VALVE CAN FLTR (Suction) ×1
LOOP AUTOCLAV HLDR DTCH 30MM S_URG NYL PLPCTM NONST DISP (INSTRUMENTS ENDOMECHANICAL)
LOOP HLDR DTCH AUTOCLAV 30MM SURG NYL PLPCTM NONST LF  DISP (ENDOSCOPIC SUPPLIES) IMPLANT
NEEDLE ENDOS 5MM 25GA 2.5MM SS TFLN 230CM INJ PRJ SHEATH LL SPRG LD HNDL CRLK STRL LF  DISP (NEEDLES & SYRINGE SUPPLIES) IMPLANT
NEEDLE ENDOS 5MM 25GA 2.5MM SS_TFLN 230CM INJ PRJ SHTH LL (NEEDLES & SYRINGE SUPPLIES)
NEEDLE SCLRTX 25GA 2.3MM OPTC TIP SHTH STRL LF DISP YW (NEEDLES & SYRINGE SUPPLIES) IMPLANT
NET SPEC RETR 160CM 3MM RTHNT MAXI SHEATH 8X4CM NONST LF  DISP (Dilators) IMPLANT
NET SPEC RETR 160MM 1.8MM RTHN_T MINI 4.5X2CM PED NONST LF (Dilators)
NET SPEC RETR 230CM 2.5MM RTHNT STD SHEATH 6X3CM NONST LF  DISP (Dilators) IMPLANT
NET SPEC RETR 230CM 2.5MM RTHN_T STD SHTH 6X3CM NONST LF (Dilators)
NET SPEC RETR 8X4CM 3MM RTHNT_MAXI 160CM NONST LF DISP (Dilators)
OVERTUBE ENDOS 25CM 19.5MM 8.6-10MM 16.7MM INSFL CAP GUARDUS STD TAPER ESPH NONST LF  DISP (AIR) IMPLANT
OVERTUBE ENDOS 25CM 19.5MM 8.6_-10MM 16.7MM INSFL CAP GUARDUS (AIR)
OVERTUBE ENDOS 50CM 19.5MM 8.6-10MM 16.7MM TAPER TIP INSFL CAP GUARDUS GASTRIC LF (ENDOSCOPIC SUPPLIES) IMPLANT
OVERTUBE ENDOS 50CM 19.5MM 8.6_-10MM 16.7MM INSFL CAP PROTECT (INSTRUMENTS ENDOMECHANICAL)
PROBE COAG 7.2FT 6.9FR FIAPC CRCMF PLUG PLAY FUNCTIONALITY FILTER (ENDOSCOPIC SUPPLIES) IMPLANT
PROBE ESURG 220CM 2.3MM FIAPC FLXB STR FIRE STRL DISP (CAUTERY SUPPLIES) IMPLANT
PROBE ESURG 220CM 2.3MM FIAPC_FLXB STR FIRE ARGON PLAS COAG (CAUTERY SUPPLIES)
PROBE ESURG 7.2FT 6.9FR FIAPC_CRCMF PLUG PLAY FUNCTIONALITY (INSTRUMENTS ENDOMECHANICAL)
RETRIEVER ENDOS 160CM 1.8MM RTHNT MINI SM CATH SHEATH 4.5X2CM NONST (Dilators) IMPLANT
SET EXT STR 6.2ML 42IN IV MALE LL ADPR STD BORE 2 LL ACT VALVE RETRACT COL STRL CLRLNK LF  DEHP (IV TUBING & ACCESSORIES) IMPLANT
SET EXT STR 6.2ML 42IN IV MALE_LL ADPR STD BORE 2 LL ACT (IV TUBING & ACCESSORIES)
SNARE SM OVAL 240CM 2.4MM SNS LOOP SHRTHRW FLXB ENDOS PLPCTM 13MM STRL LF  DISP (DIAGNOSTIC) IMPLANT
SNARE SM OVL 240CM 2.4MM SNS L_OOP SHRTHRW FLXB ENDOS PLPCTM (DIAGNOSTIC)
SOLIDIFY FLUID 1500ML DSPNSR L_Q TX SOLIDIFY SFTP LTS+ DISP (STER) ×2
SOLUTION IRRG H2O 500CC 2F7113 (SOLUTIONS) ×1
SYRINGE INFLAT ALN II GA STRL DISP 60ML (NEEDLES & SYRINGE SUPPLIES) IMPLANT
SYRINGE INFLAT ALN II GA STRL_DISP 60ML (NEEDLES & SYRINGE SUPPLIES)
SYRINGE LL 50ML LF  STRL GRAD N-PYRG DEHP-FR PVC FREE MED DISP CLR (NEEDLES & SYRINGE SUPPLIES) IMPLANT
SYRINGE LL 50ML LF STRL GRAD_N-PYRG DEHP-FR PVC FREE MED (NEEDLES & SYRINGE SUPPLIES)
TRAP SPEC RETR ETRAP MAGNIFY W IND MSR GUIDE PLYP LF DISP (GI LAB SUPPLIES) IMPLANT
TRAY GASTRIC LAV 36IN 24FR ARGYLE EDLICH MONOJECT MED PVC 4 EYE CLS END GRAD SYRG TRNSPR 140CC PED (TRAY) IMPLANT
TRAY GASTRIC LAV 36IN 24FR ARG_YLE EDLICH MONOJECT MED PVC 4 (TRAY)
TRAY GASTRIC LAV 36IN 34FR ARGYLE EDLICH MONOJECT LRG PVC 4 EYE CLS END GRAD SYRG TRNSPR 140CC NONST (TRAY) IMPLANT
TRAY GASTRIC LAV 36IN 34FR ARG_YLE EDLICH MONOJECT LRG PVC 4 (TRAY)
TUBING OXYGEN 50/CS 001302 (TUBE/TUBING & SUCTION SUPPLIES)
TUBING SUCT CLR 20FT 9/32IN MEDIVAC NCDTV M/M CONN STRL LF (Suction) ×1 IMPLANT
TUBING SUCT CONN 20FT LONG_STRL N720A (Suction) ×1
WATER STRL 500ML PLASTIC PR BTL LF (SOLUTIONS) ×1 IMPLANT

## 2015-10-14 NOTE — Care Plan (Signed)

## 2015-10-14 NOTE — Care Plan (Signed)
Problem: Anxiety (Adult)  Goal: Identify Related Risk Factors and Signs and Symptoms  Related risk factors and signs and symptoms are identified upon initiation of Human Response Clinical Practice Guideline (CPG)   Outcome: Ongoing (see interventions/notes)    Problem: Thought Process Alteration (Adult)  Goal: Identify Related Risk Factors and Signs and Symptoms  Related risk factors and signs and symptoms are identified upon initiation of Human Response Clinical Practice Guideline (CPG)   Outcome: Ongoing (see interventions/notes)    Problem: Perioperative Period (Adult)  Prevent and manage potential problems including:1. bleeding2. gastrointestinal complications3. hypothermia4. infection5. pain6. perioperative injury7. respiratory compromise8. situational response9. urinary retention10. venous thromboembolism11. wound complications   Goal: Signs and Symptoms of Listed Potential Problems Will be Absent or Manageable (Perioperative Period)  Signs and symptoms of listed potential problems will be absent or manageable by discharge/transition of care (reference Perioperative Period (Adult) CPG).   Outcome: Ongoing (see interventions/notes)

## 2015-10-14 NOTE — Anesthesia Postprocedure Evaluation (Signed)
ANESTHESIA POSTOP EVALUATION NOTE         10/14/2015     Last Vitals: Temperature: 36 C (96.8 F) (10/14/15 1110)  Heart Rate: 80 (10/14/15 1110)  BP (Non-Invasive): (!) 122/96 (10/14/15 1110)  Respiratory Rate: 15 (10/14/15 1110)  SpO2-1: 99 % (10/14/15 1110)  Pain Score (Numeric, Faces): 8 (10/14/15 1002)    Procedures:   GASTROSCOPY (N/A Mouth)  GASTROSCOPY WITH BIOPSY (N/A Mouth)    Patient is sufficiently recovered from the effects of anesthesia to participate in the evaluation and has returned to their pre-procedure level.  I have reviewed and evaluated the following:  Respiratory Function: Consistent with pre anesthetic level  Cardiovascular Function: Consistent with pre anesthetic level  Mental Status: Return to pre anesthetic baseline level  Pain: Sufficiently controlled with medication  Nausea and Vomiting: Absent or sufficiently controlled with medication  Post-op Anesthetic Complications: None    Comment/ re-evaluation for any variations: None

## 2015-10-14 NOTE — Anesthesia Preprocedure Evaluation (Signed)
Physical Exam:     Airway           Mouth Opening: good.  No Facial hair  No Beard  No endotracheal tube present  No Tracheostomy present    Dental       Dentition intact             Pulmonary    Breath sounds clear to auscultation  (-) no rhonchi, no decreased breath sounds, no wheezes, no rales and no stridor     Cardiovascular    Rhythm: regular  Rate: Normal  (-) no friction rub, carotid bruit is not present, no peripheral edema and no murmur     Other findings            Anesthesia Plan:  Planned anesthesia type: general and MAC  ASA 3     Intravenous induction   Anesthetic plan and risks discussed with patient.     Anesthesia issues/risks discussed are: Dental Injuries, Post-op Intubation/Ventilation, Stroke, Difficult Airway, Blood Loss, Cardiac Events/MI, PONV, Aspiration and Intraoperative Awareness/ Recall.    Use of blood products discussed with patient whom consented to blood products.     Patient's NPO status is appropriate for Anesthesia.         Plan discussed with CRNA and resident.

## 2015-10-14 NOTE — Nurses Notes (Signed)
After visit summary (AVS) and education hand-outs were given to patient and sposue after instructional information was reviewed and understood. Discharged via wheelchair by MA

## 2015-10-14 NOTE — H&P (Signed)
[facility]  GI Admission History and Physical      Michele Mason, Michele Mason   MRN:  [id number]  Date of Birth:  08-20-1975    Date of Procedure:  10/14/2015    Chief Complaint: Abdominal pain and Abnormal imaging of GI tract    HPI: Michele Mason, Michele Mason is a 40 y.o. year old female who presents today for EGD.    This procedure is being done to evaluate Abdominal pain and Abnormal imaging of GI tract.    The patient denies melena.    Past Medical History:   Diagnosis Date    Anemia     Anemia, chronic disease 07/23/2012    Anxiety 07/23/2012    Asthma     does not use inhalers    Back problem     Bloody diarrhea 01/11/2014    Bruises easily     Cancer (HCC)     gastric cancer    Chronic pain disorder 07/10/2012    Clot     Contact dermatitis and other eczema, due to unspecified cause     rash in the chest that is resolving    Cough     Crohn disease ([place]) 07/23/2012    Pt was treated for Crohn's for 10 years, has not had any relapses and no steroids for more than a decade.    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    GERD (gastroesophageal reflux disease)     Heart murmur     states 40 years of age    History of transfusion     no reaction    Hypertension     ITP (idiopathic thrombocytopenic purpura)     Kidney disease     cyst on kidney    Migraine     Moderate protein-calorie malnutrition ([place]) 07/10/2012    Neck problem     pain    Nutritional disorder     Palpitations     Peripheral neuropathy (HCC)     Personal history of PE (pulmonary embolism) 07/23/2012    Pulmonary embolism ([place]) 2001    Sarcoidosis ([place]) 07/10/2012    Shortness  of breath     walking up steps and has shortness of breath , walking 20 feet on the flat    Wears glasses     Weight loss 01/11/2014    Weight loss, abnormal            Allergies   Allergen Reactions    Adhesive Rash and Itching       Medications Prior to Admission     Prescriptions    ATENOLOL ORAL    Take 50 mg by mouth Once a day     diltiaZEM (CARTIA XT) 240 mg Oral Capsule, Sust. Release 24 hr    Take 240 mg by mouth Once a day    hydroCHLOROthiazide (MICROZIDE) 12.5 mg Oral Capsule    Take 25 mg by mouth Once a day    lisinopril (PRINIVIL) 20 mg  Oral Tablet    Take 40 mg by mouth Once a day     POLYETHYLENE GLYCOL 3350 (MIRALAX ORAL)    Take by mouth    PROMETHAZINE HCL (PHENERGAN ORAL)    Take 25 mg by mouth Every 4 hours as needed            Past Surgical History:   Procedure Laterality Date    ENDOSCOPIC ULTRASOUND UPPER  07/11/2012    ENDOSCOPIC U/S UPPER performed by Jocelyn Lamer, MD at [place]  07/11/2012    GASTROSCOPY WITH BIOPSY performed by Jocelyn Lamer, MD at [place]      HX FACIAL RECONSTRUCTION      HX OVARIAN CYST REMOVAL      HX SMALL BOWEL RESECTION      twice    HX SUBCLAVIAN PORT IMPLANTION Right     PB BRONCHOSCOPY,DIAGNOSTIC N/A 10/20/2012    BRONCHOSCOPY FLEXIBLE ADULT performed by Christ Kick, MD at [place]  10/20/2012    ULTRASOUND ENDOBRONCHIAL performed by Christ Kick, MD at [facility]           Physical Exam:  HEENT: Airway patent, Trachea midline, No large goiters or neck lymphadenopathy  Heart: Regular rate and rhythm, no murmurs  Lungs: Clear to auscultation bilaterally without decreased breath sounds, rhonchi, or wheezes  Abdomen: Soft, nondistended, positive bowel sounds, nontender       Assessment:  Abdominal pain and Abnormal imaging of GI tract    Plan:  Proceed with EGD.     Orders Placed This Encounter    POCT URINE HCG    REMOVE SALINE LOCK    INSERT &  MAINTAIN PERIPHERAL IV ACCESS    INSERT & MAINTAIN PERIPHERAL IV ACCESS    REMOVE SALINE LOCK    AND Linked Order Group     NS flush syringe     NS flush syringe    LR premix infusion    NS premix infusion         Elise Benne, MD

## 2015-10-14 NOTE — OR Nursing (Signed)
C/o abdominal pain "8" on 0-10 scale. Will monitor.

## 2015-10-14 NOTE — Anesthesia Transfer of Care (Signed)
ANESTHESIA TRANSFER OF CARE NOTE                Last Vitals: Temperature: 36 C (96.8 F) (10/14/15 1110)  Heart Rate: 80 (10/14/15 1110)  BP (Non-Invasive): (!) 122/96 (10/14/15 1110)  Respiratory Rate: 15 (10/14/15 1110)  SpO2-1: 99 % (10/14/15 1110)  Pain Score (Numeric, Faces): 8 (10/14/15 1002)    Patient transferred to PACU in stable condition. Report given to RN.    5/2/2017at 11:10.

## 2015-10-14 NOTE — Discharge Instructions (Signed)
Instructions for Upper Endoscopy:  Patient Information:  No driving until tomorrow, You may experience a sore throat and gas or bloating for 24 hours, Use an antacid for mild discomfort and Call in one week for biopsy results- GI Office [phone].  REMEMBER TO:  Call your physician for any of the following symptoms:  1.) Persistent vomiting or vomiting bright red blood  2.) Fever (temperature greater than 100F)  3.) Any difficulty breathing.  SURGICAL DISCHARGE INSTRUCTIONS     Dr. Truman Hayward, Oliva Bustard, MD  performed your GASTROSCOPY, GASTROSCOPY WITH BIOPSY today at the [facility]:  Monday through Friday from 6 a.m. - 7 p.m.: [phone]  Between 7 p.m. - 6 a.m., weekends and holidays:  Call [facility] at [phone] or [phone].    PLEASE SEE WRITTEN HANDOUTS AS DISCUSSED BY YOUR NURSE:      SIGNS AND SYMPTOMS OF A WOUND / INCISION INFECTION   Be sure to watch for the following:   Increase in redness or red streaks near or around the wound or incision.   Increase in pain that is intense or severe and cannot be relieved by the pain medication that your doctor has given you.   Increase in swelling that cannot be relieved by elevation of a body part, or by applying ice, if permitted.   Increase in drainage, or if yellow / green in color and smells bad. This could be on a dressing or a cast.   Increase in fever for longer than 24 hours, or an increase that is higher than 101 degrees Fahrenheit (normal body temperature is 98 degrees Fahrenheit). The incision may feel warm to the touch.    **CALL YOUR DOCTOR IF ONE OR MORE OF THESE SIGNS / SYMPTOMS SHOULD OCCUR.    ANESTHESIA INFORMATION   ANESTHESIA -- ADULT PATIENTS:  You have received intravenous sedation / general anesthesia, and you may feel drowsy and light-headed for several hours. You may even experience some forgetfulness of the procedure. DO NOT DRIVE A MOTOR VEHICLE or perform any activity requiring  complete alertness or coordination until you feel fully awake in about 24-48 hours. Do not drink alcoholic beverages for at least 24 hours. Do not stay alone, you must have a responsible adult available to be with you. You may also experience a dry mouth or nausea for 24 hours. This is a normal side effect and will disappear as the effects of the medication wear off.    REMEMBER   If you experience any difficulty breathing, chest pain, bleeding that you feel is excessive, persistent nausea or vomiting or for any other concerns:  Call your physician Dr. Truman Hayward at [phone] or [phone]. You may also ask to have the  doctor on call paged. They are available to you 24 hours a day.    SPECIAL INSTRUCTIONS / COMMENTS       FOLLOW-UP APPOINTMENTS   Please call patient services at [phone] or [phone] to schedule a date / time of return. They are open Monday - Friday from 7:30 am - 5:00 pm.

## 2015-10-15 LAB — HISTORICAL SURGICAL PATHOLOGY SPECIMEN

## 2015-10-16 ENCOUNTER — Encounter (INDEPENDENT_AMBULATORY_CARE_PROVIDER_SITE_OTHER): Payer: Self-pay | Admitting: Gastroenterology

## 2015-10-28 ENCOUNTER — Telehealth (INDEPENDENT_AMBULATORY_CARE_PROVIDER_SITE_OTHER): Payer: Self-pay | Admitting: Gastroenterology

## 2015-10-28 NOTE — Telephone Encounter (Signed)
PA form and clinicals faxed to plan for authorizaion of Nexium.  [place], LPN  [id number], [id number]

## 2015-10-28 NOTE — Telephone Encounter (Signed)
Nexium approved until 05.16.2018.  [place] and Ms Gonnering verbally notified via telephone.  [place], LPN  [id number], [phone]

## 2016-01-02 ENCOUNTER — Encounter (HOSPITAL_BASED_OUTPATIENT_CLINIC_OR_DEPARTMENT_OTHER): Payer: Self-pay

## 2016-01-02 NOTE — Progress Notes (Signed)
Assistance through the comfort fund with nutritional supplements upon recommendation of dietitian. Patient insurance does not cover this.

## 2016-01-09 ENCOUNTER — Ambulatory Visit (HOSPITAL_BASED_OUTPATIENT_CLINIC_OR_DEPARTMENT_OTHER): Payer: Medicaid Other

## 2016-02-06 ENCOUNTER — Encounter (HOSPITAL_BASED_OUTPATIENT_CLINIC_OR_DEPARTMENT_OTHER): Payer: Self-pay

## 2016-10-29 ENCOUNTER — Other Ambulatory Visit (INDEPENDENT_AMBULATORY_CARE_PROVIDER_SITE_OTHER): Payer: Self-pay | Admitting: Gastroenterology

## 2016-11-01 NOTE — Telephone Encounter (Signed)
Pt had scope by Dr. Truman Hayward a year ago, and has never been seen in [facility]. Pt needs to request refill from PCP.

## 2016-11-17 ENCOUNTER — Other Ambulatory Visit (INDEPENDENT_AMBULATORY_CARE_PROVIDER_SITE_OTHER): Payer: Self-pay | Admitting: Gastroenterology

## 2020-02-25 ENCOUNTER — Encounter (HOSPITAL_BASED_OUTPATIENT_CLINIC_OR_DEPARTMENT_OTHER): Payer: Self-pay | Admitting: *Deleted

## 2020-02-25 ENCOUNTER — Other Ambulatory Visit: Payer: Self-pay

## 2020-02-25 DIAGNOSIS — I10 Essential (primary) hypertension: Secondary | ICD-10-CM | POA: Diagnosis not present

## 2020-02-25 DIAGNOSIS — C801 Malignant (primary) neoplasm, unspecified: Secondary | ICD-10-CM | POA: Insufficient documentation

## 2020-02-25 DIAGNOSIS — Z79899 Other long term (current) drug therapy: Secondary | ICD-10-CM | POA: Insufficient documentation

## 2020-02-25 DIAGNOSIS — N939 Abnormal uterine and vaginal bleeding, unspecified: Secondary | ICD-10-CM | POA: Diagnosis present

## 2020-02-25 LAB — CBC WITH DIFFERENTIAL/PLATELET
Abs Immature Granulocytes: 0.02 10*3/uL (ref 0.00–0.07)
Basophils Absolute: 0.1 10*3/uL (ref 0.0–0.1)
Basophils Relative: 1 %
Eosinophils Absolute: 0.2 10*3/uL (ref 0.0–0.5)
Eosinophils Relative: 2 %
HCT: 37.7 % (ref 36.0–46.0)
Hemoglobin: 12.2 g/dL (ref 12.0–15.0)
Immature Granulocytes: 0 %
Lymphocytes Relative: 23 %
Lymphs Abs: 2.2 10*3/uL (ref 0.7–4.0)
MCH: 32.3 pg (ref 26.0–34.0)
MCHC: 32.4 g/dL (ref 30.0–36.0)
MCV: 99.7 fL (ref 80.0–100.0)
Monocytes Absolute: 1.1 10*3/uL — ABNORMAL HIGH (ref 0.1–1.0)
Monocytes Relative: 11 %
Neutro Abs: 5.9 10*3/uL (ref 1.7–7.7)
Neutrophils Relative %: 63 %
Platelets: 235 10*3/uL (ref 150–400)
RBC: 3.78 MIL/uL — ABNORMAL LOW (ref 3.87–5.11)
RDW: 12.5 % (ref 11.5–15.5)
WBC: 9.4 10*3/uL (ref 4.0–10.5)
nRBC: 0 % (ref 0.0–0.2)

## 2020-02-25 NOTE — ED Triage Notes (Signed)
Pt c/o vaginal bleeding with clots  x 14 days

## 2020-02-26 ENCOUNTER — Emergency Department (HOSPITAL_BASED_OUTPATIENT_CLINIC_OR_DEPARTMENT_OTHER): Payer: Medicaid - Out of State

## 2020-02-26 ENCOUNTER — Encounter (HOSPITAL_BASED_OUTPATIENT_CLINIC_OR_DEPARTMENT_OTHER): Payer: Self-pay | Admitting: Radiology

## 2020-02-26 ENCOUNTER — Emergency Department (HOSPITAL_BASED_OUTPATIENT_CLINIC_OR_DEPARTMENT_OTHER)
Admission: EM | Admit: 2020-02-26 | Discharge: 2020-02-26 | Disposition: A | Discharge status: Home / Self Care | Payer: Medicaid - Out of State | Attending: Emergency Medicine | Admitting: Emergency Medicine

## 2020-02-26 DIAGNOSIS — N939 Abnormal uterine and vaginal bleeding, unspecified: Secondary | ICD-10-CM

## 2020-02-26 LAB — URINALYSIS, ROUTINE W REFLEX MICROSCOPIC
Bilirubin Urine: NEGATIVE
Glucose, UA: NEGATIVE mg/dL
Hgb urine dipstick: NEGATIVE
Ketones, ur: NEGATIVE mg/dL
Leukocytes,Ua: NEGATIVE
Nitrite: NEGATIVE
Protein, ur: NEGATIVE mg/dL
Specific Gravity, Urine: 1.02 (ref 1.005–1.030)
pH: 6 (ref 5.0–8.0)

## 2020-02-26 LAB — COMPREHENSIVE METABOLIC PANEL
ALT: 10 U/L (ref 0–44)
AST: 22 U/L (ref 15–41)
Albumin: 4.3 g/dL (ref 3.5–5.0)
Alkaline Phosphatase: 58 U/L (ref 38–126)
Anion gap: 8 (ref 5–15)
BUN: 23 mg/dL — ABNORMAL HIGH (ref 6–20)
CO2: 26 mmol/L (ref 22–32)
Calcium: 9.6 mg/dL (ref 8.9–10.3)
Chloride: 105 mmol/L (ref 98–111)
Creatinine, Ser: 0.98 mg/dL (ref 0.44–1.00)
GFR calc Af Amer: >60 mL/min (ref >60)
GFR calc non Af Amer: >60 mL/min (ref >60)
Glucose, Bld: 85 mg/dL (ref 70–99)
Potassium: 4.6 mmol/L (ref 3.5–5.1)
Sodium: 139 mmol/L (ref 135–145)
Total Bilirubin: 0.5 mg/dL (ref 0.3–1.2)
Total Protein: 7.5 g/dL (ref 6.5–8.1)

## 2020-02-26 LAB — PREGNANCY, URINE: Preg Test, Ur: NEGATIVE

## 2020-02-26 MED ORDER — SODIUM CHLORIDE 0.9 % IV BOLUS
1000.0000 mL | Freq: Once | INTRAVENOUS | Status: AC
  Administered 2020-02-26: 1000 mL via INTRAVENOUS

## 2020-02-26 MED ORDER — ONDANSETRON 4 MG PO TBDP: 4.0000 mg | ORAL_TABLET | Freq: Three times a day (TID) | ORAL | 0 refills | Status: AC | PRN

## 2020-02-26 MED ORDER — ONDANSETRON HCL 4 MG/2ML IJ SOLN
4.0000 mg | Freq: Once | INTRAMUSCULAR | Status: AC
  Administered 2020-02-26: 4 mg via INTRAVENOUS
  Filled 2020-02-26: qty 2

## 2020-02-26 MED ORDER — FENTANYL CITRATE (PF) 100 MCG/2ML IJ SOLN
50.0000 ug | Freq: Once | INTRAMUSCULAR | Status: AC
  Administered 2020-02-26: 50 ug via INTRAVENOUS
  Filled 2020-02-26: qty 2

## 2020-02-26 MED ORDER — IOHEXOL 300 MG/ML  SOLN
100.0000 mL | Freq: Once | INTRAMUSCULAR | Status: AC | PRN
  Administered 2020-02-26: 100 mL via INTRAVENOUS

## 2020-02-26 MED ORDER — IOHEXOL 9 MG/ML PO SOLN
500.0000 mL | Freq: Two times a day (BID) | ORAL | Status: DC | PRN
  Administered 2020-02-26: 500 mL via ORAL

## 2020-02-26 MED ORDER — MEGESTROL ACETATE 40 MG PO TABS: 80.0000 mg | ORAL_TABLET | Freq: Every day | ORAL | 0 refills | Status: AC

## 2020-02-26 NOTE — ED Notes (Signed)
Patient transported to CT 

## 2020-02-26 NOTE — ED Provider Notes (Signed)
[facility] EMERGENCY DEPARTMENT Provider Note  CSN: [id number] Arrival date & time: 02/25/20 2325  Chief Complaint(s) Vaginal Bleeding  HPI Kristina Graves is a 44 y.o. female with a past medical history listed below who presents to the emergency department with 14 days of heavy and abnormal uterine bleeding.  Patient reports that prior to this her last menstrual cycle was August 10.  She reports being in nonsexually active for over a year.  Denies any pelvic pain but does endorse lower back cramping.  She endorses some nausea without emesis.  No fevers or chills.  No coughing or congestion.  No urinary symptoms.  No other physical complaints.  HPI  Past Medical History Past Medical History:  Diagnosis Date  . Cancer ([place])    stomach   . Crohn disease ([place])   . Hypertension   . ITP (idiopathic thrombocytopenic purpura)   . Sarcoidosis    There are no problems to display for this patient.  Home Medication(s) Prior to Admission medications   Medication Sig Start Date End Date Taking? Authorizing Provider  lisinopril (PRINIVIL,ZESTRIL) 40 MG tablet Take 40 mg by mouth daily.   Yes [provider]  acetaminophen (TYLENOL) 325 MG tablet Take 650 mg by mouth every 6 (six) hours as needed for moderate pain.    [provider]  atenolol (TENORMIN) 50 MG tablet Take 50 mg by mouth daily.    [provider]  ciprofloxacin (CIPRO) 500 MG tablet Take 1 tablet (500 mg total) by mouth 2 (two) times daily. 02/21/15   Palumbo, April, MD  dicyclomine (BENTYL) 20 MG tablet Take 1 tablet (20 mg total) by mouth 2 (two) times daily. 02/21/15   Harris, Abigail, PA-C  diltiazem (CARTIA XT) 240 MG 24 hr capsule Take 240 mg by mouth daily.    [provider]  ferrous fumarate (HEMOCYTE - 106 MG FE) 325 (106 FE) MG TABS tablet Take 1 tablet by mouth daily.    [provider]  hydrochlorothiazide (HYDRODIURIL) 25 MG tablet Take 25 mg by mouth daily.     [provider]  ibuprofen (ADVIL,MOTRIN) 200 MG tablet Take 800 mg by mouth every 6 (six) hours as needed for moderate pain.    [provider]  megestrol (MEGACE) 40 MG tablet Take 2 tablets (80 mg total) by mouth daily. 02/26/20 03/27/20  Fatima Blank, MD  metroNIDAZOLE (FLAGYL) 500 MG tablet Take 1 tablet (500 mg total) by mouth 3 (three) times daily. One po bid x 7 days 02/21/15   Palumbo, April, MD  oxyCODONE (ROXICODONE) 15 MG immediate release tablet Take 15 mg by mouth every 4 (four) hours as needed for pain.    [provider]  Pancrelipase, Lip-Prot-Amyl, (CREON) 24000 UNITS CPEP Take 2 capsules by mouth 3 (three) times daily.    [provider]  pantoprazole (PROTONIX) 40 MG tablet Take 40 mg by mouth daily.    [provider]  promethazine (PHENERGAN) 25 MG tablet Take 1 tablet (25 mg total) by mouth every 6 (six) hours as needed for nausea or vomiting. 02/21/15   Margarita Mail, PA-C  traMADol (ULTRAM) 50 MG tablet Take 1 tablet (50 mg total) by mouth every 6 (six) hours as needed. 02/21/15   Margarita Mail, PA-C  Past Surgical History Past Surgical History:  Procedure Laterality Date  . colon reconstruction    . OOPHORECTOMY     Family History No family history on file.  Social History Social History   Tobacco Use  . Smoking status: Current Every Day Smoker    Types: Cigarettes  Substance Use Topics  . Alcohol use: No  . Drug use: No   Allergies Patient has no known allergies.  Review of Systems Review of Systems All other systems are reviewed and are negative for acute change except as noted in the HPI  Physical Exam Vital Signs  I have reviewed the triage vital signs BP 119/83   Pulse 86   Temp 98.3 F (36.8 C) (Oral)   Resp 16   Ht 5' 2"  (1.575 m)   Wt 60.3 kg   SpO2 100%    BMI 24.33 kg/m   Physical Exam Vitals reviewed. Exam conducted with a chaperone present.  Constitutional:      General: She is not in acute distress.    Appearance: She is well-developed. She is not diaphoretic.  HENT:     Head: Normocephalic and atraumatic.     Right Ear: External ear normal.     Left Ear: External ear normal.     Nose: Nose normal.  Eyes:     General: No scleral icterus.    Conjunctiva/sclera: Conjunctivae normal.  Neck:     Trachea: Phonation normal.  Cardiovascular:     Rate and Rhythm: Normal rate and regular rhythm.  Pulmonary:     Effort: Pulmonary effort is normal. No respiratory distress.     Breath sounds: No stridor.  Abdominal:     General: There is no distension.     Tenderness: There is no abdominal tenderness. There is no guarding or rebound.  Genitourinary:    Vagina: No vaginal discharge, tenderness or bleeding.     Cervix: Cervical bleeding present. No cervical motion tenderness or erythema.     Uterus: Not tender.      Adnexa:        Right: No tenderness.         Left: No tenderness.    Musculoskeletal:        General: Normal range of motion.     Cervical back: Normal range of motion.  Neurological:     Mental Status: She is alert and oriented to person, place, and time.  Psychiatric:        Behavior: Behavior normal.     ED Results and Treatments Labs (all labs ordered are listed, but only abnormal results are displayed) Labs Reviewed  CBC WITH DIFFERENTIAL/PLATELET - Abnormal; Notable for the following components:      Result Value   RBC 3.78 (*)    Monocytes Absolute 1.1 (*)    All other components within normal limits  COMPREHENSIVE METABOLIC PANEL - Abnormal; Notable for the following components:   BUN 23 (*)    All other components within normal limits  URINALYSIS, ROUTINE W REFLEX MICROSCOPIC  PREGNANCY, URINE  EKG  EKG Interpretation  Date/Time:    Ventricular Rate:    PR Interval:    QRS Duration:   QT Interval:    QTC Calculation:   R Axis:     Text Interpretation:        Radiology CT ABDOMEN PELVIS W CONTRAST  Result Date: 02/26/2020 CLINICAL DATA:  Vaginal bleeding.  Abdominal pain EXAM: CT ABDOMEN AND PELVIS WITH CONTRAST TECHNIQUE: Multidetector CT imaging of the abdomen and pelvis was performed using the standard protocol following bolus administration of intravenous contrast. CONTRAST:  174m OMNIPAQUE IOHEXOL 300 MG/ML  SOLN COMPARISON:  Five days ago FINDINGS: Lower chest:  No contributory findings. Hepatobiliary: No focal liver abnormality.No evidence of biliary obstruction or stone. Pancreas: Generalized atrophy. Spleen: Unremarkable. Adrenals/Urinary Tract: Negative adrenals. No hydronephrosis or stone. Left upper pole renal cortical scarring. Small left renal cortical cysts. Unremarkable bladder. Stomach/Bowel: Distal gastrectomy. Diffuse colonic stool. No pericecal inflammation seen. Vascular/Lymphatic: No acute vascular abnormality. No mass or adenopathy. Reproductive:No pathologic findings. Left ovarian cystic densities which are follicles based on development from prior. A tampon is present. Other: No ascites or pneumoperitoneum. Musculoskeletal: No acute abnormalities. Chronic bilateral L5 pars defects. IMPRESSION: 1. No acute finding. 2. Diffuse colonic stool, please correlate for constipation. 3. Distal gastrectomy.  Negative for bowel obstruction. Electronically Signed   By: JMonte FantasiaM.D.   On: 02/26/2020 06:16    Pertinent labs & imaging results that were available during my care of the patient were reviewed by me and considered in my medical decision making (see chart for details).  Medications Ordered in ED Medications  iohexol (OMNIPAQUE) 9 MG/ML oral solution 500 mL (500 mLs Oral Contrast Given 02/26/20 0349)  fentaNYL (SUBLIMAZE) injection 50 mcg (50  mcg Intravenous Given 02/26/20 0405)  ondansetron (ZOFRAN) injection 4 mg (4 mg Intravenous Given 02/26/20 0404)  sodium chloride 0.9 % bolus 1,000 mL (0 mLs Intravenous Stopped 02/26/20 0518)  iohexol (OMNIPAQUE) 300 MG/ML solution 100 mL (100 mLs Intravenous Contrast Given 02/26/20 0533)                                                                                                                                    Procedures Procedures  (including critical care time)  Medical Decision Making / ED Course I have reviewed the nursing notes for this encounter and the patient's prior records (if available in EHR or on provided paperwork).   MKIYLEE THORESONwas evaluated in Emergency Department on 02/26/2020 for the symptoms described in the history of present illness. She was evaluated in the context of the global COVID-19 pandemic, which necessitated consideration that the patient might be at risk for infection with the SARS-CoV-2 virus that causes COVID-19. Institutional protocols and algorithms that pertain to the evaluation of patients at risk for COVID-19 are in a state of rapid change based on information released by regulatory bodies including the CDC and federal and  state organizations. These policies and algorithms were followed during the patient's care in the ED.  Patient presents with abnormal uterine bleeding and lower back cramping.  Abdomen benign.  Labs grossly reassuring without leukocytosis or anemia.  She does have a history of ITP but platelets are within normal limits.  UA without evidence of infection.  UPT negative.  CT scan obtained given her history of cancer and grossly reassuring.  Pelvic exam without evidence of cervical mass.  Blood is coming from the cervical os.  Patient provided with prescription for Megace and recommended close gynecology follow-up.      Final Clinical Impression(s) / ED Diagnoses Final diagnoses:  Abnormal uterine bleeding (AUB)    The patient  appears reasonably screened and/or stabilized for discharge and I doubt any other medical condition or other [facility] requiring further screening, evaluation, or treatment in the ED at this time prior to discharge. Safe for discharge with strict return precautions.  Disposition: Discharge  Condition: Good  I have discussed the results, Dx and Tx plan with the patient/family who expressed understanding and agree(s) with the plan. Discharge instructions discussed at length. The patient/family was given strict return precautions who verbalized understanding of the instructions. No further questions at time of discharge.    ED Discharge Orders         Ordered    megestrol (MEGACE) 40 MG tablet  Daily        02/26/20 0716            Follow Up: [facility] [address], [place] [id number] [place] [id number] [phone] Call  To schedule an appointment for close follow up     This chart was dictated using voice recognition software.  Despite best efforts to proofread,  errors can occur which can change the documentation meaning.   Fatima Blank, MD 02/26/20 [phone]

## 2021-03-21 ENCOUNTER — Encounter (HOSPITAL_BASED_OUTPATIENT_CLINIC_OR_DEPARTMENT_OTHER): Payer: Self-pay | Admitting: *Deleted

## 2021-03-21 ENCOUNTER — Other Ambulatory Visit: Payer: Self-pay

## 2021-03-21 ENCOUNTER — Emergency Department (HOSPITAL_BASED_OUTPATIENT_CLINIC_OR_DEPARTMENT_OTHER)
Admission: EM | Admit: 2021-03-21 | Discharge: 2021-03-22 | Disposition: A | Discharge status: Home / Self Care | Payer: Medicaid - Out of State | Attending: Emergency Medicine | Admitting: Emergency Medicine

## 2021-03-21 DIAGNOSIS — N12 Tubulo-interstitial nephritis, not specified as acute or chronic: Secondary | ICD-10-CM | POA: Diagnosis not present

## 2021-03-21 DIAGNOSIS — Z79899 Other long term (current) drug therapy: Secondary | ICD-10-CM | POA: Insufficient documentation

## 2021-03-21 DIAGNOSIS — F17210 Nicotine dependence, cigarettes, uncomplicated: Secondary | ICD-10-CM | POA: Insufficient documentation

## 2021-03-21 DIAGNOSIS — R1032 Left lower quadrant pain: Secondary | ICD-10-CM | POA: Diagnosis not present

## 2021-03-21 DIAGNOSIS — Z85028 Personal history of other malignant neoplasm of stomach: Secondary | ICD-10-CM | POA: Insufficient documentation

## 2021-03-21 DIAGNOSIS — R112 Nausea with vomiting, unspecified: Secondary | ICD-10-CM | POA: Diagnosis present

## 2021-03-21 DIAGNOSIS — I10 Essential (primary) hypertension: Secondary | ICD-10-CM | POA: Diagnosis not present

## 2021-03-21 DIAGNOSIS — R197 Diarrhea, unspecified: Secondary | ICD-10-CM

## 2021-03-21 LAB — CBC
HCT: 33.6 % — ABNORMAL LOW (ref 36.0–46.0)
Hemoglobin: 11.3 g/dL — ABNORMAL LOW (ref 12.0–15.0)
MCH: 32.8 pg (ref 26.0–34.0)
MCHC: 33.6 g/dL (ref 30.0–36.0)
MCV: 97.4 fL (ref 80.0–100.0)
Platelets: 303 10*3/uL (ref 150–400)
RBC: 3.45 MIL/uL — ABNORMAL LOW (ref 3.87–5.11)
RDW: 13.3 % (ref 11.5–15.5)
WBC: 10.8 10*3/uL — ABNORMAL HIGH (ref 4.0–10.5)
nRBC: 0 % (ref 0.0–0.2)

## 2021-03-21 MED ORDER — SODIUM CHLORIDE 0.9 % IV SOLN
25.0000 mg | Freq: Once | INTRAVENOUS | Status: AC
  Administered 2021-03-21: 25 mg via INTRAVENOUS
  Filled 2021-03-21: qty 1

## 2021-03-21 MED ORDER — ONDANSETRON 4 MG PO TBDP
4.0000 mg | ORAL_TABLET | Freq: Once | ORAL | Status: AC | PRN
  Administered 2021-03-21: 4 mg via ORAL
  Filled 2021-03-21: qty 1

## 2021-03-21 MED ORDER — MORPHINE SULFATE (PF) 4 MG/ML IV SOLN
4.0000 mg | Freq: Once | INTRAVENOUS | Status: AC
  Administered 2021-03-21: 4 mg via INTRAVENOUS
  Filled 2021-03-21: qty 1

## 2021-03-21 MED ORDER — SODIUM CHLORIDE 0.9 % IV BOLUS
1000.0000 mL | Freq: Once | INTRAVENOUS | Status: AC
  Administered 2021-03-21: 1000 mL via INTRAVENOUS

## 2021-03-21 MED ORDER — ONDANSETRON HCL 4 MG/2ML IJ SOLN
4.0000 mg | Freq: Once | INTRAMUSCULAR | Status: AC
  Administered 2021-03-21: 4 mg via INTRAVENOUS
  Filled 2021-03-21: qty 2

## 2021-03-21 MED ORDER — PROMETHAZINE HCL 25 MG/ML IJ SOLN
INTRAMUSCULAR | Status: AC
  Filled 2021-03-21: qty 1

## 2021-03-21 NOTE — ED Triage Notes (Addendum)
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 States she has been taking tylenol pm for sleep but doesn't feel like she has taken too much. Hx of gastric cancer (currently in remission). C/o pain in low back and LLQ pain

## 2021-03-21 NOTE — ED Provider Notes (Signed)
[facility] DEPARTMENT Provider Note   CSN: [id number] Arrival date & time: 03/21/21  2047     History Chief Complaint  Patient presents with   Emesis    Kristina Graves is a 45 y.o. female.  Patient presents with bilateral low back pain for the past several days that has been intermittent but persistent.  Denies any fall or trauma.  Today around 2 PM she developed nausea and vomiting as well as left-sided abdominal pain.  The abdominal pain has been coming and going for several days as well. States she has vomited approximately 6 times today.  She has had 3 episodes of loose stools as well.  This has been nonbloody.  Denies any pain with urination but has had some orange discoloration to her urine.  No fevers.  No chest pain or shortness of breath.  Pain radiates to her left lower quadrant has been constant today with the vomiting.  Has had some irregular vaginal bleeding as well with multiple cycles over the past month.  Does have a history of Crohn's disease and is not on treatment currently.  Also has a history of gastric cancer in remission. Has been taking Tylenol PM for the pain to sleep.  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  She is never had this kind of vomiting episodes before.  The pain is constant across her low back worse on the left side rather than the right.  Pain radiates to the left lower quadrant today associated with nausea and vomiting.  The history is provided by the patient.  Emesis Associated symptoms: abdominal pain and diarrhea   Associated symptoms: no cough, no fever and no headaches       Past Medical History:  Diagnosis Date   Cancer ([place])    stomach    Crohn disease ([place])    Hypertension    ITP (idiopathic thrombocytopenic purpura)    Sarcoidosis     There are no problems to display for this patient.   Past Surgical History:  Procedure Laterality Date   colon reconstruction     OOPHORECTOMY        OB History   No obstetric history on file.     No family history on file.  Social History   Tobacco Use   Smoking status: Every Day    Types: Cigarettes   Smokeless tobacco: Never  Substance Use Topics   Alcohol use: No   Drug use: No    Home Medications Prior to Admission medications   Medication Sig Start Date End Date Taking? Authorizing Provider  acetaminophen (TYLENOL) 325 MG tablet Take 650 mg by mouth every 6 (six) hours as needed for moderate pain.    [provider]  atenolol (TENORMIN) 50 MG tablet Take 50 mg by mouth daily.    [provider]  ciprofloxacin (CIPRO) 500 MG tablet Take 1 tablet (500 mg total) by mouth 2 (two) times daily. 02/21/15   Palumbo, April, MD  dicyclomine (BENTYL) 20 MG tablet Take 1 tablet (20 mg total) by mouth 2 (two) times daily. 02/21/15   Harris, Abigail, PA-C  diltiazem (CARTIA XT) 240 MG 24 hr capsule Take 240 mg by mouth daily.    [provider]  ferrous fumarate (HEMOCYTE - 106 MG FE) 325 (106 FE) MG TABS tablet Take 1 tablet by mouth daily.    [provider]  hydrochlorothiazide (HYDRODIURIL) 25 MG tablet Take 25 mg by mouth daily.  [provider]  ibuprofen (ADVIL,MOTRIN) 200 MG tablet Take 800 mg by mouth every 6 (six) hours as needed for moderate pain.    [provider]  lisinopril (PRINIVIL,ZESTRIL) 40 MG tablet Take 40 mg by mouth daily.    [provider]  metroNIDAZOLE (FLAGYL) 500 MG tablet Take 1 tablet (500 mg total) by mouth 3 (three) times daily. One po bid x 7 days 02/21/15   Palumbo, April, MD  oxyCODONE (ROXICODONE) 15 MG immediate release tablet Take 15 mg by mouth every 4 (four) hours as needed for pain.    [provider]  Pancrelipase, Lip-Prot-Amyl, (CREON) 24000 UNITS CPEP Take 2 capsules by mouth 3 (three) times daily.    [provider]  pantoprazole (PROTONIX) 40 MG tablet Take 40 mg by mouth daily.    [provider]   promethazine (PHENERGAN) 25 MG tablet Take 1 tablet (25 mg total) by mouth every 6 (six) hours as needed for nausea or vomiting. 02/21/15   Margarita Mail, PA-C  traMADol (ULTRAM) 50 MG tablet Take 1 tablet (50 mg total) by mouth every 6 (six) hours as needed. 02/21/15   Margarita Mail, PA-C    Allergies    Patient has no known allergies.  Review of Systems   Review of Systems  Constitutional:  Positive for activity change and appetite change. Negative for fever.  HENT:  Negative for congestion and rhinorrhea.   Respiratory:  Negative for cough, chest tightness and shortness of breath.   Cardiovascular:  Negative for chest pain.  Gastrointestinal:  Positive for abdominal pain, diarrhea, nausea and vomiting.  Genitourinary:  Positive for dysuria, flank pain, hematuria, urgency and vaginal bleeding.  Musculoskeletal:  Positive for back pain.  Skin:  Negative for wound.  Neurological:  Negative for dizziness, weakness and headaches.   all other systems are negative except as noted in the HPI and PMH.   Physical Exam Updated Vital Signs BP (!) 147/90 (BP Location: Right Arm)   Pulse 60   Temp (!) 97.5 F (36.4 C) (Oral)   Resp 18   Ht 5' 1"  (1.549 m)   Wt 61.5 kg   LMP 03/13/2021   SpO2 100%   BMI 25.60 kg/m   Physical Exam Vitals and nursing note reviewed.  Constitutional:      General: She is not in acute distress.    Appearance: She is well-developed.  HENT:     Head: Normocephalic and atraumatic.     Mouth/Throat:     Pharynx: No oropharyngeal exudate.  Eyes:     Conjunctiva/sclera: Conjunctivae normal.     Pupils: Pupils are equal, round, and reactive to light.  Neck:     Comments: No meningismus. Cardiovascular:     Rate and Rhythm: Normal rate and regular rhythm.     Heart sounds: Normal heart sounds. No murmur heard. Pulmonary:     Effort: Pulmonary effort is normal. No respiratory distress.     Breath sounds: Normal breath sounds.  Abdominal:      Palpations: Abdomen is soft.     Tenderness: There is abdominal tenderness. There is no guarding or rebound.     Comments: TTP LLQ. No guarding or rebound  Musculoskeletal:        General: Tenderness present. Normal range of motion.     Cervical back: Normal range of motion and neck supple.     Comments: Paraspinal lumbar tenderness bilaterally.  No midline tenderness  5/5 strength in bilateral lower extremities. Ankle plantar  and dorsiflexion intact. Great toe extension intact bilaterally. +2 DP and PT pulses. +2 patellar reflexes bilaterally. Normal gait.   Skin:    General: Skin is warm.  Neurological:     Mental Status: She is alert and oriented to person, place, and time.     Cranial Nerves: No cranial nerve deficit.     Motor: No abnormal muscle tone.     Coordination: Coordination normal.     Comments:  5/5 strength throughout. CN 2-12 intact.Equal grip strength.   Psychiatric:        Behavior: Behavior normal.    ED Results / Procedures / Treatments   Labs (all labs ordered are listed, but only abnormal results are displayed) Labs Reviewed  COMPREHENSIVE METABOLIC PANEL - Abnormal; Notable for the following components:      Result Value   Glucose, Bld 112 (*)    All other components within normal limits  CBC - Abnormal; Notable for the following components:   WBC 10.8 (*)    RBC 3.45 (*)    Hemoglobin 11.3 (*)    HCT 33.6 (*)    All other components within normal limits  URINALYSIS, ROUTINE W REFLEX MICROSCOPIC - Abnormal; Notable for the following components:   APPearance CLOUDY (*)    Hgb urine dipstick TRACE (*)    Nitrite POSITIVE (*)    All other components within normal limits  ACETAMINOPHEN LEVEL - Abnormal; Notable for the following components:   Acetaminophen (Tylenol), Serum <10 (*)    All other components within normal limits  URINALYSIS, MICROSCOPIC (REFLEX) - Abnormal; Notable for the following components:   Bacteria, UA MANY (*)    All other  components within normal limits  URINE CULTURE  PREGNANCY, URINE  LIPASE, BLOOD    EKG None  Radiology CT ABDOMEN PELVIS W CONTRAST  Result Date: 03/22/2021 CLINICAL DATA:  Abdominal pain, acute, nonlocalized. Nausea and vomiting. Gastric cancer in remission. EXAM: CT ABDOMEN AND PELVIS WITH CONTRAST TECHNIQUE: Multidetector CT imaging of the abdomen and pelvis was performed using the standard protocol following bolus administration of intravenous contrast. CONTRAST:  163m OMNIPAQUE IOHEXOL 300 MG/ML  SOLN COMPARISON:  10/09/2020, 02/21/2015 FINDINGS: Lower chest: The visualized lung bases are clear. The visualized heart and pericardium are unremarkable. Hepatobiliary: No focal liver abnormality is seen. No gallstones, gallbladder wall thickening, or biliary dilatation. Pancreas: Unremarkable Spleen: Unremarkable Adrenals/Urinary Tract: The adrenal glands are unremarkable. The kidneys are normal in size and position. Asymmetric cortical atrophy noted within the upper pole the left kidney. Simple cortical cyst noted within the interpolar region of the left kidney. The kidneys are otherwise unremarkable. The bladder is decompressed and is unremarkable. Stomach/Bowel: Surgical changes of a distal gastrectomy and Roux-en-Y gastrojejunostomy are identified. The appendix is not clearly identified and may be absent. The residual stomach, small bowel, and large bowel are otherwise unremarkable. No free intraperitoneal gas or fluid. Vascular/Lymphatic: No significant vascular findings are present. No enlarged abdominal or pelvic lymph nodes. Reproductive: Status post right oophorectomy. The uterus and residual left ovary are unremarkable. Other: No abdominal wall hernia.  Rectum unremarkable. Musculoskeletal: No acute bone abnormality. No lytic or blastic bone lesion. Bilateral L5 pars defects without associated spondylolisthesis. IMPRESSION: No acute intra-abdominal pathology identified. No definite  radiographic explanation for the patient's reported symptoms. Status post distal gastrectomy and Roux-en-Y gastrojejunostomy. No evidence of obstruction. Electronically Signed   By: AFidela SalisburyM.D.   On: 03/22/2021 01:48    Procedures Procedures   Medications Ordered  in ED Medications  sodium chloride 0.9 % bolus 1,000 mL (has no administration in time range)  morphine 4 MG/ML injection 4 mg (has no administration in time range)  ondansetron (ZOFRAN) injection 4 mg (has no administration in time range)  ondansetron (ZOFRAN-ODT) disintegrating tablet 4 mg (4 mg Oral Given 03/21/21 2127)    ED Course  I have reviewed the triage vital signs and the nursing notes.  Pertinent labs & imaging results that were available during my care of the patient were reviewed by me and considered in my medical decision making (see chart for details).    MDM Rules/Calculators/A&P                          Lower abdominal pain, flank pain, nausea and vomiting with diarrhea.  Vital stable, no distress, abdomen is soft without peritoneal signs. Neurologically intact.  Low suspicion for cord compression or cauda equina.   hCG is negative.  UA is positive with nitrates and white blood cells.  Will send culture.  Labs reassuring without significant leukocytosis  CT scan obtained to evaluate for obstructing kidney stone.  This is negative.  No acute findings.  Stable postsurgical changes.  Will treat for suspected pyelonephritis.  IV Rocephin given while urine culture is pending.  Patient tolerating p.o.  Pain has resolved.  She is comfortable appearing.  Abdomen soft without peritoneal signs. She declines pelvic exam.  Low suspicion for ovarian torsion.  Her back pain has been ongoing for several days as well as her intermittent left lower quadrant pain.  It is possible she may have passed a kidney stone. Left ovary appears normal on CT scan. Chart review shows that she has had recurrent L sided  abdominal pain for several months.   No further vomiting.  She is tolerating p.o. Discussed p.o. hydration at home, antiemetics and antibiotics.  Follow-up with her PCP.  Return to the ED with intractable pain, fever, intractable vomiting, not able to urinate or other concerns. Final Clinical Impression(s) / ED Diagnoses Final diagnoses:  Pyelonephritis  Nausea vomiting and diarrhea    Rx / DC Orders ED Discharge Orders     None        Ryane Canavan, Annie Main, MD 03/22/21 [phone]

## 2021-03-22 ENCOUNTER — Emergency Department (HOSPITAL_BASED_OUTPATIENT_CLINIC_OR_DEPARTMENT_OTHER): Payer: Medicaid - Out of State

## 2021-03-22 LAB — COMPREHENSIVE METABOLIC PANEL
ALT: 7 U/L (ref 0–44)
AST: 19 U/L (ref 15–41)
Albumin: 4 g/dL (ref 3.5–5.0)
Alkaline Phosphatase: 47 U/L (ref 38–126)
Anion gap: 8 (ref 5–15)
BUN: 15 mg/dL (ref 6–20)
CO2: 22 mmol/L (ref 22–32)
Calcium: 9 mg/dL (ref 8.9–10.3)
Chloride: 106 mmol/L (ref 98–111)
Creatinine, Ser: 0.78 mg/dL (ref 0.44–1.00)
GFR, Estimated: >60 mL/min (ref >60)
Glucose, Bld: 112 mg/dL — ABNORMAL HIGH (ref 70–99)
Potassium: 4.1 mmol/L (ref 3.5–5.1)
Sodium: 136 mmol/L (ref 135–145)
Total Bilirubin: 0.4 mg/dL (ref 0.3–1.2)
Total Protein: 6.8 g/dL (ref 6.5–8.1)

## 2021-03-22 LAB — URINALYSIS, ROUTINE W REFLEX MICROSCOPIC
Bilirubin Urine: NEGATIVE
Glucose, UA: NEGATIVE mg/dL
Ketones, ur: NEGATIVE mg/dL
Leukocytes,Ua: NEGATIVE
Nitrite: POSITIVE — AB
Protein, ur: NEGATIVE mg/dL
Specific Gravity, Urine: 1.02 (ref 1.005–1.030)
pH: 7 (ref 5.0–8.0)

## 2021-03-22 LAB — ACETAMINOPHEN LEVEL: Acetaminophen (Tylenol), Serum: <10 ug/mL — ABNORMAL LOW (ref 10–30)

## 2021-03-22 LAB — URINALYSIS, MICROSCOPIC (REFLEX)

## 2021-03-22 LAB — PREGNANCY, URINE: Preg Test, Ur: NEGATIVE

## 2021-03-22 LAB — LIPASE, BLOOD: Lipase: 19 U/L (ref 11–51)

## 2021-03-22 MED ORDER — HEPARIN SOD (PORK) LOCK FLUSH 100 UNIT/ML IV SOLN
500.0000 [IU] | Freq: Once | INTRAVENOUS | Status: AC
  Administered 2021-03-22: 500 [IU]
  Filled 2021-03-22: qty 5

## 2021-03-22 MED ORDER — ONDANSETRON HCL 4 MG/2ML IJ SOLN
4.0000 mg | Freq: Once | INTRAMUSCULAR | Status: AC
  Administered 2021-03-22: 4 mg via INTRAVENOUS
  Filled 2021-03-22: qty 2

## 2021-03-22 MED ORDER — ONDANSETRON 4 MG PO TBDP: 4.0000 mg | ORAL_TABLET | Freq: Three times a day (TID) | ORAL | 0 refills | Status: DC | PRN

## 2021-03-22 MED ORDER — IBUPROFEN 600 MG PO TABS: 600.0000 mg | ORAL_TABLET | Freq: Four times a day (QID) | ORAL | 0 refills | Status: DC | PRN

## 2021-03-22 MED ORDER — IOHEXOL 300 MG/ML  SOLN
100.0000 mL | Freq: Once | INTRAMUSCULAR | Status: AC | PRN
  Administered 2021-03-22: 100 mL via INTRAVENOUS

## 2021-03-22 MED ORDER — HYDROMORPHONE HCL 1 MG/ML IJ SOLN
1.0000 mg | Freq: Once | INTRAMUSCULAR | Status: AC
  Administered 2021-03-22: 1 mg via INTRAVENOUS
  Filled 2021-03-22: qty 1

## 2021-03-22 MED ORDER — SODIUM CHLORIDE 0.9 % IV SOLN
1.0000 g | Freq: Once | INTRAVENOUS | Status: AC
  Administered 2021-03-22: 1 g via INTRAVENOUS
  Filled 2021-03-22: qty 10

## 2021-03-22 MED ORDER — CEPHALEXIN 500 MG PO CAPS: 500.0000 mg | ORAL_CAPSULE | Freq: Three times a day (TID) | ORAL | 0 refills | Status: DC

## 2021-03-22 NOTE — Discharge Instructions (Addendum)
As we discussed you may have passed a kidney stone.  Take the antibiotics as prescribed for a kidney infection.  Follow-up with your doctor.  Return to the ED with worsening pain, fever, vomiting, any other concerns.

## 2021-03-24 LAB — URINE CULTURE: Culture: >=100000 — AB

## 2021-03-25 ENCOUNTER — Telehealth: Payer: Self-pay | Admitting: *Deleted

## 2021-03-25 NOTE — Telephone Encounter (Signed)
Post ED Visit - Positive Culture Follow-up  Culture report reviewed by antimicrobial stewardship pharmacist: [place] Team []  Elenor Quinones, Pharm.D. []  Heide Guile, Pharm.D., BCPS AQ-ID []  Parks Neptune, Pharm.D., BCPS []  Alycia Rossetti, Pharm.D., BCPS []  [place], Pharm.D., BCPS, AAHIVP []  Legrand Como, Pharm.D., BCPS, AAHIVP []  Salome Arnt, PharmD, BCPS []  Johnnette Gourd, PharmD, BCPS []  Hughes Better, PharmD, BCPS []  Leeroy Cha, PharmD []  Laqueta Linden, PharmD, BCPS []  Albertina Parr, PharmD  [place] Team []  Leodis Sias, PharmD []  Lindell Spar, PharmD []  Royetta Asal, PharmD []  Graylin Shiver, Rph []  Rema Fendt) Glennon Mac, PharmD []  Arlyn Dunning, PharmD []  Netta Cedars, PharmD []  Dia Sitter, PharmD []  Leone Haven, PharmD []  Gretta Arab, PharmD []  Theodis Shove, PharmD []  Peggyann Juba, PharmD []  Reuel Boom, PharmD   Positive urine culture Treated with Cephalexin, organism sensitive to the same and no further patient follow-up is required at this time.  Joetta Manners, PharmD  Harlon Flor Talley 03/25/2021, 9:31 AM

## 2021-10-12 IMAGING — CT CT ABD-PELV W/ CM
2 of 5 series · 16 of 46 positions shown, 18 images · IV contrast (Omnipaque)
Comparison: Five days ago

CLINICAL DATA: Vaginal bleeding.  Abdominal pain

EXAM:
CT ABDOMEN AND PELVIS WITH CONTRAST
TECHNIQUE: Multidetector CT imaging of the abdomen and pelvis was performed
using the standard protocol following bolus administration of
intravenous contrast.
CONTRAST:  100mL OMNIPAQUE IOHEXOL 300 MG/ML  SOLN

[Series 2: axial st · axial · 0.70mm/px · z∈[-265,+90]mm · 13 of 81 slices shown, 15 images]
[im 5/81  soft-tissue]
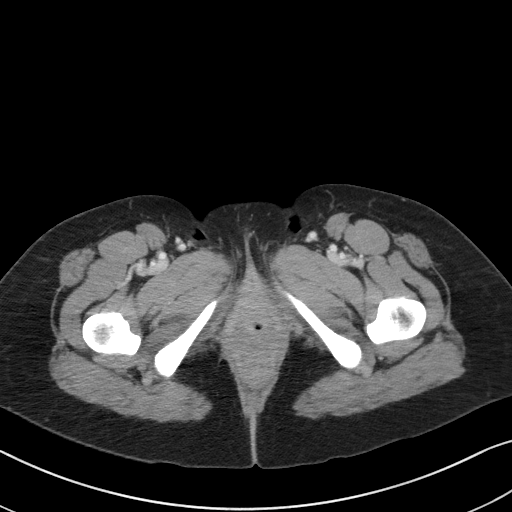
[im 5/81  bone]
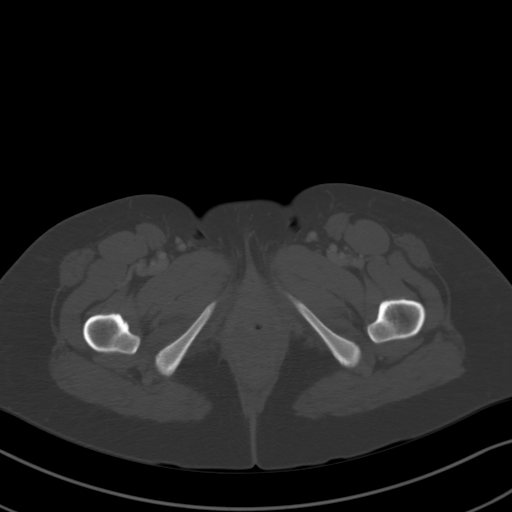
[im 9/81  soft-tissue]
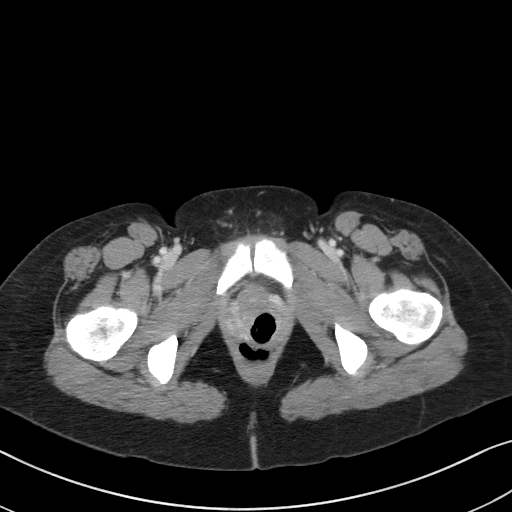
[im 18/81  soft-tissue]
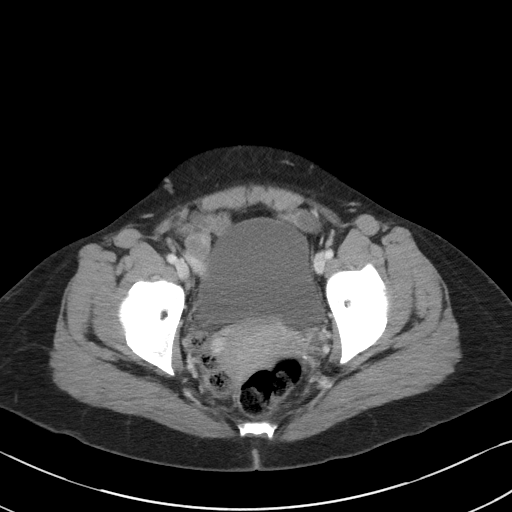
[im 23/81  soft-tissue]
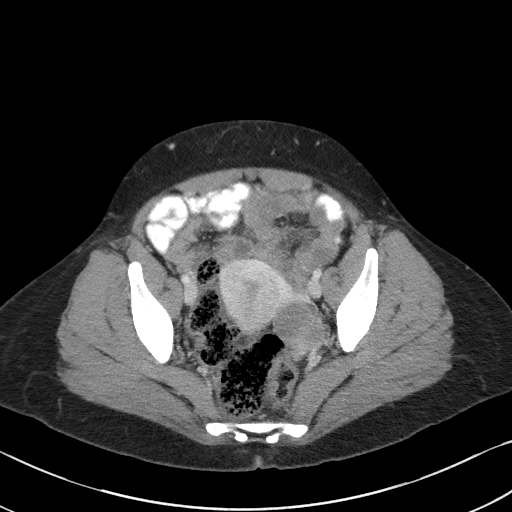
[im 27/81  soft-tissue]
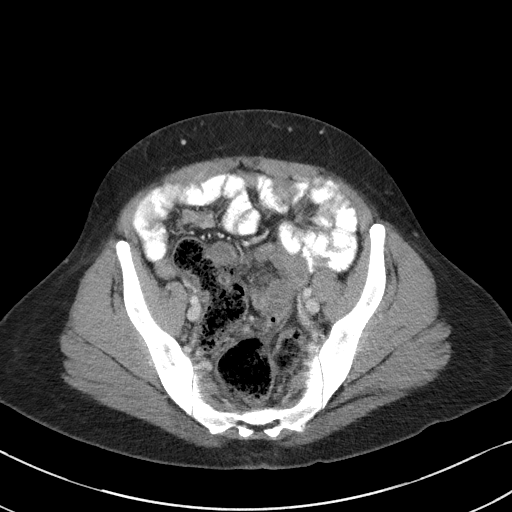
[im 36/81  soft-tissue]
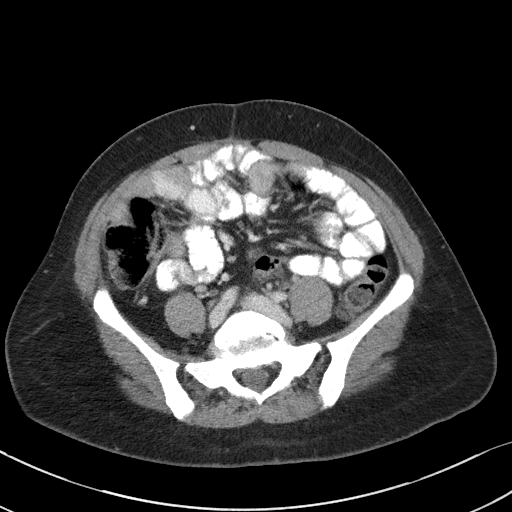
[im 41/81  soft-tissue]
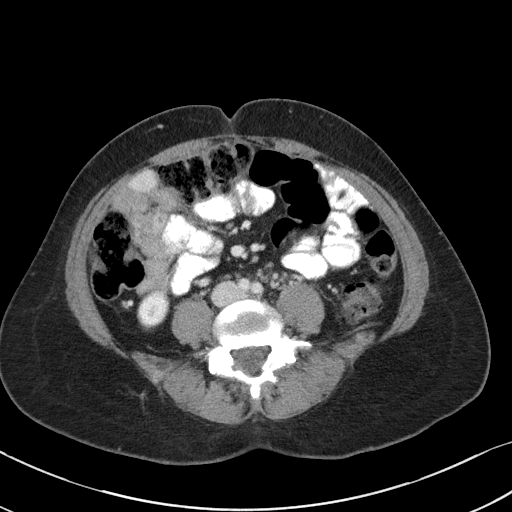
[im 45/81  soft-tissue]
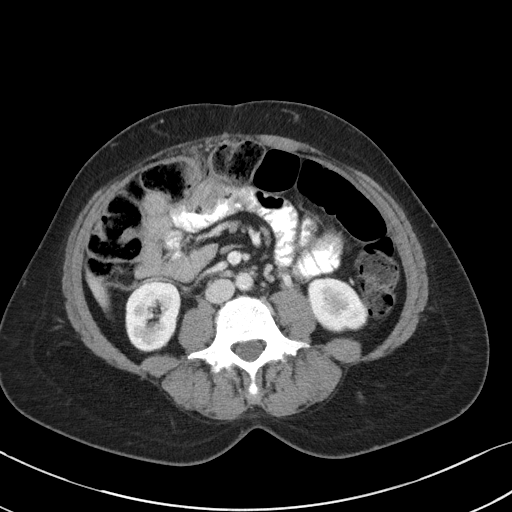
[im 54/81  soft-tissue]
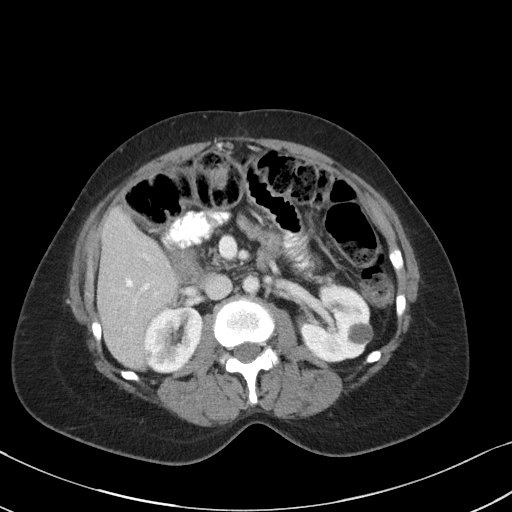
[im 54/81  bone]
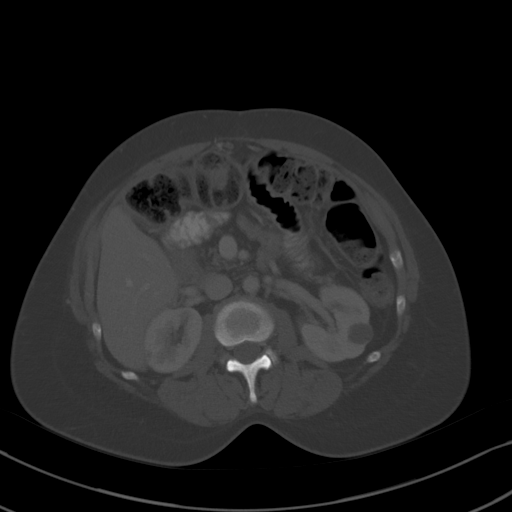
[im 58/81  soft-tissue]
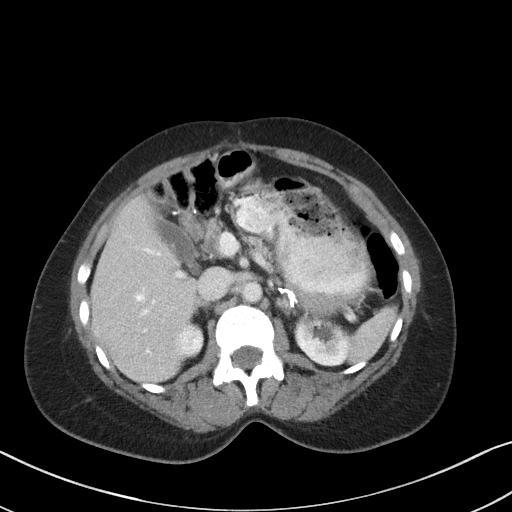
[im 63/81  soft-tissue]
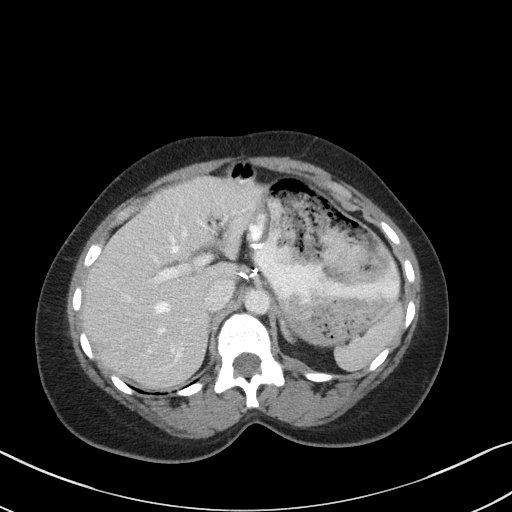
[im 72/81  soft-tissue]
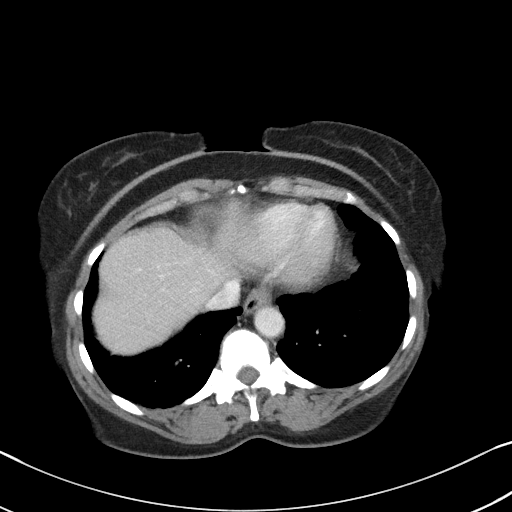
[im 76/81  soft-tissue]
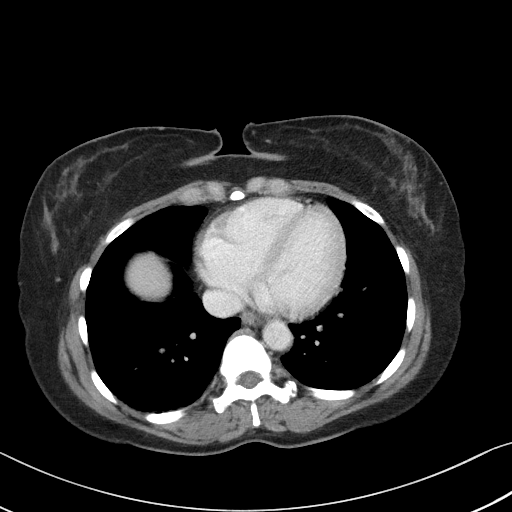

[Series 5: coronal st · coronal · 0.69mm/px · 3 of 79 slices shown]
[im 27/79  soft-tissue]
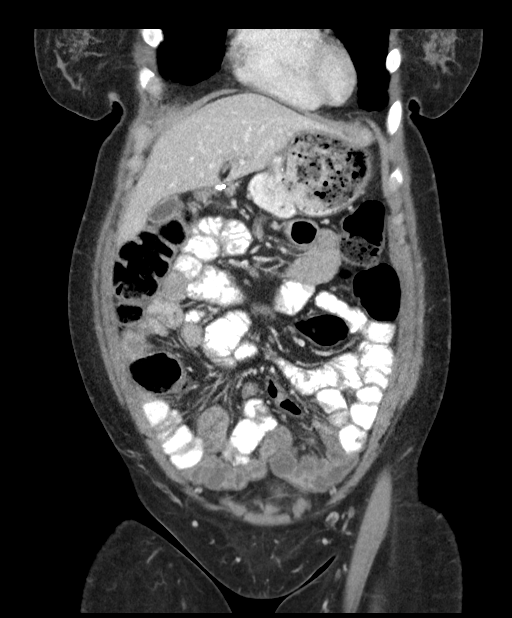
[im 35/79  soft-tissue]
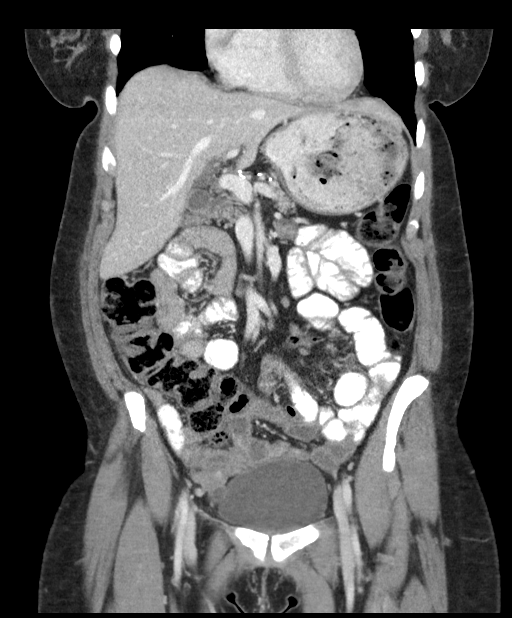
[im 44/79  soft-tissue]
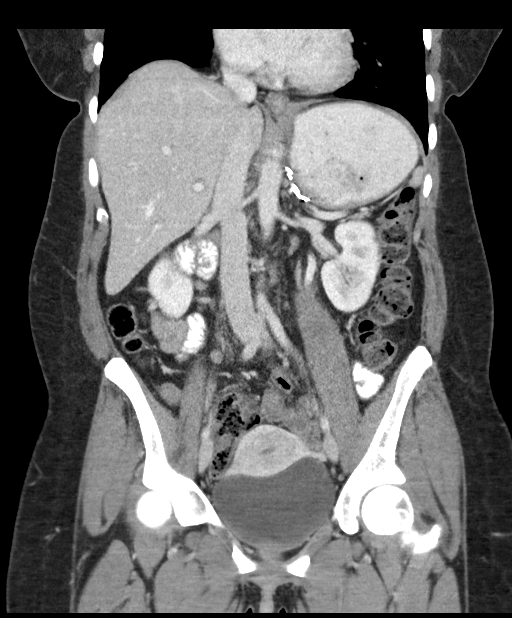

[16 of 46 positions shown; findings below may reference images not displayed]

FINDINGS: Lower chest:  No contributory findings.

Hepatobiliary: No focal liver abnormality.No evidence of biliary
obstruction or stone.

Pancreas: Generalized atrophy.

Spleen: Unremarkable.

Adrenals/Urinary Tract: Negative adrenals. No hydronephrosis or
stone. Left upper pole renal cortical scarring. Small left renal
cortical cysts. Unremarkable bladder.

Stomach/Bowel: Distal gastrectomy. Diffuse colonic stool. No
pericecal inflammation seen.

Vascular/Lymphatic: No acute vascular abnormality. No mass or
adenopathy.

Reproductive:No pathologic findings. Left ovarian cystic densities
which are follicles based on development from prior. A tampon is
present.

Other: No ascites or pneumoperitoneum.

Musculoskeletal: No acute abnormalities. Chronic bilateral L5 pars
defects.
IMPRESSION: 1. No acute finding.
2. Diffuse colonic stool, please correlate for constipation.
3. Distal gastrectomy.  Negative for bowel obstruction.

## 2022-02-07 ENCOUNTER — Emergency Department (HOSPITAL_BASED_OUTPATIENT_CLINIC_OR_DEPARTMENT_OTHER): Payer: Commercial Managed Care - HMO

## 2022-02-07 ENCOUNTER — Encounter (HOSPITAL_BASED_OUTPATIENT_CLINIC_OR_DEPARTMENT_OTHER): Payer: Self-pay | Admitting: Emergency Medicine

## 2022-02-07 ENCOUNTER — Other Ambulatory Visit: Payer: Self-pay

## 2022-02-07 ENCOUNTER — Emergency Department (HOSPITAL_BASED_OUTPATIENT_CLINIC_OR_DEPARTMENT_OTHER)
Admission: EM | Admit: 2022-02-07 | Discharge: 2022-02-07 | Disposition: A | Discharge status: Home / Self Care | Payer: Commercial Managed Care - HMO | Attending: Emergency Medicine | Admitting: Emergency Medicine

## 2022-02-07 DIAGNOSIS — R042 Hemoptysis: Secondary | ICD-10-CM

## 2022-02-07 DIAGNOSIS — R079 Chest pain, unspecified: Secondary | ICD-10-CM | POA: Insufficient documentation

## 2022-02-07 DIAGNOSIS — Z20822 Contact with and (suspected) exposure to covid-19: Secondary | ICD-10-CM | POA: Insufficient documentation

## 2022-02-07 DIAGNOSIS — R0602 Shortness of breath: Secondary | ICD-10-CM | POA: Diagnosis not present

## 2022-02-07 DIAGNOSIS — D649 Anemia, unspecified: Secondary | ICD-10-CM | POA: Insufficient documentation

## 2022-02-07 DIAGNOSIS — Z809 Family history of malignant neoplasm, unspecified: Secondary | ICD-10-CM | POA: Insufficient documentation

## 2022-02-07 DIAGNOSIS — Z79899 Other long term (current) drug therapy: Secondary | ICD-10-CM | POA: Diagnosis not present

## 2022-02-07 DIAGNOSIS — M546 Pain in thoracic spine: Secondary | ICD-10-CM | POA: Diagnosis not present

## 2022-02-07 DIAGNOSIS — R112 Nausea with vomiting, unspecified: Secondary | ICD-10-CM | POA: Diagnosis present

## 2022-02-07 DIAGNOSIS — Z7951 Long term (current) use of inhaled steroids: Secondary | ICD-10-CM | POA: Diagnosis not present

## 2022-02-07 DIAGNOSIS — R1010 Upper abdominal pain, unspecified: Secondary | ICD-10-CM | POA: Diagnosis not present

## 2022-02-07 LAB — URINALYSIS, ROUTINE W REFLEX MICROSCOPIC
Bilirubin Urine: NEGATIVE
Glucose, UA: NEGATIVE mg/dL
Ketones, ur: NEGATIVE mg/dL
Leukocytes,Ua: NEGATIVE
Nitrite: NEGATIVE
Protein, ur: NEGATIVE mg/dL
Specific Gravity, Urine: 1.025 (ref 1.005–1.030)
pH: 6.5 (ref 5.0–8.0)

## 2022-02-07 LAB — CBC
HCT: 32.6 % — ABNORMAL LOW (ref 36.0–46.0)
Hemoglobin: 10.6 g/dL — ABNORMAL LOW (ref 12.0–15.0)
MCH: 32.3 pg (ref 26.0–34.0)
MCHC: 32.5 g/dL (ref 30.0–36.0)
MCV: 99.4 fL (ref 80.0–100.0)
Platelets: 239 10*3/uL (ref 150–400)
RBC: 3.28 MIL/uL — ABNORMAL LOW (ref 3.87–5.11)
RDW: 13.8 % (ref 11.5–15.5)
WBC: 8.8 10*3/uL (ref 4.0–10.5)
nRBC: 0 % (ref 0.0–0.2)

## 2022-02-07 LAB — COMPREHENSIVE METABOLIC PANEL
ALT: 11 U/L (ref 0–44)
AST: 25 U/L (ref 15–41)
Albumin: 3.6 g/dL (ref 3.5–5.0)
Alkaline Phosphatase: 55 U/L (ref 38–126)
Anion gap: 8 (ref 5–15)
BUN: 18 mg/dL (ref 6–20)
CO2: 22 mmol/L (ref 22–32)
Calcium: 8.7 mg/dL — ABNORMAL LOW (ref 8.9–10.3)
Chloride: 109 mmol/L (ref 98–111)
Creatinine, Ser: 0.94 mg/dL (ref 0.44–1.00)
GFR, Estimated: >60 mL/min (ref >60)
Glucose, Bld: 84 mg/dL (ref 70–99)
Potassium: 3.8 mmol/L (ref 3.5–5.1)
Sodium: 139 mmol/L (ref 135–145)
Total Bilirubin: 0.5 mg/dL (ref 0.3–1.2)
Total Protein: 6.6 g/dL (ref 6.5–8.1)

## 2022-02-07 LAB — URINALYSIS, MICROSCOPIC (REFLEX)

## 2022-02-07 LAB — PREGNANCY, URINE: Preg Test, Ur: NEGATIVE

## 2022-02-07 LAB — TROPONIN I (HIGH SENSITIVITY): Troponin I (High Sensitivity): 2 ng/L (ref <18)

## 2022-02-07 LAB — LIPASE, BLOOD: Lipase: 20 U/L (ref 11–51)

## 2022-02-07 LAB — SARS CORONAVIRUS 2 BY RT PCR: SARS Coronavirus 2 by RT PCR: NEGATIVE

## 2022-02-07 MED ORDER — BENZONATATE 100 MG PO CAPS: 100.0000 mg | ORAL_CAPSULE | Freq: Three times a day (TID) | ORAL | 0 refills | Status: DC

## 2022-02-07 MED ORDER — PROMETHAZINE HCL 25 MG/ML IJ SOLN
INTRAMUSCULAR | Status: AC
  Filled 2022-02-07: qty 1

## 2022-02-07 MED ORDER — SODIUM CHLORIDE 0.9 % IV BOLUS
1000.0000 mL | Freq: Once | INTRAVENOUS | Status: AC
  Administered 2022-02-07: 1000 mL via INTRAVENOUS

## 2022-02-07 MED ORDER — PREDNISONE 20 MG PO TABS: 40.0000 mg | ORAL_TABLET | Freq: Every day | ORAL | 0 refills | Status: AC

## 2022-02-07 MED ORDER — MORPHINE SULFATE (PF) 4 MG/ML IV SOLN
4.0000 mg | Freq: Once | INTRAVENOUS | Status: AC
  Administered 2022-02-07: 4 mg via INTRAVENOUS
  Filled 2022-02-07: qty 1

## 2022-02-07 MED ORDER — SODIUM CHLORIDE 0.9 % IV SOLN
12.5000 mg | Freq: Four times a day (QID) | INTRAVENOUS | Status: DC | PRN
Start: 2022-02-07 — End: 2022-02-08
  Filled 2022-02-07: qty 0.5

## 2022-02-07 MED ORDER — ONDANSETRON 4 MG PO TBDP
4.0000 mg | ORAL_TABLET | Freq: Once | ORAL | Status: AC | PRN
  Administered 2022-02-07: 4 mg via ORAL
  Filled 2022-02-07: qty 1

## 2022-02-07 MED ORDER — HEPARIN SOD (PORK) LOCK FLUSH 100 UNIT/ML IV SOLN: 500.0000 [IU] | Freq: Once | INTRAVENOUS | Status: DC

## 2022-02-07 MED ORDER — IOHEXOL 350 MG/ML SOLN
100.0000 mL | Freq: Once | INTRAVENOUS | Status: AC | PRN
  Administered 2022-02-07: 100 mL via INTRAVENOUS

## 2022-02-07 MED ORDER — PROMETHAZINE HCL 25 MG PO TABS: 25.0000 mg | ORAL_TABLET | Freq: Four times a day (QID) | ORAL | 0 refills | Status: DC | PRN

## 2022-02-07 MED ORDER — ALBUTEROL SULFATE HFA 108 (90 BASE) MCG/ACT IN AERS: 1.0000 | INHALATION_SPRAY | Freq: Four times a day (QID) | RESPIRATORY_TRACT | 0 refills | Status: DC | PRN

## 2022-02-07 NOTE — Discharge Instructions (Addendum)
It was a pleasure taking care of you today.  As discussed, all of your labs were reassuring.  Your CT scans did not show any evidence of a blood clot.  It did show some enlarged lymph nodes in your lungs.  I am sending you home with steroids for a sarcoidosis flare.  I am also sending you home with cough medication, nausea medication, and albuterol.  Take as needed.  I have included the number of the pulmonologist.  Please call to schedule an appointment for further evaluation.  Return to the ER for any worsening symptoms.

## 2022-02-07 NOTE — ED Notes (Signed)
PA at bedside, will attempt imaging later

## 2022-02-07 NOTE — ED Provider Notes (Signed)
[place] EMERGENCY DEPARTMENT Provider Note   CSN: [id number] Arrival date & time: 02/07/22  1714     History  Chief Complaint  Patient presents with   Hemoptysis   Emesis    Kristina Graves is a 46 y.o. female with a past medical history significant for ITP, Crohn's disease, sarcoidosis, and history of gastric cancer who presents to the ED due to hemoptysis for the past 9 days.  Patient states she has been coughing up clots of blood.  Patient has a history of cancer.  She notes she has had previous blood clots however, is not currently on any blood thinners.  No fever or chills.  She endorses some upper back pain and chest pain.  She also admits to some shortness of breath.   Patient also endorses upper abdominal pain associated with nausea and vomiting.  Patient states symptoms have been going on for numerous weeks.  Patient was evaluated at the oncology urgent care on 8/7 for the same complaint where a CT abdomen was ordered which showed stable postsurgical changes and large amount of food debris in the stomach and large amount of stool burden.  No fever or chills.  Denies diarrhea.  History obtained from patient and past medical records. No interpreter used during encounter.       Home Medications Prior to Admission medications   Medication Sig Start Date End Date Taking? Authorizing Provider  albuterol (VENTOLIN HFA) 108 (90 Base) MCG/ACT inhaler Inhale 1-2 puffs into the lungs every 6 (six) hours as needed for wheezing or shortness of breath. 02/07/22  Yes Zenita Kister C, PA-C  benzonatate (TESSALON) 100 MG capsule Take 1 capsule (100 mg total) by mouth every 8 (eight) hours. 02/07/22  Yes Dinisha Cai, Druscilla Brownie, PA-C  predniSONE (DELTASONE) 20 MG tablet Take 2 tablets (40 mg total) by mouth daily for 5 days. 02/07/22 02/12/22 Yes Kamsiyochukwu Spickler, Druscilla Brownie, PA-C  promethazine (PHENERGAN) 25 MG tablet Take 1 tablet (25 mg total) by mouth every 6 (six) hours as needed for  nausea or vomiting. 02/07/22  Yes Nissa Stannard, Druscilla Brownie, PA-C  acetaminophen (TYLENOL) 325 MG tablet Take 650 mg by mouth every 6 (six) hours as needed for moderate pain.    [provider]  atenolol (TENORMIN) 50 MG tablet Take 50 mg by mouth daily.    [provider]  cephALEXin (KEFLEX) 500 MG capsule Take 1 capsule (500 mg total) by mouth 3 (three) times daily. 03/22/21   Rancour, Annie Main, MD  ciprofloxacin (CIPRO) 500 MG tablet Take 1 tablet (500 mg total) by mouth 2 (two) times daily. 02/21/15   Palumbo, April, MD  dicyclomine (BENTYL) 20 MG tablet Take 1 tablet (20 mg total) by mouth 2 (two) times daily. 02/21/15   Harris, Abigail, PA-C  diltiazem (CARTIA XT) 240 MG 24 hr capsule Take 240 mg by mouth daily.    [provider]  ferrous fumarate (HEMOCYTE - 106 MG FE) 325 (106 FE) MG TABS tablet Take 1 tablet by mouth daily.    [provider]  hydrochlorothiazide (HYDRODIURIL) 25 MG tablet Take 25 mg by mouth daily.    [provider]  ibuprofen (ADVIL) 600 MG tablet Take 1 tablet (600 mg total) by mouth every 6 (six) hours as needed. 03/22/21   Rancour, Annie Main, MD  lisinopril (PRINIVIL,ZESTRIL) 40 MG tablet Take 40 mg by mouth daily.    [provider]  metroNIDAZOLE (FLAGYL) 500 MG tablet Take 1 tablet (500 mg total) by mouth  3 (three) times daily. One po bid x 7 days 02/21/15   Palumbo, April, MD  ondansetron (ZOFRAN ODT) 4 MG disintegrating tablet Take 1 tablet (4 mg total) by mouth every 8 (eight) hours as needed for nausea or vomiting. 03/22/21   Rancour, Annie Main, MD  oxyCODONE (ROXICODONE) 15 MG immediate release tablet Take 15 mg by mouth every 4 (four) hours as needed for pain.    [provider]  Pancrelipase, Lip-Prot-Amyl, (CREON) 24000 UNITS CPEP Take 2 capsules by mouth 3 (three) times daily.    [provider]  pantoprazole (PROTONIX) 40 MG tablet Take 40 mg by mouth daily.    [provider]  promethazine  (PHENERGAN) 25 MG tablet Take 1 tablet (25 mg total) by mouth every 6 (six) hours as needed for nausea or vomiting. 02/21/15   Margarita Mail, PA-C  traMADol (ULTRAM) 50 MG tablet Take 1 tablet (50 mg total) by mouth every 6 (six) hours as needed. 02/21/15   Margarita Mail, PA-C      Allergies    Patient has no known allergies.    Review of Systems   Review of Systems  Constitutional:  Negative for chills and fever.  Respiratory:  Positive for shortness of breath.   Cardiovascular:  Positive for chest pain.  Gastrointestinal:  Positive for abdominal pain, nausea and vomiting. Negative for diarrhea.  All other systems reviewed and are negative.   Physical Exam Updated Vital Signs BP (!) 130/90   Pulse 94   Temp 97.9 F (36.6 C) (Oral)   Resp 16   SpO2 100%  Physical Exam Vitals and nursing note reviewed.  Constitutional:      General: She is not in acute distress.    Appearance: She is not ill-appearing.  HENT:     Head: Normocephalic.  Eyes:     Pupils: Pupils are equal, round, and reactive to light.  Cardiovascular:     Rate and Rhythm: Normal rate and regular rhythm.     Pulses: Normal pulses.     Heart sounds: Normal heart sounds. No murmur heard.    No friction rub. No gallop.  Pulmonary:     Effort: Pulmonary effort is normal.     Breath sounds: Normal breath sounds.  Abdominal:     General: Abdomen is flat. There is no distension.     Palpations: Abdomen is soft.     Tenderness: There is no abdominal tenderness. There is no guarding or rebound.     Comments: Old surgical scar.  Diffuse tenderness.  Musculoskeletal:        General: Normal range of motion.     Cervical back: Neck supple.  Skin:    General: Skin is warm and dry.  Neurological:     General: No focal deficit present.     Mental Status: She is alert.  Psychiatric:        Mood and Affect: Mood normal.        Behavior: Behavior normal.     ED Results / Procedures / Treatments   Labs (all labs  ordered are listed, but only abnormal results are displayed) Labs Reviewed  COMPREHENSIVE METABOLIC PANEL - Abnormal; Notable for the following components:      Result Value   Calcium 8.7 (*)    All other components within normal limits  CBC - Abnormal; Notable for the following components:   RBC 3.28 (*)    Hemoglobin 10.6 (*)    HCT 32.6 (*)  All other components within normal limits  URINALYSIS, ROUTINE W REFLEX MICROSCOPIC - Abnormal; Notable for the following components:   Hgb urine dipstick TRACE (*)    All other components within normal limits  URINALYSIS, MICROSCOPIC (REFLEX) - Abnormal; Notable for the following components:   Bacteria, UA FEW (*)    All other components within normal limits  SARS CORONAVIRUS 2 BY RT PCR  LIPASE, BLOOD  PREGNANCY, URINE  TROPONIN I (HIGH SENSITIVITY)  TROPONIN I (HIGH SENSITIVITY)    EKG None  Radiology CT Angio Chest PE W and/or Wo Contrast  Result Date: 02/07/2022 CLINICAL DATA:  Suspected pulmonary embolism. EXAM: CT ANGIOGRAPHY CHEST WITH CONTRAST TECHNIQUE: Multidetector CT imaging of the chest was performed using the standard protocol during bolus administration of intravenous contrast. Multiplanar CT image reconstructions and MIPs were obtained to evaluate the vascular anatomy. RADIATION DOSE REDUCTION: This exam was performed according to the departmental dose-optimization program which includes automated exposure control, adjustment of the mA and/or kV according to patient size and/or use of iterative reconstruction technique. CONTRAST:  159m OMNIPAQUE IOHEXOL 350 MG/ML SOLN COMPARISON:  March 08, 2019 FINDINGS: Cardiovascular: A right-sided venous Port-A-Cath is in place. The thoracic aorta is normal in appearance, without evidence of aortic aneurysm or dissection. The segmental and subsegmental pulmonary arteries are limited in evaluation secondary to suboptimal opacification with intravenous contrast. No evidence of pulmonary  embolism. Normal heart size. No pericardial effusion. Mediastinum/Nodes: Mild to moderate severity pretracheal and right hilar lymphadenopathy is seen. Mild AP window, subcarinal and left hilar lymphadenopathy is also noted. Thyroid gland, trachea, and esophagus demonstrate no significant findings. Lungs/Pleura: Lungs are clear. No pleural effusion or pneumothorax. Upper Abdomen: Postoperative changes are seen consistent with the patient's history of distal partial gastrectomy and Roux-en-Y gastrojejunostomy. Musculoskeletal: No chest wall abnormality. No acute or significant osseous findings. Review of the MIP images confirms the above findings. IMPRESSION: 1. No CT evidence of pulmonary embolism. 2. Mild to moderate severity mediastinal and right hilar lymphadenopathy, nonspecific. 3. Postoperative changes consistent with the patient's history of distal partial gastrectomy and Roux-en-Y gastrojejunostomy. Electronically Signed   By: TVirgina NorfolkM.D.   On: 02/07/2022 21:56   CT ABDOMEN PELVIS W CONTRAST  Result Date: 02/07/2022 CLINICAL DATA:  Nausea and vomiting. EXAM: CT ABDOMEN AND PELVIS WITH CONTRAST TECHNIQUE: Multidetector CT imaging of the abdomen and pelvis was performed using the standard protocol following bolus administration of intravenous contrast. RADIATION DOSE REDUCTION: This exam was performed according to the departmental dose-optimization program which includes automated exposure control, adjustment of the mA and/or kV according to patient size and/or use of iterative reconstruction technique. CONTRAST:  1061mOMNIPAQUE IOHEXOL 350 MG/ML SOLN COMPARISON:  January 26, 2022 FINDINGS: Lower chest: No acute abnormality. Hepatobiliary: No focal liver abnormality is seen. No gallstones, gallbladder wall thickening, or biliary dilatation. Pancreas: Unremarkable. No pancreatic ductal dilatation or surrounding inflammatory changes. Spleen: Normal in size without focal abnormality.  Adrenals/Urinary Tract: Adrenal glands are unremarkable. Kidneys are normal in size, without renal calculi or hydronephrosis. A 0.7 cm simple cyst is seen within the lateral aspect of the mid to upper right kidney. A 1.6 cm simple cyst is seen along the posterolateral aspect of the mid to lower left kidney. The urinary bladder is empty and subsequently limited in evaluation. Stomach/Bowel: Multiple surgical clips are seen within the upper abdomen, along the posterior aspect of the body of the stomach and posterior aspect of the left lobe of the liver. This is consistent with  the patient's history of distal gastrectomy and Roux-en-Y gastrojejunostomy. A large amount of food debris is again seen within the gastric lumen. The appendix is not clearly identified. Stool is seen throughout the colon. No evidence of bowel wall thickening, distention, or inflammatory changes. Vascular/Lymphatic: No significant vascular findings are present. No enlarged abdominal or pelvic lymph nodes. Reproductive: Uterus and bilateral adnexa are unremarkable. Other: No abdominal wall hernia or abnormality. No abdominopelvic ascites. 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 IMPRESSION: 1. Findings consistent with the patient's history of distal gastrectomy and Roux-en-Y gastrojejunostomy. 2. Large amount of food products within the gastric lumen. 3. Bilateral simple renal cysts. Correlation with nonemergent renal ultrasound is recommended. This recommendation follows ACR consensus guidelines: Management of the Incidental Renal Mass on CT: A White Paper of the ACR Incidental Findings Committee. J Am Coll Radiol [phone]. Electronically Signed   By: Virgina Norfolk M.D.   On: 02/07/2022 21:51   DG Chest Portable 1 View  Result Date: 02/07/2022 CLINICAL DATA:  Hemoptysis for several days, initial encounter EXAM: PORTABLE CHEST 1 VIEW COMPARISON:  02/03/2019 FINDINGS: Cardiac shadow is within normal  limits. Right chest wall port is again seen and stable. Lungs are clear. No bony abnormality is noted. IMPRESSION: No active disease. Electronically Signed   By: Inez Catalina M.D.   On: 02/07/2022 19:12    Procedures Procedures    Medications Ordered in ED Medications  promethazine (PHENERGAN) 12.5 mg in sodium chloride 0.9 % 50 mL IVPB (has no administration in time range)  ondansetron (ZOFRAN-ODT) disintegrating tablet 4 mg (4 mg Oral Given 02/07/22 1729)  sodium chloride 0.9 % bolus 1,000 mL (1,000 mLs Intravenous New Bag/Given 02/07/22 2031)  morphine (PF) 4 MG/ML injection 4 mg (4 mg Intravenous Given 02/07/22 2028)  promethazine (PHENERGAN) 25 MG/ML injection (  Given 02/07/22 2035)  iohexol (OMNIPAQUE) 350 MG/ML injection 100 mL (100 mLs Intravenous Contrast Given 02/07/22 2112)    ED Course/ Medical Decision Making/ A&P Clinical Course as of 02/07/22 2229  Sun Feb 07, 2022  2040 Hemoglobin(!): 10.6 [CA]  2040 Platelets: 239 [CA]  2040 SARS Coronavirus 2 by RT PCR: NEGATIVE [CA]    Clinical Course User Index [CA] Suzy Bouchard, PA-C                           Medical Decision Making Amount and/or Complexity of Data Reviewed External Data Reviewed: notes.    Details: Oncology UC notes Labs: ordered. Decision-making details documented in ED Course. Radiology: ordered and independent interpretation performed. Decision-making details documented in ED Course. ECG/medicine tests: ordered and independent interpretation performed. Decision-making details documented in ED Course.  Risk Prescription drug management.   46 year old female presents to the ED due to hemoptysis and nausea/vomiting.  Hemoptysis started roughly 9 days ago and nausea and vomiting has been ongoing for numerous weeks.  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  Patient also has a history of sarcoidosis, ITP, and history of  blood clots.  Patient is not currently on any blood thinners.  Upon arrival, stable vitals.  Patient in no acute distress.  Physical exam significant for diffuse tenderness.  Routine labs ordered to check platelet count and rule out electrolyte abnormalities.  Chest x-ray to rule our pneumonia.  CTA chest to rule out PE. Cardiac labs ordered to rule out cardiac etiology of chest pain.  CBC significant for mild anemia with hemoglobin 10.6.  No leukocytosis.  Normal lipase at 20.  Doubt pancreatitis.  COVID-negative.  Troponin normal.  Doubt ACS.  CMP reassuring.  Normal renal function. no major electrolyte derangements.  Normal platelet count.  UA significant for trace hematuria and few bacteria.  No signs of infection.  Pregnancy test negative.  EKG demonstrates normal sinus rhythm.  No signs of acute ischemia.  Chest x-ray personally reviewed and interpreted which is negative for any signs of pneumonia, pneumothorax, or widened mediastinum.  CT abdomen personally reviewed which demonstrates findings consistent with patient's history of distal gastrectomy.  No bowel obstruction. CTA chest negative for PE.  It does show mediastinal and hilar lymphadenopathy.  10:22 PM reassessed patient at bedside. She notes she feels better after fluids and nausea medication.  No major hemoptysis while here in the ED.  Patient has remained hemodynamically stable during her entire ED stay.  Given patient's reassuring work-up, feel patient is stable for discharge.   Patient notes she feels as if she is having a sarcoidosis flare.  Will discharge with prednisone.  Patient has a pulmonology appointment on 9/11 with [facility] however, notes she would prefer to go to [facility] due to health insurance.  Pulmonologist number given to patient at discharge.  Offered admission for sarcoidosis flare however, patient notes she feels comfortable going home which I find to be reasonable given reassuring work-up. Strict ED precautions discussed  with patient. Patient states understanding and agrees to plan. Patient discharged home in no acute distress and stable vitals  Discussed with Dr. Rogene Houston who agrees with assessment and plan.         Final Clinical Impression(s) / ED Diagnoses Final diagnoses:  Nausea and vomiting, unspecified vomiting type  Hemoptysis    Rx / DC Orders ED Discharge Orders          Ordered    predniSONE (DELTASONE) 20 MG tablet  Daily        02/07/22 2227    albuterol (VENTOLIN HFA) 108 (90 Base) MCG/ACT inhaler  Every 6 hours PRN        02/07/22 2227    benzonatate (TESSALON) 100 MG capsule  Every 8 hours        02/07/22 2227    promethazine (PHENERGAN) 25 MG tablet  Every 6 hours PRN        02/07/22 2228              Karie Kirks 02/07/22 2304    Fredia Sorrow, MD 02/12/22 [phone]

## 2022-02-07 NOTE — ED Triage Notes (Signed)
Pt reports she is coughing up clots of blood for 9 days. Also having emesis (no blood). Went to oncologist who got a CT scan and referred to pulmonologist. Able to keep down fluids, but not food.

## 2022-03-16 ENCOUNTER — Emergency Department (HOSPITAL_BASED_OUTPATIENT_CLINIC_OR_DEPARTMENT_OTHER)
Admission: EM | Admit: 2022-03-16 | Discharge: 2022-03-17 | Disposition: A | Discharge status: Home / Self Care | Payer: Commercial Managed Care - HMO | Attending: Emergency Medicine | Admitting: Emergency Medicine

## 2022-03-16 ENCOUNTER — Emergency Department (HOSPITAL_BASED_OUTPATIENT_CLINIC_OR_DEPARTMENT_OTHER): Payer: Commercial Managed Care - HMO

## 2022-03-16 ENCOUNTER — Other Ambulatory Visit: Payer: Self-pay

## 2022-03-16 ENCOUNTER — Encounter (HOSPITAL_BASED_OUTPATIENT_CLINIC_OR_DEPARTMENT_OTHER): Payer: Self-pay | Admitting: Emergency Medicine

## 2022-03-16 DIAGNOSIS — R2241 Localized swelling, mass and lump, right lower limb: Secondary | ICD-10-CM | POA: Diagnosis not present

## 2022-03-16 DIAGNOSIS — D649 Anemia, unspecified: Secondary | ICD-10-CM | POA: Diagnosis not present

## 2022-03-16 DIAGNOSIS — R000 Tachycardia, unspecified: Secondary | ICD-10-CM | POA: Diagnosis not present

## 2022-03-16 DIAGNOSIS — I10 Essential (primary) hypertension: Secondary | ICD-10-CM | POA: Insufficient documentation

## 2022-03-16 DIAGNOSIS — M25561 Pain in right knee: Secondary | ICD-10-CM | POA: Insufficient documentation

## 2022-03-16 DIAGNOSIS — Z79899 Other long term (current) drug therapy: Secondary | ICD-10-CM | POA: Insufficient documentation

## 2022-03-16 DIAGNOSIS — Z85028 Personal history of other malignant neoplasm of stomach: Secondary | ICD-10-CM | POA: Diagnosis not present

## 2022-03-16 LAB — COMPREHENSIVE METABOLIC PANEL WITH GFR
ALT: 8 U/L (ref 0–44)
AST: 17 U/L (ref 15–41)
Albumin: 3.5 g/dL (ref 3.5–5.0)
Alkaline Phosphatase: 43 U/L (ref 38–126)
Anion gap: 6 (ref 5–15)
BUN: 24 mg/dL — ABNORMAL HIGH (ref 6–20)
CO2: 26 mmol/L (ref 22–32)
Calcium: 8.7 mg/dL — ABNORMAL LOW (ref 8.9–10.3)
Chloride: 105 mmol/L (ref 98–111)
Creatinine, Ser: 0.96 mg/dL (ref 0.44–1.00)
GFR, Estimated: >60 mL/min (ref >60)
Glucose, Bld: 76 mg/dL (ref 70–99)
Potassium: 4.2 mmol/L (ref 3.5–5.1)
Sodium: 137 mmol/L (ref 135–145)
Total Bilirubin: 0.4 mg/dL (ref 0.3–1.2)
Total Protein: 6.3 g/dL — ABNORMAL LOW (ref 6.5–8.1)

## 2022-03-16 LAB — CBC WITH DIFFERENTIAL/PLATELET
Abs Immature Granulocytes: 0.09 K/uL — ABNORMAL HIGH (ref 0.00–0.07)
Basophils Absolute: 0 K/uL (ref 0.0–0.1)
Basophils Relative: 0 %
Eosinophils Absolute: 0 K/uL (ref 0.0–0.5)
Eosinophils Relative: 0 %
HCT: 31.6 % — ABNORMAL LOW (ref 36.0–46.0)
Hemoglobin: 10.3 g/dL — ABNORMAL LOW (ref 12.0–15.0)
Immature Granulocytes: 1 %
Lymphocytes Relative: 10 %
Lymphs Abs: 1.6 K/uL (ref 0.7–4.0)
MCH: 32.3 pg (ref 26.0–34.0)
MCHC: 32.6 g/dL (ref 30.0–36.0)
MCV: 99.1 fL (ref 80.0–100.0)
Monocytes Absolute: 1 K/uL (ref 0.1–1.0)
Monocytes Relative: 6 %
Neutro Abs: 13.2 K/uL — ABNORMAL HIGH (ref 1.7–7.7)
Neutrophils Relative %: 83 %
Platelets: 301 K/uL (ref 150–400)
RBC: 3.19 MIL/uL — ABNORMAL LOW (ref 3.87–5.11)
RDW: 14.9 % (ref 11.5–15.5)
WBC: 15.8 K/uL — ABNORMAL HIGH (ref 4.0–10.5)
nRBC: 0 % (ref 0.0–0.2)

## 2022-03-16 LAB — SEDIMENTATION RATE: Sed Rate: 10 mm/hr (ref 0–22)

## 2022-03-16 MED ORDER — LORAZEPAM 1 MG PO TABS
1.0000 mg | ORAL_TABLET | Freq: Once | ORAL | Status: AC
Start: 2022-03-16 — End: 2022-03-16
  Administered 2022-03-16: 1 mg via ORAL
  Filled 2022-03-16: qty 1

## 2022-03-16 MED ORDER — ONDANSETRON HCL 4 MG/2ML IJ SOLN
4.0000 mg | Freq: Once | INTRAMUSCULAR | Status: AC
  Administered 2022-03-16: 4 mg via INTRAVENOUS
  Filled 2022-03-16: qty 2

## 2022-03-16 MED ORDER — HYDROCODONE-ACETAMINOPHEN 5-325 MG PO TABS
2.0000 | ORAL_TABLET | Freq: Once | ORAL | Status: AC
  Administered 2022-03-16: 2 via ORAL
  Filled 2022-03-16: qty 2

## 2022-03-16 MED ORDER — LIDOCAINE-EPINEPHRINE 2 %-1:100000 IJ SOLN
10.0000 mL | Freq: Once | INTRAMUSCULAR | Status: DC
Start: 2022-03-16 — End: 2022-03-16
  Filled 2022-03-16: qty 10.2

## 2022-03-16 MED ORDER — MORPHINE SULFATE (PF) 4 MG/ML IV SOLN
4.0000 mg | Freq: Once | INTRAVENOUS | Status: AC
  Administered 2022-03-16: 4 mg via INTRAVENOUS
  Filled 2022-03-16: qty 1

## 2022-03-16 MED ORDER — LIDOCAINE-EPINEPHRINE (PF) 2 %-1:200000 IJ SOLN
20.0000 mL | Freq: Once | INTRAMUSCULAR | Status: AC
  Administered 2022-03-16: 20 mL
  Filled 2022-03-16: qty 20

## 2022-03-16 NOTE — ED Provider Notes (Incomplete)
[place] EMERGENCY DEPARTMENT Provider Note   CSN: [id number] Arrival date & time: 03/16/22  1829     History {Add pertinent medical, surgical, social history, OB history to HPI:1} Chief Complaint  Patient presents with  . Knee Pain  . Joint Swelling    Kristina Graves is a 46 y.o. female.  46 year old female with past medical history significant for sarcoidosis, adenocarcinoma of the stomach presents today for evaluation of right knee pain, swelling ongoing since Sunday morning and has progressively worsened.  She denies fever, chills.  Denies having flareup of sarcoidosis in the past involving her knee.  She states when she was about 46 years old she had a flareup in her right wrist.  She denies any trauma to her knee.  She recently had flareup of her sarcoidosis from pulmonary standpoint and has seen pulmonology.  She was treated with steroid Dosepak and albuterol inhaler.  Reports resolution of the hemoptysis.  Did have CT angio done without evidence of PE or other concerning finding.  The history is provided by the patient. No language interpreter was used.  Knee Pain      Home Medications Prior to Admission medications   Medication Sig Start Date End Date Taking? Authorizing Provider  acetaminophen (TYLENOL) 325 MG tablet Take 650 mg by mouth every 6 (six) hours as needed for moderate pain.    [provider]  albuterol (VENTOLIN HFA) 108 (90 Base) MCG/ACT inhaler Inhale 1-2 puffs into the lungs every 6 (six) hours as needed for wheezing or shortness of breath. 02/07/22   Aberman, Druscilla Brownie, PA-C  atenolol (TENORMIN) 50 MG tablet Take 50 mg by mouth daily.    [provider]  benzonatate (TESSALON) 100 MG capsule Take 1 capsule (100 mg total) by mouth every 8 (eight) hours. 02/07/22   Suzy Bouchard, PA-C  cephALEXin (KEFLEX) 500 MG capsule Take 1 capsule (500 mg total) by mouth 3 (three) times daily. 03/22/21   Rancour, Annie Main, MD   ciprofloxacin (CIPRO) 500 MG tablet Take 1 tablet (500 mg total) by mouth 2 (two) times daily. 02/21/15   Palumbo, April, MD  dicyclomine (BENTYL) 20 MG tablet Take 1 tablet (20 mg total) by mouth 2 (two) times daily. 02/21/15   Harris, Abigail, PA-C  diltiazem (CARTIA XT) 240 MG 24 hr capsule Take 240 mg by mouth daily.    [provider]  ferrous fumarate (HEMOCYTE - 106 MG FE) 325 (106 FE) MG TABS tablet Take 1 tablet by mouth daily.    [provider]  hydrochlorothiazide (HYDRODIURIL) 25 MG tablet Take 25 mg by mouth daily.    [provider]  ibuprofen (ADVIL) 600 MG tablet Take 1 tablet (600 mg total) by mouth every 6 (six) hours as needed. 03/22/21   Rancour, Annie Main, MD  lisinopril (PRINIVIL,ZESTRIL) 40 MG tablet Take 40 mg by mouth daily.    [provider]  metroNIDAZOLE (FLAGYL) 500 MG tablet Take 1 tablet (500 mg total) by mouth 3 (three) times daily. One po bid x 7 days 02/21/15   Palumbo, April, MD  ondansetron (ZOFRAN ODT) 4 MG disintegrating tablet Take 1 tablet (4 mg total) by mouth every 8 (eight) hours as needed for nausea or vomiting. 03/22/21   Rancour, Annie Main, MD  oxyCODONE (ROXICODONE) 15 MG immediate release tablet Take 15 mg by mouth every 4 (four) hours as needed for pain.    [provider]  Pancrelipase, Lip-Prot-Amyl, (CREON) 24000 UNITS CPEP Take 2 capsules by  mouth 3 (three) times daily.    [provider]  pantoprazole (PROTONIX) 40 MG tablet Take 40 mg by mouth daily.    [provider]  promethazine (PHENERGAN) 25 MG tablet Take 1 tablet (25 mg total) by mouth every 6 (six) hours as needed for nausea or vomiting. 02/21/15   Margarita Mail, PA-C  promethazine (PHENERGAN) 25 MG tablet Take 1 tablet (25 mg total) by mouth every 6 (six) hours as needed for nausea or vomiting. 02/07/22   Suzy Bouchard, PA-C  traMADol (ULTRAM) 50 MG tablet Take 1 tablet (50 mg total) by mouth every 6 (six) hours as needed. 02/21/15    Margarita Mail, PA-C      Allergies    Tape    Review of Systems   Review of Systems  Physical Exam Updated Vital Signs BP (!) 155/115   Pulse 78   Temp 98.7 F (37.1 C) (Oral)   Resp 19   Ht 5' 1"  (1.549 m)   Wt 58.1 kg   LMP 03/09/2022   SpO2 100%   BMI 24.19 kg/m  Physical Exam  ED Results / Procedures / Treatments   Labs (all labs ordered are listed, but only abnormal results are displayed) Labs Reviewed  CBC WITH DIFFERENTIAL/PLATELET  COMPREHENSIVE METABOLIC PANEL  SEDIMENTATION RATE    EKG None  Radiology DG Knee Complete 4 Views Right  Result Date: 03/16/2022 CLINICAL DATA:  [id number] Knee pain 125018 EXAM: RIGHT KNEE - COMPLETE 4+ VIEW COMPARISON:  None Available. FINDINGS: No acute fracture or dislocation. There is a mottled appearance of the bone marrow of the distal femur and proximal tibia. It demonstrates a mixed lytic and sclerotic appearance. Joint alignment is maintained. Likely small knee joint effusion. IMPRESSION: 1. No acute fracture or dislocation. 2. Mottled appearance of the bone marrow of the distal femur and proximal tibia. This is nonspecific. Differential considerations include bone infarct, sequela of prior trauma, or underlying osseous lesion (including sequela of sarcoidosis). Recommend comparison with any available remote outside priors Electronically Signed   By: Valentino Saxon M.D.   On: 03/16/2022 19:17    Procedures Procedures  {Document cardiac monitor, telemetry assessment procedure when appropriate:1}  Medications Ordered in ED Medications  HYDROcodone-acetaminophen (NORCO/VICODIN) 5-325 MG per tablet 2 tablet (2 tablets Oral Given 03/16/22 2103)  LORazepam (ATIVAN) tablet 1 mg (1 mg Oral Given 03/16/22 2103)    ED Course/ Medical Decision Making/ A&P                           Medical Decision Making Amount and/or Complexity of Data Reviewed Labs: ordered. Radiology: ordered.  Risk Prescription drug  management.   Medical Decision Making / ED Course   This patient presents to the ED for concern of right knee pain and swelling, this involves an extensive number of treatment options, and is a complaint that carries with it a high risk of complications and morbidity.  The differential diagnosis includes sarcoid flareup, arthritis, gout, septic arthritis, fracture  MDM: 46 year old female with history as noted above presents with progressively worsening knee effusion and pain in the right knee.  X-ray without evidence of acute injury.  Does show some sarcoid changes to distal femur, proximal tibia.  Limitations to range of motion on physical exam.  Difficulty ambulating secondary to pain.  Leukocytosis of about 16,000.  Potentially secondary to recent prednisone that she took beginning of September however unable to rule out septic arthritis.  Shared decision making had with patient who would like to proceed with arthrocentesis.  Arthrocentesis performed.  About 10 mL of bloody fluid obtained.  We will send this off for analysis.  Patient within my shift is awaiting these results.  Her DVT study is negative.  She states that she had history of PE, DVT.  Not on anticoagulation.  She also states she has history of hypertension and has not been on her lisinopril for the past couple months.  She is requesting a refill on this.  She does not have PCP.  Will provide information for [facility] for her to establish care with.  We will also provide her with sports medicine follow-up.  Patient signed out to Dr. Harlow Mares to follow-up on the fluid analysis.  I will send in the lisinopril, prednisone Dosepak.   Additional history obtained: -Additional history obtained from previous emergency room visits most recently for hemoptysis where she had CT angio done without evidence of PE or other acute concerns. -External records from outside source obtained and reviewed including: Chart review  including previous notes, labs, imaging, consultation notes   Lab Tests: -I ordered, reviewed, and interpreted labs.   The pertinent results include:   Labs Reviewed  CBC WITH DIFFERENTIAL/PLATELET - Abnormal; Notable for the following components:      Result Value   WBC 15.8 (*)    RBC 3.19 (*)    Hemoglobin 10.3 (*)    HCT 31.6 (*)    Neutro Abs 13.2 (*)    Abs Immature Granulocytes 0.09 (*)    All other components within normal limits  COMPREHENSIVE METABOLIC PANEL - Abnormal; Notable for the following components:   BUN 24 (*)    Calcium 8.7 (*)    Total Protein 6.3 (*)    All other components within normal limits  BODY FLUID CULTURE W GRAM STAIN  SEDIMENTATION RATE  GLUCOSE, BODY FLUID OTHER            PROTEIN, BODY FLUID (OTHER)  SYNOVIAL CELL COUNT + DIFF, W/ CRYSTALS  URIC ACID, BODY FLUID      EKG  EKG Interpretation  Date/Time:    Ventricular Rate:    PR Interval:    QRS Duration:   QT Interval:    QTC Calculation:   R Axis:     Text Interpretation:           Imaging Studies ordered: I ordered imaging studies including right knee x-ray I independently visualized and interpreted imaging. I agree with the radiologist interpretation   Medicines ordered and prescription drug management: Meds ordered this encounter  Medications  . HYDROcodone-acetaminophen (NORCO/VICODIN) 5-325 MG per tablet 2 tablet  . LORazepam (ATIVAN) tablet 1 mg  . DISCONTD: lidocaine-EPINEPHrine (XYLOCAINE W/EPI) 2 %-1:100000 (with pres) injection 10 mL  . morphine (PF) 4 MG/ML injection 4 mg  . ondansetron (ZOFRAN) injection 4 mg  . lidocaine-EPINEPHrine (XYLOCAINE W/EPI) 2 %-1:200000 (PF) injection 20 mL    -I have reviewed the patients home medicines and have made adjustments as needed  Reevaluation: After the interventions noted above, I reevaluated the patient and found that they have :improved  Co morbidities that complicate the patient evaluation . Past Medical  History:  Diagnosis Date  . Cancer ([place])    stomach   . Crohn disease ([place])   . Hypertension   . ITP (idiopathic thrombocytopenic purpura)   . Sarcoidosis       Dispostion: Patient at the end of my  shift signed out to Dr. Mikael Spray for follow-up on fluid analysis.  Final Clinical Impression(s) / ED Diagnoses Final diagnoses:  None    Rx / DC Orders ED Discharge Orders     None

## 2022-03-16 NOTE — ED Provider Notes (Signed)
Care assumed from [facility], patient with knee effusion status post arthrocentesis pending results of fluid studies.  Patient expressed desire to leave before results of arthrocentesis came back.  I have explained to him the risk of septic arthritis including the risk of total joint destruction, but patient still wishes to leave.  Clinically, this is not a septic joint, so I have not started antibiotics.  I have recommended ice and elevation.  Use crutches as needed.  I have referred him to sports medicine for follow-up.   Delora Fuel, MD 92/95/74 [phone]

## 2022-03-16 NOTE — ED Triage Notes (Signed)
Pt reports sarcoidosis flare up in right knee that started Sunday. Swelling and pain present in right knee. Denies injury. Pt also requesting something for her anxiety. States she feels very overwhelmed with life right now. Pt does not have PCP. Pt ambulatory with steady gait.

## 2022-03-16 NOTE — ED Notes (Signed)
Pt awake and alert- GCS 15.  Reports ongoing R knee pain edema with pain radiating to R calf - pt able to bear weight however ambulates with limp per charge nurse who escorted pt from ED lobby.  Pt reports h/o sarcoidosis and believes this is related to sarcoid flare.  RLE distal neurovascular status intact.  RR even and unlabored on RA with symmetrical rise and fall of chest- O2 sats 100% on RA however pt reports mild sob.  Continuous pulse ox and cardiac monitoring maintained.  Healed mid abd surgical scar noted otherwise skin warm dry and intact.  Pt very tearful in room when speaking with provider with this nurse at bedside-- PO Ativan for anxiety and PO Norco for pain have been administered (see MAR)  - pt now awaits lab results.  Will monitor for acute changes and maintain plan of care.

## 2022-03-16 NOTE — ED Provider Notes (Signed)
[facility] EMERGENCY DEPARTMENT Provider Note   CSN: [id number] Arrival date & time: 03/16/22  1829     History  Chief Complaint  Patient presents with   Knee Pain   Joint Swelling    Kristina Graves is a 46 y.o. female.  46 year old female with past medical history significant for sarcoidosis, adenocarcinoma of the stomach presents today for evaluation of right knee pain, swelling ongoing since Sunday morning and has progressively worsened.  She denies fever, chills.  Denies having flareup of sarcoidosis in the past involving her knee.  She states when she was about 46 years old she had a flareup in her right wrist.  She denies any trauma to her knee.  She recently had flareup of her sarcoidosis from pulmonary standpoint and has seen pulmonology.  She was treated with steroid Dosepak and albuterol inhaler.  Reports resolution of the hemoptysis.  Did have CT angio done without evidence of PE or other concerning finding.  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  Has pain to her right calf.  Denies chest pain, shortness of breath, or hemoptysis at this time.  The history is provided by the patient. No language interpreter was used.       Home Medications Prior to Admission medications   Medication Sig Start Date End Date Taking? Authorizing Provider  acetaminophen (TYLENOL) 325 MG tablet Take 650 mg by mouth every 6 (six) hours as needed for moderate pain.    [provider]  albuterol (VENTOLIN HFA) 108 (90 Base) MCG/ACT inhaler Inhale 1-2 puffs into the lungs every 6 (six) hours as needed for wheezing or shortness of breath. 02/07/22   Aberman, Druscilla Brownie, PA-C  atenolol (TENORMIN) 50 MG tablet Take 50 mg by mouth daily.    [provider]  benzonatate (TESSALON) 100 MG capsule Take 1 capsule (100 mg total) by mouth every 8 (eight) hours. 02/07/22   Suzy Bouchard, PA-C  cephALEXin (KEFLEX) 500 MG capsule Take 1 capsule (500 mg total) by mouth 3  (three) times daily. 03/22/21   Rancour, Annie Main, MD  ciprofloxacin (CIPRO) 500 MG tablet Take 1 tablet (500 mg total) by mouth 2 (two) times daily. 02/21/15   Palumbo, April, MD  dicyclomine (BENTYL) 20 MG tablet Take 1 tablet (20 mg total) by mouth 2 (two) times daily. 02/21/15   Harris, Abigail, PA-C  diltiazem (CARTIA XT) 240 MG 24 hr capsule Take 240 mg by mouth daily.    [provider]  ferrous fumarate (HEMOCYTE - 106 MG FE) 325 (106 FE) MG TABS tablet Take 1 tablet by mouth daily.    [provider]  hydrochlorothiazide (HYDRODIURIL) 25 MG tablet Take 25 mg by mouth daily.    [provider]  ibuprofen (ADVIL) 600 MG tablet Take 1 tablet (600 mg total) by mouth every 6 (six) hours as needed. 03/22/21   Rancour, Annie Main, MD  lisinopril (PRINIVIL,ZESTRIL) 40 MG tablet Take 40 mg by mouth daily.    [provider]  metroNIDAZOLE (FLAGYL) 500 MG tablet Take 1 tablet (500 mg total) by mouth 3 (three) times daily. One po bid x 7 days 02/21/15   Palumbo, April, MD  ondansetron (ZOFRAN ODT) 4 MG disintegrating tablet Take 1 tablet (4 mg total) by mouth every 8 (eight) hours as needed for nausea or vomiting. 03/22/21   Rancour, Annie Main, MD  oxyCODONE (ROXICODONE) 15 MG immediate release tablet Take 15 mg by mouth every 4 (four) hours as needed for pain.  [provider]  Pancrelipase, Lip-Prot-Amyl, (CREON) 24000 UNITS CPEP Take 2 capsules by mouth 3 (three) times daily.    [provider]  pantoprazole (PROTONIX) 40 MG tablet Take 40 mg by mouth daily.    [provider]  promethazine (PHENERGAN) 25 MG tablet Take 1 tablet (25 mg total) by mouth every 6 (six) hours as needed for nausea or vomiting. 02/21/15   Margarita Mail, PA-C  promethazine (PHENERGAN) 25 MG tablet Take 1 tablet (25 mg total) by mouth every 6 (six) hours as needed for nausea or vomiting. 02/07/22   Suzy Bouchard, PA-C  traMADol (ULTRAM) 50 MG tablet Take 1 tablet (50 mg  total) by mouth every 6 (six) hours as needed. 02/21/15   Margarita Mail, PA-C      Allergies    Tape    Review of Systems   Review of Systems  Respiratory:  Negative for shortness of breath.   Cardiovascular:  Negative for chest pain.  Musculoskeletal:  Positive for arthralgias and joint swelling.  All other systems reviewed and are negative.   Physical Exam Updated Vital Signs BP (!) 155/115   Pulse 78   Temp 98.7 F (37.1 C) (Oral)   Resp 19   Ht 5' 1"  (1.549 m)   Wt 58.1 kg   LMP 03/09/2022   SpO2 100%   BMI 24.19 kg/m  Physical Exam Vitals and nursing note reviewed.  Constitutional:      General: She is not in acute distress.    Appearance: Normal appearance. She is not ill-appearing.  HENT:     Head: Normocephalic and atraumatic.     Nose: Nose normal.  Eyes:     Conjunctiva/sclera: Conjunctivae normal.  Cardiovascular:     Rate and Rhythm: Regular rhythm. Tachycardia present.     Pulses: Normal pulses.  Pulmonary:     Effort: Pulmonary effort is normal. No respiratory distress.     Breath sounds: No wheezing or rales.  Musculoskeletal:        General: Swelling and tenderness present. No deformity.     Right lower leg: No edema.     Left lower leg: No edema.     Comments: Right knee with visible swelling.  Tenderness to palpation present.  Range of motion limited secondary to pain.  2+ DP pulse present bilaterally.  Pain present to right calf.  No swelling to right lower extremity.  Warmth noted to right knee, without overlying erythema.  Skin:    Findings: No rash.  Neurological:     Mental Status: She is alert.     ED Results / Procedures / Treatments   Labs (all labs ordered are listed, but only abnormal results are displayed) Labs Reviewed  CBC WITH DIFFERENTIAL/PLATELET  COMPREHENSIVE METABOLIC PANEL  SEDIMENTATION RATE    EKG None  Radiology DG Knee Complete 4 Views Right  Result Date: 03/16/2022 CLINICAL DATA:  878676 Knee pain 125018  EXAM: RIGHT KNEE - COMPLETE 4+ VIEW COMPARISON:  None Available. FINDINGS: No acute fracture or dislocation. There is a mottled appearance of the bone marrow of the distal femur and proximal tibia. It demonstrates a mixed lytic and sclerotic appearance. Joint alignment is maintained. Likely small knee joint effusion. IMPRESSION: 1. No acute fracture or dislocation. 2. Mottled appearance of the bone marrow of the distal femur and proximal tibia. This is nonspecific. Differential considerations include bone infarct, sequela of prior trauma, or underlying osseous lesion (including sequela of sarcoidosis). Recommend comparison with any available  remote outside priors Electronically Signed   By: Valentino Saxon M.D.   On: 03/16/2022 19:17    Procedures .Joint Aspiration/Arthrocentesis  Date/Time: 03/17/2022 12:09 AM  Performed by: Evlyn Courier, PA-C Authorized by: Evlyn Courier, PA-C   Consent:    Consent obtained:  Verbal   Consent given by:  Patient   Risks, benefits, and alternatives were discussed: yes     Risks discussed:  Bleeding, infection and incomplete drainage   Alternatives discussed:  No treatment, alternative treatment, observation and referral Universal protocol:    Procedure explained and questions answered to patient or proxy's satisfaction: yes     Relevant documents present and verified: yes     Test results available: yes     Site/side marked: yes     Patient identity confirmed:  Verbally with patient and arm band Location:    Location:  Knee   Knee:  R knee Anesthesia:    Anesthesia method:  Local infiltration   Local anesthetic:  Lidocaine 2% WITH epi Procedure details:    Needle gauge:  22 G   Approach:  Medial   Aspirate amount:  10m   Aspirate characteristics:  Blood-tinged   Steroid injected: no   Post-procedure details:    Procedure completion:  Tolerated well, no immediate complications     Medications Ordered in ED Medications  HYDROcodone-acetaminophen  (NORCO/VICODIN) 5-325 MG per tablet 2 tablet (2 tablets Oral Given 03/16/22 2103)  LORazepam (ATIVAN) tablet 1 mg (1 mg Oral Given 03/16/22 2103)    ED Course/ Medical Decision Making/ A&P                           Medical Decision Making Amount and/or Complexity of Data Reviewed Labs: ordered. Radiology: ordered.  Risk Prescription drug management.   Medical Decision Making / ED Course   This patient presents to the ED for concern of right knee pain and swelling, this involves an extensive number of treatment options, and is a complaint that carries with it a high risk of complications and morbidity.  The differential diagnosis includes sarcoid flareup, arthritis, gout, septic arthritis, fracture  MDM: 46year old female with history as noted above presents with progressively worsening knee effusion and pain in the right knee.  X-ray without evidence of acute injury.  Does show some sarcoid changes to distal femur, proximal tibia.  Limitations to range of motion on physical exam.  Difficulty ambulating secondary to pain.  Leukocytosis of about 16,000.  Potentially secondary to recent prednisone that she took beginning of September however unable to rule out septic arthritis.  Shared decision making had with patient who would like to proceed with arthrocentesis.  Arthrocentesis performed.  About 10 mL of bloody fluid obtained.  We will send this off for analysis.  Patient within my shift is awaiting these results.  Her DVT study is negative.  She states that she had history of PE, DVT.  Not on anticoagulation.  She also states she has history of hypertension and has not been on her lisinopril for the past couple months.  She is requesting a refill on this.  She does not have PCP.  Will provide information for [facility] internal medicine clinic for her to establish care with.  We will also provide her with sports medicine follow-up.  Patient signed out to Dr. CHarlow Maresto follow-up on the fluid  analysis.  I will send in the lisinopril, prednisone Dosepak.   Additional history obtained: -  Additional history obtained from previous emergency room visits most recently for hemoptysis where she had CT angio done without evidence of PE or other acute concerns. -External records from outside source obtained and reviewed including: Chart review including previous notes, labs, imaging, consultation notes   Lab Tests: -I ordered, reviewed, and interpreted labs.   The pertinent results include:   Labs Reviewed  CBC WITH DIFFERENTIAL/PLATELET - Abnormal; Notable for the following components:      Result Value   WBC 15.8 (*)    RBC 3.19 (*)    Hemoglobin 10.3 (*)    HCT 31.6 (*)    Neutro Abs 13.2 (*)    Abs Immature Granulocytes 0.09 (*)    All other components within normal limits  COMPREHENSIVE METABOLIC PANEL - Abnormal; Notable for the following components:   BUN 24 (*)    Calcium 8.7 (*)    Total Protein 6.3 (*)    All other components within normal limits  BODY FLUID CULTURE W GRAM STAIN  SEDIMENTATION RATE  GLUCOSE, BODY FLUID OTHER            PROTEIN, BODY FLUID (OTHER)  SYNOVIAL CELL COUNT + DIFF, W/ CRYSTALS  URIC ACID, BODY FLUID      EKG  EKG Interpretation  Date/Time:    Ventricular Rate:    PR Interval:    QRS Duration:   QT Interval:    QTC Calculation:   R Axis:     Text Interpretation:           Imaging Studies ordered: I ordered imaging studies including right knee x-ray I independently visualized and interpreted imaging. I agree with the radiologist interpretation   Medicines ordered and prescription drug management: Meds ordered this encounter  Medications   HYDROcodone-acetaminophen (NORCO/VICODIN) 5-325 MG per tablet 2 tablet   LORazepam (ATIVAN) tablet 1 mg   DISCONTD: lidocaine-EPINEPHrine (XYLOCAINE W/EPI) 2 %-1:100000 (with pres) injection 10 mL   morphine (PF) 4 MG/ML injection 4 mg   ondansetron (ZOFRAN) injection 4 mg    lidocaine-EPINEPHrine (XYLOCAINE W/EPI) 2 %-1:200000 (PF) injection 20 mL    -I have reviewed the patients home medicines and have made adjustments as needed  Reevaluation: After the interventions noted above, I reevaluated the patient and found that they have :improved  Co morbidities that complicate the patient evaluation  Past Medical History:  Diagnosis Date   Cancer ([place])    stomach    Crohn disease ([place])    Hypertension    ITP (idiopathic thrombocytopenic purpura)    Sarcoidosis       Dispostion: Patient at the end of my shift signed out to Dr. Mikael Spray for follow-up on fluid analysis.  Final Clinical Impression(s) / ED Diagnoses Final diagnoses:  Acute pain of right knee    Rx / DC Orders ED Discharge Orders          Ordered    lisinopril (ZESTRIL) 10 MG tablet  Daily        03/17/22 0005    hydrOXYzine (ATARAX) 25 MG tablet  Every 8 hours PRN        03/17/22 0005    predniSONE (STERAPRED UNI-PAK 21 TAB) 10 MG (21) TBPK tablet  Daily        03/17/22 0005              Evlyn Courier, PA-C 03/17/22 0010    Gareth Morgan, MD 03/17/22 1134

## 2022-03-17 LAB — SYNOVIAL CELL COUNT + DIFF, W/ CRYSTALS
Crystals, Fluid: NONE SEEN
Eosinophils-Synovial: 1 % (ref 0–1)
Lymphocytes-Synovial Fld: 5 % (ref 0–20)
Monocyte-Macrophage-Synovial Fluid: 13 % — ABNORMAL LOW (ref 50–90)
Neutrophil, Synovial: 81 % — ABNORMAL HIGH (ref 0–25)
WBC, Synovial: 2022 /mm3 — ABNORMAL HIGH (ref 0–200)

## 2022-03-17 MED ORDER — LISINOPRIL 10 MG PO TABS: 10.0000 mg | ORAL_TABLET | Freq: Every day | ORAL | 0 refills | Status: DC

## 2022-03-17 MED ORDER — PREDNISONE 50 MG PO TABS
60.0000 mg | ORAL_TABLET | Freq: Once | ORAL | Status: AC
  Administered 2022-03-17: 60 mg via ORAL
  Filled 2022-03-17: qty 1

## 2022-03-17 MED ORDER — PREDNISONE 10 MG (21) PO TBPK: ORAL_TABLET | Freq: Every day | ORAL | 0 refills | Status: DC

## 2022-03-17 MED ORDER — HEPARIN SOD (PORK) LOCK FLUSH 100 UNIT/ML IV SOLN
500.0000 [IU] | Freq: Once | INTRAVENOUS | Status: AC
  Administered 2022-03-17: 500 [IU]
  Filled 2022-03-17: qty 5

## 2022-03-17 MED ORDER — HYDROXYZINE HCL 25 MG PO TABS: 25.0000 mg | ORAL_TABLET | Freq: Three times a day (TID) | ORAL | 0 refills | Status: DC | PRN

## 2022-03-17 NOTE — ED Notes (Signed)
ED Provider at bedside. 

## 2022-03-17 NOTE — Discharge Instructions (Addendum)
You are leaving before the results are back on the fluid on your knee. Please check those results in MyChart. If the glucose is low and the WBD very high (over 50,000), then come in immediately. Those results could indicate infection in your knee. If there is infection in your knee, and it is not treated aggressively, it can destroy your knee.  Your work-up today was overall reassuring.  DVT study was negative.  I have sent 10 prednisone Dosepak for your knee pain which may likely be a result of your sarcoidosis flareup.  He requests a refill for the lisinopril.  I am unsure of the dosing that you are on at that time we will start you with 10 mg dosing.  This can be adjusted once you establish and follow-up with a PCP.  I also sent in hydroxyzine for anxiety at your request.  I have given you follow-up for sports medicine listed above.  For any concerning symptoms such as fever, worsening knee pain please return to the emergency room otherwise follow-up with the sports medicine clinic and establish care with [facility] internal medicine clinic.

## 2022-03-17 NOTE — ED Notes (Signed)
Pt agreeable with d/c plan as discussed by provider - this nurse has verbally reinforced d/c instructions and provided pt with written copy - pt acknowledges verbal understanding and denies any addl' questions concerns needs- escorted to exit via w/c and accompanied home by significant other

## 2022-03-18 ENCOUNTER — Ambulatory Visit (INDEPENDENT_AMBULATORY_CARE_PROVIDER_SITE_OTHER): Payer: Commercial Managed Care - HMO | Admitting: Family Medicine

## 2022-03-18 ENCOUNTER — Encounter: Payer: Self-pay | Admitting: Family Medicine

## 2022-03-18 ENCOUNTER — Ambulatory Visit: Payer: Self-pay

## 2022-03-18 VITALS — BP 151/88 | Ht 61.0 in | Wt 128.0 lb

## 2022-03-18 DIAGNOSIS — D508 Other iron deficiency anemias: Secondary | ICD-10-CM

## 2022-03-18 DIAGNOSIS — I10 Essential (primary) hypertension: Secondary | ICD-10-CM | POA: Insufficient documentation

## 2022-03-18 DIAGNOSIS — D869 Sarcoidosis, unspecified: Secondary | ICD-10-CM | POA: Insufficient documentation

## 2022-03-18 DIAGNOSIS — M1220 Villonodular synovitis (pigmented), unspecified site: Secondary | ICD-10-CM | POA: Insufficient documentation

## 2022-03-18 DIAGNOSIS — D649 Anemia, unspecified: Secondary | ICD-10-CM | POA: Insufficient documentation

## 2022-03-18 DIAGNOSIS — M25561 Pain in right knee: Secondary | ICD-10-CM

## 2022-03-18 HISTORY — DX: Essential (primary) hypertension: I10

## 2022-03-18 MED ORDER — HYDROCODONE-ACETAMINOPHEN 5-325 MG PO TABS: 1.0000 | ORAL_TABLET | Freq: Three times a day (TID) | ORAL | 0 refills | Status: DC | PRN

## 2022-03-18 NOTE — Assessment & Plan Note (Signed)
Uncontrolled. Was recently started on medications.  - CMP

## 2022-03-18 NOTE — Assessment & Plan Note (Signed)
Acutely occurring.  Reports a history of sarcoidosis.  Findings on ultrasound do not demonstrate an inflammatory source at this time. -Counseled on home exercise therapy and supportive care. -Sed rate, CRP and ANA panel.

## 2022-03-18 NOTE — Assessment & Plan Note (Signed)
Acute on chronic in nature.  - pathology smear review

## 2022-03-18 NOTE — Progress Notes (Signed)
  Kristina Graves - 46 y.o. female MRN [id number]  Date of birth: 11/06/1975  SUBJECTIVE:  Including CC & ROS.  No chief complaint on file.   Kristina Graves is a 46 y.o. female that is presenting with acute right knee pain, uncontrolled hypertension and anemia.  The knee pain has acutely gotten painful over the past few days.  She is having limited range of motion.  Reports a history of sarcoidosis and gastric cancer.  No history of knee surgery.  Review of the emergency department note from 10/3 shows she had a hemarthrosis and provided lisinopril, hydroxyzine and prednisone. Independent review of the right lower extremity duplex from 10/3 shows no DVT. Independent review of the right knee x-ray from 10/3 shows mottled appearance of the bone marrow of the distal femur and proximal tibia.  Review of Systems See HPI   HISTORY: Past Medical, Surgical, Social, and Family History Reviewed & Updated per EMR.   Pertinent Historical Findings include:  Past Medical History:  Diagnosis Date   Cancer ([place])    stomach    Crohn disease ([place])    Hypertension    ITP (idiopathic thrombocytopenic purpura)    Sarcoidosis     Past Surgical History:  Procedure Laterality Date   colon reconstruction     OOPHORECTOMY       PHYSICAL EXAM:  VS: BP (!) 151/88 (BP Location: Right Arm, Patient Position: Sitting)   Ht 5' 1"  (1.549 m)   Wt 128 lb (58.1 kg)   LMP 03/09/2022   BMI 24.19 kg/m  Physical Exam Gen: NAD, alert, cooperative with exam, well-appearing MSK:  Right knee: Effusion noted. Limited flexion and extension passively. Decrease strength resistance. Neurovascularly intact    Limited ultrasound: Right knee:  Effusion noted within the suprapatellar pouch. Normal-appearing quadricep and patellar tendon. Thickening of the synovium with increased hyperemia. Normal-appearing medial lateral joint space.  Summary: Findings concerning for synovitis.  Ultrasound and interpretation by  Clearance Coots, MD    ASSESSMENT & PLAN:   Essential hypertension Uncontrolled. Was recently started on medications.  - CMP    PVNS (pigmented villonodular synovitis) Acutely occurring.  Having mechanical symptoms on exam today.  Reports a history of gastric cancer as well as sarcoidosis.  Significant abnormalities observed on ultrasound today with mechanical symptoms.  X-ray was not revealing for a source. -Counseled on home exercise therapy and supportive care. - norco -MRI of the right knee to evaluate for pigmented bilin nodular synovitis and for presurgical planning.  Sarcoidosis Acutely occurring.  Reports a history of sarcoidosis.  Findings on ultrasound do not demonstrate an inflammatory source at this time. -Counseled on home exercise therapy and supportive care. -Sed rate, CRP and ANA panel.  Absolute anemia Acute on chronic in nature.  - pathology smear review

## 2022-03-18 NOTE — Assessment & Plan Note (Addendum)
Acutely occurring.  Having mechanical symptoms on exam today.  Reports a history of gastric cancer as well as sarcoidosis.  Significant abnormalities observed on ultrasound today with mechanical symptoms.  X-ray was not revealing for a source. -Counseled on home exercise therapy and supportive care. - norco -MRI of the right knee to evaluate for pigmented bilin nodular synovitis and for presurgical planning.

## 2022-03-19 ENCOUNTER — Other Ambulatory Visit: Payer: Self-pay | Admitting: Family Medicine

## 2022-03-20 LAB — BODY FLUID CULTURE W GRAM STAIN: Culture: NO GROWTH

## 2022-03-20 LAB — COMPREHENSIVE METABOLIC PANEL WITH GFR
ALT: 9 IU/L (ref 0–32)
AST: 16 IU/L (ref 0–40)
Albumin/Globulin Ratio: 1.8 (ref 1.2–2.2)
Albumin: 4.1 g/dL (ref 3.9–4.9)
Alkaline Phosphatase: 51 IU/L (ref 44–121)
BUN/Creatinine Ratio: 26 — ABNORMAL HIGH (ref 9–23)
BUN: 28 mg/dL — ABNORMAL HIGH (ref 6–24)
Bilirubin Total: 0.3 mg/dL (ref 0.0–1.2)
CO2: 23 mmol/L (ref 20–29)
Calcium: 9.8 mg/dL (ref 8.7–10.2)
Chloride: 104 mmol/L (ref 96–106)
Creatinine, Ser: 1.06 mg/dL — ABNORMAL HIGH (ref 0.57–1.00)
Globulin, Total: 2.3 g/dL (ref 1.5–4.5)
Glucose: 199 mg/dL — ABNORMAL HIGH (ref 70–99)
Potassium: 5 mmol/L (ref 3.5–5.2)
Sodium: 141 mmol/L (ref 134–144)
Total Protein: 6.4 g/dL (ref 6.0–8.5)
eGFR: 66 mL/min/1.73

## 2022-03-20 LAB — ANA,IFA RA DIAG PNL W/RFLX TIT/PATN
ANA Titer 1: NEGATIVE
Cyclic Citrullin Peptide Ab: 3 units (ref 0–19)
Rheumatoid fact SerPl-aCnc: <10 IU/mL (ref <14.0)

## 2022-03-20 LAB — C-REACTIVE PROTEIN: CRP: 3 mg/L (ref 0–10)

## 2022-03-20 LAB — SEDIMENTATION RATE: Sed Rate: 21 mm/hr (ref 0–32)

## 2022-03-22 ENCOUNTER — Ambulatory Visit (INDEPENDENT_AMBULATORY_CARE_PROVIDER_SITE_OTHER): Payer: Commercial Managed Care - HMO | Admitting: Family Medicine

## 2022-03-22 ENCOUNTER — Encounter: Payer: Self-pay | Admitting: Family Medicine

## 2022-03-22 ENCOUNTER — Other Ambulatory Visit: Payer: Self-pay | Admitting: Family Medicine

## 2022-03-22 ENCOUNTER — Telehealth: Payer: Self-pay | Admitting: Family Medicine

## 2022-03-22 DIAGNOSIS — D508 Other iron deficiency anemias: Secondary | ICD-10-CM

## 2022-03-22 LAB — PATHOLOGIST SMEAR REVIEW
Basophils Absolute: 0 10*3/uL (ref 0.0–0.2)
Basos: 0 %
EOS (ABSOLUTE): 0 10*3/uL (ref 0.0–0.4)
Eos: 0 %
Hematocrit: 32.6 % — ABNORMAL LOW (ref 34.0–46.6)
Hemoglobin: 10.8 g/dL — ABNORMAL LOW (ref 11.1–15.9)
Immature Grans (Abs): 0.1 10*3/uL (ref 0.0–0.1)
Immature Granulocytes: 1 %
Lymphocytes Absolute: 0.4 10*3/uL — ABNORMAL LOW (ref 0.7–3.1)
Lymphs: 2 %
MCH: 32.5 pg (ref 26.6–33.0)
MCHC: 33.1 g/dL (ref 31.5–35.7)
MCV: 98 fL — ABNORMAL HIGH (ref 79–97)
Monocytes Absolute: 0.4 10*3/uL (ref 0.1–0.9)
Monocytes: 2 %
Neutrophils Absolute: 17.6 10*3/uL — ABNORMAL HIGH (ref 1.4–7.0)
Neutrophils: 95 %
Platelets: 325 10*3/uL (ref 150–450)
RBC: 3.32 x10E6/uL — ABNORMAL LOW (ref 3.77–5.28)
RDW: 13.5 % (ref 11.7–15.4)
WBC: 18.5 10*3/uL — ABNORMAL HIGH (ref 3.4–10.8)

## 2022-03-22 NOTE — Telephone Encounter (Signed)
Pt informed of below.  

## 2022-03-22 NOTE — Telephone Encounter (Signed)
Left VM for patient. If she calls back please have her speak with a nurse/CMA and inform that we need to check some further lab work.  We need to check her iron and ferritin and her B12 and folate based on her lab results.  This was showing a continued anemia..   If any questions then please take the best time and phone number to call and I will try to call her back.   Rosemarie Ax, MD [facility] 03/22/2022, 1:27 PM

## 2022-03-22 NOTE — Assessment & Plan Note (Signed)
Labwork was revealing for ongoing anemia.  Reports that she has had iron infusions in the past.  She feels fatigued currently. -Counseled on supportive care. -B12 folate and iron and ferritin

## 2022-03-22 NOTE — Progress Notes (Signed)
  Kristina Graves - 46 y.o. female MRN [id number]  Date of birth: [birth year]  SUBJECTIVE:  Including CC & ROS.  No chief complaint on file.   Kristina Graves is a 46 y.o. female that is following up for her knee pain and lab work.  Continues to have knee pain and is scheduled for the MRI.  Her lab work was demonstrating an ongoing anemia that suggests possible deficiencies.   Review of Systems See HPI   HISTORY: Past Medical, Surgical, Social, and Family History Reviewed & Updated per EMR.   Pertinent Historical Findings include:  Past Medical History:  Diagnosis Date   Cancer ([place])    stomach    Crohn disease ([place])    Hypertension    ITP (idiopathic thrombocytopenic purpura)    Sarcoidosis     Past Surgical History:  Procedure Laterality Date   colon reconstruction     OOPHORECTOMY       PHYSICAL EXAM:  VS: BP 130/88 (BP Location: Left Arm, Patient Position: Sitting)   Ht 5' 1"  (1.549 m)   Wt 128 lb (58.1 kg)   LMP 03/09/2022   BMI 24.19 kg/m  Physical Exam Gen: NAD, alert, cooperative with exam, well-appearing MSK:  Neurovascularly intact       ASSESSMENT & PLAN:   Absolute anemia Labwork was revealing for ongoing anemia.  Reports that she has had iron infusions in the past.  She feels fatigued currently. -Counseled on supportive care. -B12 folate and iron and ferritin

## 2022-03-23 ENCOUNTER — Encounter: Payer: Self-pay | Admitting: Student

## 2022-03-23 ENCOUNTER — Telehealth (INDEPENDENT_AMBULATORY_CARE_PROVIDER_SITE_OTHER): Payer: Commercial Managed Care - HMO | Admitting: Family Medicine

## 2022-03-23 ENCOUNTER — Ambulatory Visit (INDEPENDENT_AMBULATORY_CARE_PROVIDER_SITE_OTHER): Payer: Commercial Managed Care - HMO | Admitting: Student

## 2022-03-23 ENCOUNTER — Ambulatory Visit
Admission: RE | Admit: 2022-03-23 | Discharge: 2022-03-23 | Disposition: A | Discharge status: Home / Self Care | Payer: Commercial Managed Care - HMO | Source: Ambulatory Visit | Attending: Family Medicine | Admitting: Family Medicine

## 2022-03-23 VITALS — BP 120/71 | HR 90 | Temp 97.5°F | Ht 61.5 in | Wt 125.3 lb

## 2022-03-23 DIAGNOSIS — M1220 Villonodular synovitis (pigmented), unspecified site: Secondary | ICD-10-CM | POA: Diagnosis not present

## 2022-03-23 DIAGNOSIS — F321 Major depressive disorder, single episode, moderate: Secondary | ICD-10-CM | POA: Diagnosis not present

## 2022-03-23 DIAGNOSIS — K5090 Crohn's disease, unspecified, without complications: Secondary | ICD-10-CM | POA: Insufficient documentation

## 2022-03-23 DIAGNOSIS — D869 Sarcoidosis, unspecified: Secondary | ICD-10-CM | POA: Diagnosis not present

## 2022-03-23 DIAGNOSIS — Z85028 Personal history of other malignant neoplasm of stomach: Secondary | ICD-10-CM

## 2022-03-23 DIAGNOSIS — K50919 Crohn's disease, unspecified, with unspecified complications: Secondary | ICD-10-CM | POA: Diagnosis not present

## 2022-03-23 DIAGNOSIS — F32A Depression, unspecified: Secondary | ICD-10-CM | POA: Insufficient documentation

## 2022-03-23 DIAGNOSIS — F329 Major depressive disorder, single episode, unspecified: Secondary | ICD-10-CM | POA: Insufficient documentation

## 2022-03-23 LAB — B12 AND FOLATE PANEL
Folate: >20 ng/mL (ref >3.0)
Vitamin B-12: >2000 pg/mL — ABNORMAL HIGH (ref 232–1245)

## 2022-03-23 LAB — IRON,TIBC AND FERRITIN PANEL
Ferritin: 37 ng/mL (ref 15–150)
Iron Saturation: 23 % (ref 15–55)
Iron: 95 ug/dL (ref 27–159)
Total Iron Binding Capacity: 410 ug/dL (ref 250–450)
UIBC: 315 ug/dL (ref 131–425)

## 2022-03-23 MED ORDER — HYDROCODONE-ACETAMINOPHEN 5-325 MG PO TABS: 1.0000 | ORAL_TABLET | Freq: Three times a day (TID) | ORAL | 0 refills | Status: DC | PRN

## 2022-03-23 NOTE — Progress Notes (Signed)
Virtual Visit via Video Note  I connected with Kristina Graves on 03/23/22 at  1:00 PM EDT by a video enabled telemedicine application and verified that I am speaking with the correct person using two identifiers.  Location: Patient: home Provider: office   I discussed the limitations of evaluation and management by telemedicine and the availability of in person appointments. The patient expressed understanding and agreed to proceed.  History of Present Illness:  Kristina Graves is a 46 year old female is following up after the MRI of her right knee.  This was demonstrating a large joint effusion with an intra-articular soft tissue lesion suggestive of focal synovitis and potentially pigmented villonodular synovitis.  Demonstrating bony infarcts of the distal femur and proximal tibia.   Observations/Objective:   Assessment and Plan:  Pigmented by the nodular synovitis of right knee: MRI was showing concern for soft tissue lesion being a PVNS.  -Counseled on home exercise therapy and supportive care. - refilled norco -Referral to orthopedic surgery.  Follow Up Instructions:    I discussed the assessment and treatment plan with the patient. The patient was provided an opportunity to ask questions and all were answered. The patient agreed with the plan and demonstrated an understanding of the instructions.   The patient was advised to call back or seek an in-person evaluation if the symptoms worsen or if the condition fails to improve as anticipated.    Clearance Coots, MD

## 2022-03-23 NOTE — Progress Notes (Signed)
CC: new patient to establish care  HPI:  [name] is a 46 y.o. female with history listed below presenting to the [facility] as a new patient here to establish care. Please see individualized problem based charting for full HPI.  Past Medical History:  Diagnosis Date   Cancer ([place])    stomach    Crohn disease ([place])    PVNS (pigmented villonodular synovitis)    Sarcoidosis    Past Surgical History:  Procedure Laterality Date   colon reconstruction     GASTRECTOMY  2014   OOPHORECTOMY     Family History  Problem Relation Age of Onset   Sarcoidosis Mother    Stomach cancer Paternal Uncle     Social History   Socioeconomic History   Marital status: Single    Spouse name: Not on file   Number of children: Not on file   Years of education: Not on file   Highest education level: Not on file  Occupational History   Not on file  Tobacco Use   Smoking status: Every Day    Types: Cigarettes   Smokeless tobacco: Never   Tobacco comments:    Smokes about 3 cigarettes/day  Substance and Sexual Activity   Alcohol use: No   Drug use: No   Sexual activity: Not on file  Other Topics Concern   Not on file  Social History Narrative   Lives alone. Independent in ADLs and IADLs. Works as a travel Corporate treasurer.    Social Determinants of Health   Financial Resource Strain: Not on file  Food Insecurity: Not on file  Transportation Needs: Not on file  Physical Activity: Not on file  Stress: Not on file  Social Connections: Not on file  Intimate Partner Violence: Not on file    Review of Systems:  Negative aside from that listed in individualized problem based charting.  Physical Exam:  Vitals:   03/23/22 1533  BP: 120/71  Pulse: 90  Temp: (!) 97.5 F (36.4 C)  TempSrc: Oral  SpO2: 100%  Weight: 125 lb 4.8 oz (56.8 kg)  Height: 5' 1.5" (1.562 m)   Physical Exam Constitutional:      Appearance: Normal appearance. She is normal weight. She is not ill-appearing.  HENT:      Head: Normocephalic and atraumatic.     Nose: Nose normal. No congestion.     Mouth/Throat:     Mouth: Mucous membranes are moist.     Pharynx: Oropharynx is clear. No oropharyngeal exudate.  Eyes:     General: No scleral icterus.    Extraocular Movements: Extraocular movements intact.     Conjunctiva/sclera: Conjunctivae normal.     Pupils: Pupils are equal, round, and reactive to light.  Cardiovascular:     Rate and Rhythm: Normal rate and regular rhythm.     Pulses: Normal pulses.     Heart sounds: Normal heart sounds. No murmur heard.    No friction rub. No gallop.  Pulmonary:     Effort: Pulmonary effort is normal.     Breath sounds: Normal breath sounds. No wheezing, rhonchi or rales.  Abdominal:     General: There is no distension.     Palpations: Abdomen is soft.     Tenderness: There is no abdominal tenderness. There is no guarding or rebound.  Musculoskeletal:     Cervical back: Normal range of motion.     Comments: Right knee with moderate effusion. ROM partially limited. Mild TTP diffusely.  Skin:    General: Skin is warm and dry.     Findings: No erythema.     Comments: Well-healed vertical scar on midline abdomen (from prior gastrectomy).  Neurological:     General: No focal deficit present.     Mental Status: She is alert and oriented to person, place, and time.  Psychiatric:        Mood and Affect: Mood normal.        Behavior: Behavior normal.        Thought Content: Thought content normal.      Assessment & Plan:   See Encounters Tab for problem based charting.  Patient discussed with Dr. Philipp Ovens

## 2022-03-23 NOTE — Assessment & Plan Note (Signed)
Patient with PHQ-9 of 17 during encounter today. She states that she does experience depressed mood, mainly in regards to her health. She has had a difficult time throughout her life with being diagnosed with sarcoidosis at age 46, crohn's disease at age 21, gastric cancer in her 51s. She feels like she has become very introverted, feeling more comfortable on her own and with her family. She denies any suicidal or homicidal ideation, and from our discussion it does not appear to be at high risk for self-harm. We discussed initiation of anti-depressant medication such as zoloft. However, patient refused intervention at this time. I do believe that anxiety is playing a factor in patient's symptoms, primarily anxiety in regards to her own health. Unfortunately, a GAD-7 was not performed today. We will get this at her next visit in 2 weeks. She was prescribed hydroxyzine by an ED provider, which she states helps her feel more calm. She will use this prn in the meantime.  Plan: -repeat PHQ-9 at next visit, if remains elevated, will discuss again benefit of SSRI -GAD-7 at next visit -hydroxyzine prn

## 2022-03-23 NOTE — Assessment & Plan Note (Signed)
Patient with recent ED visit for right knee pain. She was referred to the [facility], where she underwent an MRI that revealed pigmented villonodular synovitis. 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 She was advised to perform home exercises, received pain management with norco, and has been referred to orthopedic surgery for further evaluation.   Plan: -home exercise therapy -pain control with norco -has already been referred to orthopedic surgery

## 2022-03-23 NOTE — Assessment & Plan Note (Addendum)
MRI was showing concern for soft tissue lesion being a PVNS.  -Counseled on home exercise therapy and supportive care. - refilled norco -Referral to orthopedic surgery.

## 2022-03-23 NOTE — Assessment & Plan Note (Signed)
Diagnosed at age 46. She is not on any medications for this. Not currently following a GI specialist. Given history of gastric adenocarcinoma, will refer her to GI.  Plan: -referral to gastroenterology

## 2022-03-23 NOTE — Assessment & Plan Note (Signed)
Diagnosed at age 46. She is currently asymptomatic from this standpoint. Per patient, she previously followed a pulmonologist but has not established care with one after arriving to [facility]. She would like referral to pulmonology to establish care with one, which I believe is reasonable.   Plan: -referral to pulmonology

## 2022-03-23 NOTE — Assessment & Plan Note (Signed)
See "Overview" tab for details of treatment for adenocarcinoma of stomach. She is currently asymptomatic from this standpoint. She would like to establish care with a gastroenterologist as she has not had routine follow up over the past several 5-6 years. Given history of prior gastric cancer and Crohn's disease, will refer her to GI.  Plan: -referral to GI

## 2022-03-23 NOTE — Patient Instructions (Addendum)
Kristina Graves,  It was a pleasure seeing you in the clinic today.   I have placed a referral to the stomach doctors and to the lung doctors. They will be in contact with you to schedule your appointments. Please go to your appointment with orthopedic surgery for your knee pain. Please come back in 2 weeks for your next visit.  Please call our clinic at [phone] if you have any questions or concerns. The best time to call is Monday-Friday from 9am-4pm, but there is someone available 24/7 at the same number. If you need medication refills, please notify your pharmacy one week in advance and they will send [place] a request.   Thank you for letting us take part in your care. We look forward to seeing you next time!

## 2022-03-25 NOTE — Progress Notes (Signed)
Internal Medicine Clinic Attending  I saw and evaluated the patient.  I personally confirmed the key portions of the history and exam documented by Dr. Allyson Sabal and I reviewed pertinent patient test results.  The assessment, diagnosis, and plan were formulated together and I agree with the documentation in the resident's note.

## 2022-04-06 ENCOUNTER — Encounter: Payer: Self-pay | Admitting: Family Medicine

## 2022-04-09 ENCOUNTER — Encounter: Payer: Self-pay | Admitting: Student

## 2022-04-09 ENCOUNTER — Encounter: Payer: Commercial Managed Care - HMO | Admitting: Student

## 2022-04-12 ENCOUNTER — Encounter: Payer: Commercial Managed Care - HMO | Admitting: Student

## 2022-04-15 ENCOUNTER — Encounter: Payer: Self-pay | Admitting: Pulmonary Disease

## 2022-04-15 ENCOUNTER — Ambulatory Visit (INDEPENDENT_AMBULATORY_CARE_PROVIDER_SITE_OTHER): Payer: Commercial Managed Care - HMO | Admitting: Pulmonary Disease

## 2022-04-15 VITALS — BP 120/82 | HR 77 | Temp 97.2°F | Ht 61.0 in | Wt 124.4 lb

## 2022-04-15 DIAGNOSIS — D869 Sarcoidosis, unspecified: Secondary | ICD-10-CM

## 2022-04-15 DIAGNOSIS — R0602 Shortness of breath: Secondary | ICD-10-CM

## 2022-04-15 LAB — C-REACTIVE PROTEIN: CRP: <1 mg/dL (ref 0.5–20.0)

## 2022-04-15 LAB — SEDIMENTATION RATE: Sed Rate: 40 mm/hr — ABNORMAL HIGH (ref 0–20)

## 2022-04-15 MED ORDER — BUDESONIDE-FORMOTEROL FUMARATE 160-4.5 MCG/ACT IN AERO: 2.0000 | INHALATION_SPRAY | Freq: Two times a day (BID) | RESPIRATORY_TRACT | 5 refills | Status: DC

## 2022-04-15 MED ORDER — PREDNISONE 20 MG PO TABS: 30.0000 mg | ORAL_TABLET | Freq: Every day | ORAL | 5 refills | Status: DC

## 2022-04-15 NOTE — Patient Instructions (Signed)
Prescription for prednisone 30 mg sent to pharmacy for you  Go from 30 mg to 20 mg in about 2 to 3 weeks  After another 2 to 3 weeks go down to 10 mg  Goal is to try and stop prednisone about 3 to 6 months if symptoms are well controlled  Prescription for Symbicort, 2 puffs twice a day  Continue albuterol  Blood work for ESR, CRP  Breathing study will be ordered to be done on the day of next visit in 6 weeks-8 weeks   We will make a referral for rheumatology  You do need to see an ophthalmologist as well she did have sarcoidosis affecting her eyes in the past   Call with significant concerns  Next follow-up in 6 to 8 weeks

## 2022-04-15 NOTE — Progress Notes (Signed)
Kristina Graves    [id number]    02/21/1976  Primary Care Physician:Jinwala, Soyla Murphy, MD  Referring Physician: Axel Filler, MD [address] 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  Diagnosed with stomach cancer in 2014 for which she had surgery Had a bronchoscopy during that hospitalization showing evidence of sarcoidosis in the lungs Questionable history of Crohn's-patient states she feels that this diagnosis was made because granulomas were found in the stomach   Had previously been on prednisone and Imuran for many years  Was able to wean off prednisone and Imuran  Was recently seen in the emergency room for a swollen knee Was also seen for hemoptysis which has since resolved, has not had hemoptysis for about a month now-she was seen in the emergency department with a significant amount of hemoptysis she stated  History of allergies, asthma-has been on Proventil  She is a nurse  History of smoking  Outpatient Encounter Medications as of 04/15/2022  Medication Sig   budesonide-formoterol (SYMBICORT) 160-4.5 MCG/ACT inhaler Inhale 2 puffs into the lungs 2 (two) times daily.   predniSONE (DELTASONE) 20 MG tablet Take 1.5 tablets (30 mg total) by mouth daily with breakfast.   albuterol (VENTOLIN HFA) 108 (90 Base) MCG/ACT inhaler Inhale 1-2 puffs into the lungs every 6 (six) hours as needed for wheezing or shortness of breath.   HYDROcodone-acetaminophen (NORCO/VICODIN) 5-325 MG tablet Take 1 tablet by mouth every 8 (eight) hours as needed.   ibuprofen (ADVIL) 600 MG tablet Take 1 tablet (600 mg total) by mouth every 6 (six) hours as needed.   [DISCONTINUED] hydrOXYzine (ATARAX) 25 MG tablet Take 1 tablet (25 mg total) by mouth every 8 (eight) hours as needed.   No facility-administered encounter medications on file as  of 04/15/2022.    Allergies as of 04/15/2022 - Review Complete 04/15/2022  Allergen Reaction Noted   Tape Other (See Comments) 03/16/2022    Past Medical History:  Diagnosis Date   Cancer ([place])    stomach    Crohn disease ([place])    Essential hypertension 03/18/2022   PVNS (pigmented villonodular synovitis)    Sarcoidosis     Past Surgical History:  Procedure Laterality Date   colon reconstruction     GASTRECTOMY  2014   OOPHORECTOMY      Family History  Problem Relation Age of Onset   Sarcoidosis Mother    Stomach cancer Paternal Uncle     Social History   Socioeconomic History   Marital status: Single    Spouse name: Not on file   Number of children: Not on file   Years of education: Not on file   Highest education level: Not on file  Occupational History   Not on file  Tobacco Use   Smoking status: Every Day    Types: Cigarettes   Smokeless tobacco: Never   Tobacco comments:    Smokes about 3 cigs a week acs 04/14/22  Substance and Sexual Activity   Alcohol use: No   Drug use: No   Sexual activity: Not on file  Other Topics Concern   Not on file  Social History Narrative   Lives alone. Independent in ADLs and IADLs. Works as a travel Corporate treasurer.    Social Determinants of Health   Financial Resource Strain:  Not on file  Food Insecurity: Not on file  Transportation Needs: Not on file  Physical Activity: Not on file  Stress: Not on file  Social Connections: Not on file  Intimate Partner Violence: Not on file    Review of Systems  Respiratory:  Positive for cough, shortness of breath and wheezing.   Musculoskeletal:  Positive for arthralgias.    Vitals:   04/15/22 1310  BP: 120/82  Pulse: 77  Temp: (!) 97.2 F (36.2 C)  SpO2: 100%     Physical Exam Constitutional:      Appearance: Normal appearance.  HENT:     Head: Normocephalic.     Mouth/Throat:     Mouth: Mucous membranes are moist.  Cardiovascular:     Rate and Rhythm: Normal rate and  regular rhythm.     Heart sounds: No murmur heard.    No friction rub.  Pulmonary:     Effort: No respiratory distress.     Breath sounds: No stridor. No wheezing or rhonchi.  Musculoskeletal:     Cervical back: No rigidity or tenderness.  Neurological:     Mental Status: She is alert.  Psychiatric:        Mood and Affect: Mood normal.      Data Reviewed: Records from Dr. Roel Cluck at [facility] had spirometry performed showing normal spirometry -CT at the time did reveal some nodularity and adenopathy  Assessment:  Sarcoidosis  Arthritis  Past history of stomach cancer  Recent history of hemoptysis  History of shortness of breath, wheezing, coughing  She feels her constellation of symptoms is likely related to her sarcoidosis acting up with having more joint pain and discomfort, having more shortness of breath and chest tightness with coughing and wheezing -She had previously been steroids long-term -She is aware of side effect profile of steroids and we did discuss about some of them today including risk of infections, immunosuppression, osteopenia,  Plan/Recommendations: I did encourage her to make an appointment to see an ophthalmologist  Make a referral to see rheumatology  Obtain ESR and CRP  Start prednisone at 30 mg and try and wean down to a low-dose as soon as tolerated over the next 4 to 6 weeks  Schedule pulmonary function test for about 6 to 8 weeks from here  Symbicort to be used twice a day should help cough and wheezing  Risk with steroids discussed -Try and taper to a low-dose of steroids with hopes of being able to get off steroids in about 3 to 6 months   Sherrilyn Rist MD [facility] 04/15/2022, 1:43 PM  CC: Axel Filler,*

## 2022-04-16 ENCOUNTER — Other Ambulatory Visit: Payer: Self-pay | Admitting: *Deleted

## 2022-04-16 ENCOUNTER — Other Ambulatory Visit (HOSPITAL_COMMUNITY): Payer: Self-pay

## 2022-04-16 MED ORDER — PREDNISONE 20 MG PO TABS: ORAL_TABLET | ORAL | 2 refills | Status: DC

## 2022-04-21 ENCOUNTER — Telehealth (HOSPITAL_BASED_OUTPATIENT_CLINIC_OR_DEPARTMENT_OTHER): Payer: Self-pay

## 2022-04-21 ENCOUNTER — Telehealth: Payer: Self-pay

## 2022-04-21 ENCOUNTER — Other Ambulatory Visit (HOSPITAL_COMMUNITY): Payer: Self-pay

## 2022-04-21 ENCOUNTER — Other Ambulatory Visit: Payer: Self-pay | Admitting: Pulmonary Disease

## 2022-04-21 MED ORDER — FLUTICASONE FUROATE-VILANTEROL 200-25 MCG/ACT IN AEPB: 1.0000 | INHALATION_SPRAY | Freq: Every day | RESPIRATORY_TRACT | 3 refills | Status: DC

## 2022-04-21 NOTE — Telephone Encounter (Signed)
Received call from lab that synovial fluid was not sent to [facility] as it should have so the protein, glucose and uric acid was unable to be evaluated. Dr. Ronnald Nian notified, will wait for follow up instructions if necessary

## 2022-04-21 NOTE — Telephone Encounter (Signed)
Will place order for breo

## 2022-04-21 NOTE — Telephone Encounter (Signed)
PA received for Symbicort 160-4.5MCG/ACT aerosol via CMM from [facility].  The covered alternatives are: A. Breo ElliptaSilas Graves*; and C. fluticasone/salmeterol inhalation powder or Wixela Inhub (generics for Advair Diskus) or fluticasone propionate/salmeterol multidose dry powder inhaler (generic for AirDuo Respiclick). Forthe alternatives tried, please include drug name and strength, date(s) taken and for how long, and what the documented results were of taking each drug, including any intolerances or adverse reactions your patient experienced. For the alternatives NOT tried, please provide details why your patient can't try that drug.   Please advise.

## 2022-05-17 ENCOUNTER — Encounter: Payer: Self-pay | Admitting: Internal Medicine

## 2022-05-17 ENCOUNTER — Ambulatory Visit (INDEPENDENT_AMBULATORY_CARE_PROVIDER_SITE_OTHER): Payer: Commercial Managed Care - HMO | Admitting: Internal Medicine

## 2022-05-17 VITALS — BP 154/92 | HR 88 | Temp 98.1°F

## 2022-05-17 DIAGNOSIS — N926 Irregular menstruation, unspecified: Secondary | ICD-10-CM | POA: Insufficient documentation

## 2022-05-17 DIAGNOSIS — Z85028 Personal history of other malignant neoplasm of stomach: Secondary | ICD-10-CM

## 2022-05-17 DIAGNOSIS — D869 Sarcoidosis, unspecified: Secondary | ICD-10-CM

## 2022-05-17 LAB — POCT URINE PREGNANCY: Preg Test, Ur: NEGATIVE

## 2022-05-17 NOTE — Patient Instructions (Signed)
Thank you, Ms.Harley Hallmark for allowing [place] to provide your care today. Today we discussed:  Vomiting/burping: We will follow up with the referral to the [facility] doctors and get you in asap. You may need another endscopy/colonoscopy  Urine pregnancy test done today  Referral to ophthalmology     I have ordered the following labs for you:   Lab Orders         POCT Urine Pregnancy      Referrals ordered today:    Referral Orders         Ambulatory referral to Ophthalmology      Ambulatory referral to Gastroenterology  I have ordered the following medication/changed the following medications:   Stop the following medications: There are no discontinued medications.   Start the following medications: No orders of the defined types were placed in this encounter.    Follow up: 3 months f/u GI sxs   Should you have any questions or concerns please call the internal medicine clinic at [phone].     Buddy Duty, D.O. [place]

## 2022-05-17 NOTE — Assessment & Plan Note (Signed)
The patient states that she has not had a menstrual period and 49 days, prior to this, her cycles came regularly each month.  Urine pregnancy test was negative today, thus may need work-up for perimenopause with [facility] at next office visit if her period does not come.

## 2022-05-17 NOTE — Assessment & Plan Note (Signed)
Patient has a history of gastric cancer treated in 2014 in [place].  Patient has not regularly followed up with gastroenterology, and now reports new concerning symptoms.  She states that she has had nightly episodes of emesis over the past month, as well as belching in which she describes it as smelling like rotten eggs.  Her emesis is nonbilious or nonbloody, but the patient reports that she will wake up in the middle of the night and noticed that she had already vomited.  This happened every night over the last week.  The patient denies any fevers, chills, or weight loss.  She was previously referred to GI in October, but has not heard from them regarding an upcoming appointment.  Additionally, the patient has been off of a PPI since 2014, as she is concerned due to the ads stating that they can cause cancer.  The patient likely will need an updated EGD, thus we will try to get her in with GI as soon as possible.  Plan: - Urgent referral to GI, gave patient phone number

## 2022-05-17 NOTE — Progress Notes (Signed)
CC: depression f/u  HPI:  [name] is a 46 y.o. female living with a history stated below and presents today for a follow up of depression. Please see problem based assessment and plan for additional details.  Past Medical History:  Diagnosis Date   Cancer ([place])    stomach    Crohn disease ([place])    Essential hypertension 03/18/2022   PVNS (pigmented villonodular synovitis)    Sarcoidosis     Current Outpatient Medications on File Prior to Visit  Medication Sig Dispense Refill   albuterol (VENTOLIN HFA) 108 (90 Base) MCG/ACT inhaler Inhale 1-2 puffs into the lungs every 6 (six) hours as needed for wheezing or shortness of breath. 18 g 0   budesonide-formoterol (SYMBICORT) 160-4.5 MCG/ACT inhaler Inhale 2 puffs into the lungs 2 (two) times daily. 1 each 5   fluticasone furoate-vilanterol (BREO ELLIPTA) 200-25 MCG/ACT AEPB Inhale 1 puff into the lungs daily. 60 each 3   HYDROcodone-acetaminophen (NORCO/VICODIN) 5-325 MG tablet Take 1 tablet by mouth every 8 (eight) hours as needed. 15 tablet 0   ibuprofen (ADVIL) 600 MG tablet Take 1 tablet (600 mg total) by mouth every 6 (six) hours as needed. 30 tablet 0   predniSONE (DELTASONE) 20 MG tablet Take 1.5 tablets (30 mg total) by mouth daily with breakfast for 21 days, THEN 1 tablet (20 mg total) daily with breakfast for 21 days. Then 1tablet (55m) and continue about 3-673month and then stop.. 90 tablet 2   No current facility-administered medications on file prior to visit.    Family History  Problem Relation Age of Onset   Sarcoidosis Mother    Stomach cancer Paternal Uncle     Social History   Socioeconomic History   Marital status: Single    Spouse name: Not on file   Number of children: Not on file   Years of education: Not on file   Highest education level: Not on file  Occupational History   Not on file  Tobacco Use   Smoking status: Every Day    Types: Cigarettes   Smokeless tobacco: Never   Tobacco  comments:    Smokes about 3 cigs a week acs 04/14/22  Substance and Sexual Activity   Alcohol use: No   Drug use: No   Sexual activity: Not on file  Other Topics Concern   Not on file  Social History Narrative   Lives alone. Independent in ADLs and IADLs. 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  Vitals:   05/17/22 0907 05/17/22 0943  BP: (!) 143/90 (!) 154/92  Pulse: 89 88  Temp: 98.1 F (36.7 C)   TempSrc: Oral   SpO2: 100%     Physical Exam: Constitutional: well-appearing female sitting in chair, in no acute distress Cardiovascular: regular rate and rhythm, no m/r/g Pulmonary/Chest: normal work of breathing on room air, lungs clear to auscultation bilaterally Abdominal: soft, non-tender, slightly distended, normal BS MSK: normal bulk and tone Neurological: alert & oriented x 3, no focal deficit Skin: warm and dry Psych: normal mood and behavior  Assessment & Plan:  Patient discussed with Dr. Daryll Drown  History of gastric cancer Patient has a history of gastric cancer treated in 2014 in [place].  Patient has not regularly followed up with gastroenterology, and now reports new concerning symptoms.  She states that she has had nightly episodes of emesis over the past month, as well as belching in which she describes it as smelling like rotten eggs.  Her emesis is nonbilious or nonbloody, but the patient reports that she will wake up in the middle of the night and noticed that she had already vomited.  This happened every night over the last week.  The patient denies any fevers, chills, or weight loss.  She was previously referred to  GI in October, but has not heard from them regarding an upcoming appointment.  Additionally, the patient has been off of a PPI since 2014, as she is concerned due to the ads stating that they can cause cancer.  The patient likely will need an updated EGD, thus we will try to get her in with GI as soon as possible.  Plan: - Urgent referral to GI, gave patient phone number  Late period The patient states that she has not had a menstrual period and 49 days, prior to this, her cycles came regularly each month.  Urine pregnancy test was negative today, thus may need work-up for perimenopause with [facility] at next office visit if her period does not come.   Buddy Duty, D.O. [facility], PGY-2 Phone: [phone] Date 05/17/2022 Time 10:34 AM

## 2022-05-19 NOTE — Progress Notes (Signed)
Internal Medicine Clinic Attending  Case discussed with Dr. Raymondo Band  at the time of the visit.  We reviewed the resident's history and exam and pertinent patient test results.  I agree with the assessment, diagnosis, and plan of care documented in the resident's note.

## 2022-06-09 ENCOUNTER — Telehealth: Payer: Self-pay | Admitting: Pulmonary Disease

## 2022-06-09 ENCOUNTER — Ambulatory Visit: Payer: Commercial Managed Care - HMO | Admitting: Pulmonary Disease

## 2022-06-09 NOTE — Telephone Encounter (Signed)
PT missed appt today. Lost dad. I called to reschedual. In speaking with her she thought she was supposed to have a PFT, not a office visit today.  Please call to advise and reschedule either another OV or PFT. Thanks.

## 2022-06-09 NOTE — Telephone Encounter (Signed)
Called and spoke with patient, she missed todays appointment due to her dad recently passing. I was able to reschedule her PFT for this Friday and her OV with Dr. Jenetta Downer on 07/13/22. Pt verbalized understanding. Nothing further needed.

## 2022-06-29 ENCOUNTER — Encounter: Payer: Commercial Managed Care - HMO | Admitting: Student

## 2022-07-13 ENCOUNTER — Ambulatory Visit: Payer: Commercial Managed Care - HMO | Admitting: Pulmonary Disease

## 2022-07-27 ENCOUNTER — Encounter: Payer: Commercial Managed Care - HMO | Admitting: Internal Medicine

## 2022-07-29 ENCOUNTER — Telehealth: Payer: Self-pay

## 2022-07-29 NOTE — Transitions of Care (Post Inpatient/ED Visit) (Signed)
   07/29/2022  Name: Kristina Graves MRN: [id number] DOB: [birth year]  Today's TOC FU Call Status: Today's TOC FU Call Status:: Successful TOC FU Call Competed TOC FU Call Complete Date: 07/29/22  Transition Care Management Follow-up Telephone Call Date of Discharge: 07/28/22 Discharge Facility: Other Facility Name of Other Discharge Facility: [facility] Type of Discharge: Inpatient Admission How have you been since you were released from the hospital?: Better Any questions or concerns?: No  Items Reviewed: Did you receive and understand the discharge instructions provided?: Yes Medications obtained and verified?: Yes (Medications Reviewed) Any new allergies since your discharge?: No Dietary orders reviewed?: Yes Do you have support at home?: No People in Home: alone  Home Care and Equipment/Supplies: [place] Ordered?: NA Any new equipment or medical supplies ordered?: NA  Functional Questionnaire: Do you need assistance with bathing/showering or dressing?: No Do you need assistance with meal preparation?: No Do you need assistance with eating?: No Do you have difficulty maintaining continence: No Do you need assistance with getting out of bed/getting out of a chair/moving?: No Do you have difficulty managing or taking your medications?: No  Folllow up appointments reviewed: PCP Follow-up appointment confirmed?: No (no avail appt times, sent message to staff to schedule) Specialist Hospital Follow-up appointment confirmed?: NA Do you need transportation to your follow-up appointment?: No Do you understand care options if your condition(s) worsen?: Yes-patient verbalized understanding    [place], [place] Nurse Health Advisor Direct Dial [phone]

## 2022-08-10 ENCOUNTER — Encounter: Payer: BLUE CROSS/BLUE SHIELD | Admitting: Student

## 2022-09-27 ENCOUNTER — Encounter: Payer: Self-pay | Admitting: *Deleted

## 2022-10-16 ENCOUNTER — Other Ambulatory Visit: Payer: Self-pay | Admitting: Pulmonary Disease

## 2022-10-19 NOTE — Telephone Encounter (Signed)
Signed off on the request for prednisone taper  Will not refill prednisone unless patient being followed closely

## 2022-11-04 ENCOUNTER — Other Ambulatory Visit: Payer: Self-pay | Admitting: Infectious Diseases

## 2022-11-04 DIAGNOSIS — K50919 Crohn's disease, unspecified, with unspecified complications: Secondary | ICD-10-CM

## 2022-11-04 DIAGNOSIS — Z85028 Personal history of other malignant neoplasm of stomach: Secondary | ICD-10-CM

## 2022-11-15 ENCOUNTER — Encounter: Payer: Self-pay | Admitting: *Deleted

## 2022-12-13 ENCOUNTER — Encounter: Payer: BLUE CROSS/BLUE SHIELD | Admitting: Student

## 2022-12-27 ENCOUNTER — Ambulatory Visit (INDEPENDENT_AMBULATORY_CARE_PROVIDER_SITE_OTHER): Payer: BLUE CROSS/BLUE SHIELD | Admitting: Student

## 2022-12-27 ENCOUNTER — Other Ambulatory Visit: Payer: Self-pay

## 2022-12-27 ENCOUNTER — Encounter: Payer: Self-pay | Admitting: Student

## 2022-12-27 VITALS — BP 114/69 | HR 98 | Temp 98.2°F | Ht 61.0 in | Wt 141.5 lb

## 2022-12-27 DIAGNOSIS — D649 Anemia, unspecified: Secondary | ICD-10-CM

## 2022-12-27 DIAGNOSIS — F32A Depression, unspecified: Secondary | ICD-10-CM | POA: Diagnosis not present

## 2022-12-27 DIAGNOSIS — D869 Sarcoidosis, unspecified: Secondary | ICD-10-CM

## 2022-12-27 DIAGNOSIS — D508 Other iron deficiency anemias: Secondary | ICD-10-CM

## 2022-12-27 DIAGNOSIS — Z85028 Personal history of other malignant neoplasm of stomach: Secondary | ICD-10-CM

## 2022-12-27 DIAGNOSIS — N926 Irregular menstruation, unspecified: Secondary | ICD-10-CM

## 2022-12-27 DIAGNOSIS — R0789 Other chest pain: Secondary | ICD-10-CM

## 2022-12-27 MED ORDER — DULOXETINE HCL 30 MG PO CPEP
30.0000 mg | ORAL_CAPSULE | Freq: Every day | ORAL | 2 refills | Status: DC
Start: 2022-12-27 — End: 2023-04-13

## 2022-12-27 NOTE — Assessment & Plan Note (Signed)
Weight stable.  No severe anemia.  Nausea and vomiting may have been in the setting of hypertension during recent ED visit, per this person's report.  Needs routine physical exams.  Attempted to get iron levels today, but this person left before labs could be drawn.  Takes a B12 supplement and recent levels were supraphysiologic.  Referred to GI for help with surveillance.

## 2022-12-27 NOTE — Patient Instructions (Signed)
Remember to bring all of the medications that you take (including over the counter medications and supplements) with you to every clinic visit.  This after visit summary is an important review of tests, referrals, and medication changes that were discussed during your visit. If you have questions or concerns, call [phone]. Outside of clinic business hours, call the main hospital at [phone] and ask the operator for the on-call internal medicine resident.   Marrianne Mood MD 12/27/2022, 3:42 PM

## 2022-12-27 NOTE — Assessment & Plan Note (Signed)
Mild anemia during evaluation for nausea and vomiting at ED encounter recently.  No signs of bleeding.  Plan to get iron panel, but this person left before labs are drawn.

## 2022-12-27 NOTE — Assessment & Plan Note (Signed)
Chest wall pain has been bothering her ever since her thoracenteses in January.  Chronic at this point, she is taking a lot of Advil and Tylenol for this.  Recently the Advil seems to have been hurting her stomach.  Recommended that she stop the Advil and initiate a trial of Cymbalta for pain.  Hope to follow-up in 2 weeks to assess response to this intervention.

## 2022-12-27 NOTE — Progress Notes (Signed)
Subjective:  Kristina Graves is a 47 y.o. who presents to clinic for the following:  Follow-up after encounter in [facility] on July 10 for nausea and vomiting, found to have small arm abscess treated with clindamycin and discharged from ED. Since last clinic visit, was hospitalized for MRSA bacteremia and lung abscesses in January. Since then, has not been feeling well overall.  Left lower quadrant pain and low back pain. Takes a lot of Advil and Tylenol for pleuritic type pain in chest and side, ongoing since hospitalization.  Back pain doesn't radiate to legs. Lives in two-storey apartment and gets short of breath walking upstairs, has to rest on first floor landing. Requests prednisone for what she perceives to be symptoms of her sarcoidosis.  Review of Systems  Constitutional:  Positive for chills, diaphoresis, malaise/fatigue and weight loss. Negative for fever.  Respiratory:  Positive for shortness of breath and wheezing. Negative for cough.   Cardiovascular:  Positive for chest pain (On inspiration). Negative for palpitations and leg swelling.  Gastrointestinal:  Positive for abdominal pain. Negative for blood in stool and melena.  Endo/Heme/Allergies:  Does not bruise/bleed easily.  Psychiatric/Behavioral:  Positive for depression. Negative for suicidal ideas.    Objective:   Vitals:   12/27/22 1434  BP: 114/69  Pulse: 98  Temp: 98.2 F (36.8 C)  TempSrc: Oral  SpO2: 98%  Weight: 141 lb 8 oz (64.2 kg)  Height: 5\' 1"  (1.549 m)    Physical Exam Overall well-appearing, no distress Neck is supple, no palpable thyromegaly or thyroid nodules Heart rate is borderline tachycardic, rhythm is regular, radial pulses are strong, no lower extremity edema, no appreciable murmurs Breathing is regular and unlabored on room air, no wheezing or crackles, no clubbing of fingertips Skin is warm and dry Abdomen is diffusely tender to palpation without rebound tenderness or  guarding Alert and oriented, gait observed is normal, speech is normal sounding Mood seems labile, tearful at times, she is talkative and speech is fast but not pressured  Assessment & Plan:   Problem List Items Addressed This Visit     Sarcoidosis (Chronic)    Having some dyspnea with stairs at her apartment, also reports some malaise.  Reports weight loss but objective data suggests otherwise.  Unfortunately she has not been able to keep her follow-up appointments with pulmonology.  I see notes dating to May where she was prescribed a prednisone taper but instructed to follow-up before any refills would be ordered.  She is well-appearing on exam and not objectively dyspneic.  I am hesitant to prescribe another course of steroids without adequate pulmonology follow-up.  Will refer today and gave her contact information for pulmonology clinic where she was previously seen.      Relevant Orders   Ambulatory referral to Pulmonology   Anemia - Primary    Mild anemia during evaluation for nausea and vomiting at ED encounter recently.  No signs of bleeding.  Plan to get iron panel, but this person left before labs are drawn.      Relevant Orders   Iron, TIBC and Ferritin Panel   Depressive episode    Frequent and recurrent.  No suicidality or thoughts of self-harm.  She attributes her mood symptoms to her health problems and the death of her father last year, who she states was poisoned by a significant other for reasons of securing insurance pay out.  Her mental status exam seems normal for the circumstances.  She  does not have pressured speech and she reports a lot of fatigue, and consistent with a diagnosis of bipolar disorder.  My plan was to initiate a trial of duloxetine for combined indication of mood changes and chronic pain, but I have doubts that she will initiate this therapy.      Relevant Medications   DULoxetine (CYMBALTA) 30 MG capsule   History of gastric cancer    Weight  stable.  No severe anemia.  Nausea and vomiting may have been in the setting of hypertension during recent ED visit, per this person's report.  Needs routine physical exams.  Attempted to get iron levels today, but this person left before labs could be drawn.  Takes a B12 supplement and recent levels were supraphysiologic.  Referred to GI for help with surveillance.      Relevant Orders   Ambulatory referral to Gastroenterology   Late period    Amenorrheic since September 2023.  Given fatigue I think this is enough to check a TSH today.  Unfortunately we did not have time to address potential for perimenopausal status during today's visit.      Chest wall pain    Chest wall pain has been bothering her ever since her thoracenteses in January.  Chronic at this point, she is taking a lot of Advil and Tylenol for this.  Recently the Advil seems to have been hurting her stomach.  Recommended that she stop the Advil and initiate a trial of Cymbalta for pain.  Hope to follow-up in 2 weeks to assess response to this intervention.      Relevant Medications   DULoxetine (CYMBALTA) 30 MG capsule   Other Visit Diagnoses     Menstrual period late       Relevant Orders   TSH   T4, Free       Return in 2 weeks for follow-up of pain.  Patient discussed with Dr. Elige Ko MD 12/27/2022, 5:42 PM  [phone]

## 2022-12-27 NOTE — Assessment & Plan Note (Signed)
Frequent and recurrent.  No suicidality or thoughts of self-harm.  She attributes her mood symptoms to her health problems and the death of her father last year, who she states was poisoned by a significant other for reasons of securing insurance pay out.  Her mental status exam seems normal for the circumstances.  She does not have pressured speech and she reports a lot of fatigue, and consistent with a diagnosis of bipolar disorder.  My plan was to initiate a trial of duloxetine for combined indication of mood changes and chronic pain, but I have doubts that she will initiate this therapy.

## 2022-12-27 NOTE — Assessment & Plan Note (Signed)
Amenorrheic since September 2023.  Given fatigue I think this is enough to check a TSH today.  Unfortunately we did not have time to address potential for perimenopausal status during today's visit.

## 2022-12-27 NOTE — Assessment & Plan Note (Signed)
Having some dyspnea with stairs at her apartment, also reports some malaise.  Reports weight loss but objective data suggests otherwise.  Unfortunately she has not been able to keep her follow-up appointments with pulmonology.  I see notes dating to May where she was prescribed a prednisone taper but instructed to follow-up before any refills would be ordered.  She is well-appearing on exam and not objectively dyspneic.  I am hesitant to prescribe another course of steroids without adequate pulmonology follow-up.  Will refer today and gave her contact information for pulmonology clinic where she was previously seen.

## 2022-12-28 NOTE — Progress Notes (Signed)
 Internal Medicine Clinic Attending  Case discussed with the resident at the time of the visit.  We reviewed the resident's history and exam and pertinent patient test results.  I agree with the assessment, diagnosis, and plan of care documented in the resident's note.  

## 2023-01-18 ENCOUNTER — Telehealth: Payer: Self-pay | Admitting: Pulmonary Disease

## 2023-01-18 MED ORDER — PREDNISONE 10 MG PO TABS: 10.0000 mg | ORAL_TABLET | Freq: Every day | ORAL | 1 refills | Status: DC

## 2023-01-18 MED ORDER — PREDNISONE 20 MG PO TABS: ORAL_TABLET | ORAL | 0 refills | Status: AC

## 2023-01-18 NOTE — Telephone Encounter (Signed)
Patient is requesting prescription of predinisone. She is SOB with exertion, and feels pain when breathing. Since LOV in November 2023, she has been in and out of the hospital. [facility] is [facility] [address] in [place]. Set up overdue f/u for 9/10 with Tammy.

## 2023-01-18 NOTE — Telephone Encounter (Signed)
Called and spoke with patient. Patient stated she is having a flare.  Patient stated she has been having increased sob with exertion and some chest tightness for past week. Patient stated she is using her Albuterol and taking Symbicort as directed, but still feeling she needs a short prednisone taper.  Patient requested prescription to [facility].  Patient is scheduled for a follow up with Tammy,NP 02/22/23. Patient LOV was 04/15/22 with Dr. Wynona Neat.   Message routed to Dr. Judeth Horn to advise as provider of the day

## 2023-01-18 NOTE — Telephone Encounter (Signed)
Prednisone 40 mg for 5 days and 20 mg for 5 days sent to local pharmacy.  It appears per most recent prednisone prescription she is to take 10 mg daily.  I fear this is run out in the interim since last prescription.  Does she need a refill on her chronic prednisone 10 mg daily?  Can we get her an appointment to see Dr. Aldean Ast or Tammy any sooner than 9/10?

## 2023-01-18 NOTE — Telephone Encounter (Signed)
Prednisone 10 mg daily to take after prednisone taper - new script sent to pharmacy

## 2023-01-18 NOTE — Telephone Encounter (Signed)
Patient stated she was on a daily Prednisone, but ran out 2 weeks ago.  Patient stated she did need a refill for daily Prednisone.  At this time, 02/22/23 is the soonest patient can be see in office.  Patient stated she would be checking in for cancellations.   Message routed to Dr. Judeth Horn to advise on refill

## 2023-01-19 NOTE — Telephone Encounter (Signed)
Patient aware daily Prednisone prescription was sent to pharmacy. Nothing further at this time.

## 2023-02-22 ENCOUNTER — Ambulatory Visit: Payer: BLUE CROSS/BLUE SHIELD | Admitting: Adult Health

## 2023-02-27 ENCOUNTER — Encounter (HOSPITAL_BASED_OUTPATIENT_CLINIC_OR_DEPARTMENT_OTHER): Payer: Self-pay

## 2023-02-27 ENCOUNTER — Emergency Department (HOSPITAL_BASED_OUTPATIENT_CLINIC_OR_DEPARTMENT_OTHER): Payer: BLUE CROSS/BLUE SHIELD

## 2023-02-27 ENCOUNTER — Other Ambulatory Visit: Payer: Self-pay

## 2023-02-27 ENCOUNTER — Emergency Department
Admission: EM | Admit: 2023-02-27 | Discharge: 2023-02-27 | Disposition: A | Discharge status: Other Acute Inpatient Hospital | Payer: BLUE CROSS/BLUE SHIELD | Source: Home / Self Care | Attending: Emergency Medicine | Admitting: Emergency Medicine

## 2023-02-27 DIAGNOSIS — Z1152 Encounter for screening for COVID-19: Secondary | ICD-10-CM | POA: Insufficient documentation

## 2023-02-27 DIAGNOSIS — L0390 Cellulitis, unspecified: Secondary | ICD-10-CM | POA: Insufficient documentation

## 2023-02-27 DIAGNOSIS — Z8614 Personal history of Methicillin resistant Staphylococcus aureus infection: Secondary | ICD-10-CM | POA: Insufficient documentation

## 2023-02-27 DIAGNOSIS — A419 Sepsis, unspecified organism: Secondary | ICD-10-CM | POA: Insufficient documentation

## 2023-02-27 DIAGNOSIS — J189 Pneumonia, unspecified organism: Secondary | ICD-10-CM | POA: Insufficient documentation

## 2023-02-27 LAB — MANUAL DIFFERENTIAL
BAND %: 10 % (ref 5–11)
BANDS NEUTROPHILS MANUAL: 10
LYMPHOCYTE %: 13 % — ABNORMAL LOW (ref 25–45)
LYMPHOCYTE ABSOLUTE: 3.8 10*3/uL (ref 1.10–5.00)
LYMPHOCYTES MANUAL: 13
MONOCYTE %: 4 % (ref 0–12)
MONOCYTE ABSOLUTE: 1.17 10*3/uL (ref 0.00–1.30)
MONOCYTES MANUAL: 4
NEUTROPHIL %: 73 % (ref 40–76)
NEUTROPHIL ABSOLUTE: 24.24 10*3/uL — ABNORMAL HIGH (ref 1.80–8.40)
NEUTROPHILS MANUAL: 73
PLATELET MORPHOLOGY COMMENT: ADEQUATE
TOTAL CELLS COUNTED [#] IN BLOOD: 100
WBC: 29.2 10*3/uL

## 2023-02-27 LAB — URINALYSIS, MACRO/MICRO
BILIRUBIN: NEGATIVE mg/dL
GLUCOSE: 100 mg/dL — AB
KETONES: NEGATIVE mg/dL
LEUKOCYTES: NEGATIVE WBCs/uL
NITRITE: NEGATIVE
PH: 6.5 (ref 4.6–8.0)
PROTEIN: NEGATIVE mg/dL
SPECIFIC GRAVITY: 1.01 (ref 1.003–1.035)
UROBILINOGEN: 0.2 mg/dL (ref 0.2–1.0)

## 2023-02-27 LAB — COMPREHENSIVE METABOLIC PANEL, NON-FASTING
ALBUMIN/GLOBULIN RATIO: 0.8 (ref 0.8–1.4)
ALBUMIN: 2.8 g/dL — ABNORMAL LOW (ref 3.4–5.0)
ALKALINE PHOSPHATASE: 85 U/L (ref 46–116)
ALT (SGPT): 24 U/L (ref <=78)
ANION GAP: 11 mmol/L (ref 4–13)
AST (SGOT): 48 U/L — ABNORMAL HIGH (ref 15–37)
BILIRUBIN TOTAL: 0.4 mg/dL (ref 0.2–1.0)
BUN/CREA RATIO: 20
BUN: 24 mg/dL — ABNORMAL HIGH (ref 7–18)
CALCIUM, CORRECTED: 10.4 mg/dL
CALCIUM: 9.4 mg/dL (ref 8.5–10.1)
CHLORIDE: 105 mmol/L (ref 98–107)
CO2 TOTAL: 26 mmol/L (ref 21–32)
CREATININE: 1.23 mg/dL — ABNORMAL HIGH (ref 0.55–1.02)
ESTIMATED GFR: 55 mL/min/{1.73_m2} — ABNORMAL LOW (ref >59)
GLOBULIN: 3.7
GLUCOSE: 214 mg/dL — ABNORMAL HIGH (ref 74–106)
OSMOLALITY, CALCULATED: 294 mOsm/kg — ABNORMAL HIGH (ref 270–290)
POTASSIUM: 4.3 mmol/L (ref 3.5–5.1)
PROTEIN TOTAL: 6.5 g/dL (ref 6.4–8.2)
SODIUM: 142 mmol/L (ref 136–145)

## 2023-02-27 LAB — CBC WITH DIFF
HCT: 31.8 % — ABNORMAL LOW (ref 37.0–47.0)
HGB: 10 g/dL — ABNORMAL LOW (ref 12.5–16.0)
MCH: 33.6 pg — ABNORMAL HIGH (ref 27.0–32.0)
MCHC: 31.4 g/dL — ABNORMAL LOW (ref 32.0–36.0)
MCV: 106.8 fL — ABNORMAL HIGH (ref 78.0–99.0)
MPV: 8.1 fL (ref 7.4–10.4)
PLATELETS: 365 10*3/uL (ref 140–440)
RBC: 2.98 10*6/uL — ABNORMAL LOW (ref 4.20–5.40)
RDW: 15.9 % — ABNORMAL HIGH (ref 11.6–14.8)
WBC: 29.2 10*3/uL — ABNORMAL HIGH (ref 4.0–10.5)

## 2023-02-27 LAB — LACTIC ACID LEVEL W/ REFLEX FOR LEVEL >2.0: LACTIC ACID: 1.9 mmol/L (ref 0.4–2.0)

## 2023-02-27 LAB — COVID-19, FLU A/B, RSV RAPID BY PCR
INFLUENZA VIRUS TYPE A: NOT DETECTED
INFLUENZA VIRUS TYPE B: NOT DETECTED
RESPIRATORY SYNCTIAL VIRUS (RSV): NOT DETECTED
SARS-CoV-2: NOT DETECTED

## 2023-02-27 LAB — URINALYSIS, MICROSCOPIC

## 2023-02-27 LAB — SEDIMENTATION RATE: ERYTHROCYTE SEDIMENTATION RATE (ESR): 79 mm/hr — ABNORMAL HIGH (ref <20)

## 2023-02-27 LAB — TROPONIN-I: TROPONIN I: 11 ng/L (ref <15)

## 2023-02-27 MED ORDER — LACTATED RINGERS IV BOLUS
30.0000 mL/kg | INJECTION | Freq: Once | Status: AC
Start: 2023-02-27 — End: 2023-02-27
  Administered 2023-02-27: 1905 mL via INTRAVENOUS
  Administered 2023-02-27: 0 mL via INTRAVENOUS

## 2023-02-27 MED ORDER — CEFEPIME 2 GRAM SOLUTION FOR INJECTION
INTRAMUSCULAR | Status: AC
Start: 2023-02-27 — End: 2023-02-27
  Filled 2023-02-27: qty 12.5

## 2023-02-27 MED ORDER — LORAZEPAM 1 MG TABLET
2.0000 mg | ORAL_TABLET | ORAL | Status: AC
Start: 2023-02-27 — End: 2023-02-27
  Administered 2023-02-27: 2 mg via ORAL

## 2023-02-27 MED ORDER — KETOROLAC 30 MG/ML (1 ML) INJECTION SOLUTION
15.0000 mg | INTRAMUSCULAR | Status: AC
Start: 2023-02-27 — End: 2023-02-27
  Administered 2023-02-27: 15 mg via INTRAVENOUS

## 2023-02-27 MED ORDER — IBUPROFEN 800 MG TABLET
800.0000 mg | ORAL_TABLET | Freq: Four times a day (QID) | ORAL | Status: DC | PRN
Start: 2023-02-27 — End: 2023-02-27

## 2023-02-27 MED ORDER — MORPHINE 4 MG/ML INJECTION WRAPPER
4.0000 mg | INJECTION | INTRAMUSCULAR | Status: AC
Start: 2023-02-27 — End: 2023-02-27
  Administered 2023-02-27: 4 mg via INTRAVENOUS

## 2023-02-27 MED ORDER — MORPHINE 2 MG/ML INJECTION WRAPPER
2.0000 mg | INJECTION | INTRAMUSCULAR | Status: DC | PRN
Start: 2023-02-27 — End: 2023-02-27

## 2023-02-27 MED ORDER — ONDANSETRON HCL (PF) 4 MG/2 ML INJECTION SOLUTION
INTRAMUSCULAR | Status: AC
Start: 2023-02-27 — End: 2023-02-27
  Filled 2023-02-27: qty 2

## 2023-02-27 MED ORDER — ACETAMINOPHEN 325 MG TABLET
975.0000 mg | ORAL_TABLET | Freq: Four times a day (QID) | ORAL | Status: DC | PRN
Start: 2023-02-27 — End: 2023-02-27
  Administered 2023-02-27: 975 mg via ORAL

## 2023-02-27 MED ORDER — IOHEXOL 350 MG IODINE/ML INTRAVENOUS SOLUTION
100.0000 mL | INTRAVENOUS | Status: AC
Start: 2023-02-27 — End: 2023-02-27
  Administered 2023-02-27: 100 mL via INTRAVENOUS

## 2023-02-27 MED ORDER — MORPHINE 4 MG/ML INJECTION WRAPPER
INJECTION | INTRAMUSCULAR | Status: AC
Start: 2023-02-27 — End: 2023-02-27
  Filled 2023-02-27: qty 1

## 2023-02-27 MED ORDER — MORPHINE 2 MG/ML INJECTION WRAPPER
2.0000 mg | INJECTION | INTRAMUSCULAR | Status: AC
Start: 2023-02-27 — End: 2023-02-27
  Administered 2023-02-27: 2 mg via INTRAVENOUS

## 2023-02-27 MED ORDER — ONDANSETRON HCL (PF) 4 MG/2 ML INJECTION SOLUTION
4.0000 mg | INTRAMUSCULAR | Status: AC
Start: 2023-02-27 — End: 2023-02-27
  Administered 2023-02-27: 4 mg via INTRAVENOUS

## 2023-02-27 MED ORDER — ACETAMINOPHEN 325 MG TABLET
ORAL_TABLET | ORAL | Status: AC
Start: 2023-02-27 — End: 2023-02-27
  Filled 2023-02-27: qty 3

## 2023-02-27 MED ORDER — ACETAMINOPHEN 325 MG TABLET
975.0000 mg | ORAL_TABLET | ORAL | Status: AC
Start: 2023-02-27 — End: 2023-02-27
  Administered 2023-02-27: 975 mg via ORAL

## 2023-02-27 MED ORDER — SODIUM CHLORIDE 0.9 % INTRAVENOUS PIGGYBACK
2.0000 g | Freq: Two times a day (BID) | INTRAVENOUS | Status: DC
Start: 2023-02-27 — End: 2023-02-27

## 2023-02-27 MED ORDER — LINEZOLID IN 5% DEXTROSE IN WATER 600 MG/300 ML INTRAVENOUS PIGGYBACK
INJECTION | INTRAVENOUS | Status: AC
Start: 2023-02-27 — End: 2023-02-27
  Filled 2023-02-27: qty 300

## 2023-02-27 MED ORDER — SODIUM CHLORIDE 0.9 % INTRAVENOUS PIGGYBACK
2.0000 g | INTRAVENOUS | Status: AC
Start: 2023-02-27 — End: 2023-02-27
  Administered 2023-02-27: 2 g via INTRAVENOUS
  Administered 2023-02-27: 0 g via INTRAVENOUS

## 2023-02-27 MED ORDER — LINEZOLID IN 5% DEXTROSE IN WATER 600 MG/300 ML INTRAVENOUS PIGGYBACK
600.0000 mg | INJECTION | Freq: Two times a day (BID) | INTRAVENOUS | Status: DC
Start: 2023-02-27 — End: 2023-02-27

## 2023-02-27 MED ORDER — LORAZEPAM 1 MG TABLET
ORAL_TABLET | ORAL | Status: AC
Start: 2023-02-27 — End: 2023-02-27
  Filled 2023-02-27: qty 2

## 2023-02-27 MED ORDER — SODIUM CHLORIDE 0.9 % INTRAVENOUS PIGGYBACK
INJECTION | INTRAVENOUS | Status: AC
Start: 2023-02-27 — End: 2023-02-27
  Filled 2023-02-27: qty 100

## 2023-02-27 MED ORDER — KETOROLAC 30 MG/ML (1 ML) INJECTION SOLUTION
INTRAMUSCULAR | Status: AC
Start: 2023-02-27 — End: 2023-02-27
  Filled 2023-02-27: qty 1

## 2023-02-27 MED ORDER — SODIUM CHLORIDE 0.9 % INTRAVENOUS SOLUTION
INTRAVENOUS | Status: DC
Start: 2023-02-27 — End: 2023-02-27

## 2023-02-27 MED ORDER — MORPHINE 2 MG/ML INJECTION WRAPPER
INJECTION | INTRAMUSCULAR | Status: AC
Start: 2023-02-27 — End: 2023-02-27
  Filled 2023-02-27: qty 1

## 2023-02-27 MED ORDER — LINEZOLID IN 5% DEXTROSE IN WATER 600 MG/300 ML INTRAVENOUS PIGGYBACK
600.0000 mg | INJECTION | INTRAVENOUS | Status: AC
Start: 2023-02-27 — End: 2023-02-27
  Administered 2023-02-27: 0 mg via INTRAVENOUS
  Administered 2023-02-27: 600 mg via INTRAVENOUS

## 2023-02-27 NOTE — ED Triage Notes (Signed)
Pt reports left arm "knot" X 3 days. Hx of MRSA. First noticed it X 3 weeks worsened over the past 3 days . Pt reports redness and warmth

## 2023-02-27 NOTE — ED Nurses Note (Signed)
Called report to [facility] at [facility], verbalized understanding.

## 2023-02-27 NOTE — ED Nurses Note (Incomplete)
[facility] here to transport

## 2023-02-27 NOTE — ED Nurses Note (Signed)
Patient states she first noticed three knots under her arm three days ago, since then swelling has increased, with pain. Patient states that she has a history of MRSA. Swelling and redness noted to the arm.

## 2023-02-27 NOTE — ED Provider Notes (Signed)
[facility] Emergency Department  ED Primary Provider Note  HPI:  Michele Mason is a 47 y.o. female     Patient complains of redness swelling pain left axilla spreading into her breast.  It has been ongoing for proximally 3 weeks but has worsened dramatically in the last 3 days.  Denies fevers denies chills.  Areas extremely tender.  She does feel somewhat short of breath.  She has a history of MRSA a and lung infection which almost required surgical intervention.  [place].  Patient states he has used some old antibiotics to try to quality infection early on.  I did review outside records from [facility] which were contributory.  Note patient does have a history of gastric cancer now in remission.  She was not a smoker.  COVID vaccinated.  Full code.    ROS review and negative aside from stated in HPI.    Physical Exam:  ED Triage Vitals [02/27/23 0034]   BP (Non-Invasive) (!) 161/116   Heart Rate (!) 143   Respiratory Rate 20   Temperature (!) 38.3 C (101 F)   SpO2 98 %   Weight 63.5 kg (140 lb)   Height 1.575 m (5\' 2" )     No acute distress.  Patient awake alert oriented x3.  Patient appears anxious Pupils 3 mm equal round reactive.  Extraocular movements are intact.  Oropharynx is clear.  Mucous membranes moist.  Trachea midline.  Neck is supple.  Heart has regular rate and rhythm without significant murmurs rubs or gallops.  Lungs are clear to auscultation.  Abdomen soft nontender, nondistended.  Moving all extremities without difficulty.  Patient has a area of redness warmth induration left axilla extending into the breast.  Does not include nipple.  No breast discharge.  No fluctuance.      Patient data:  Labs Ordered/Reviewed   COMPREHENSIVE METABOLIC PANEL, NON-FASTING - Abnormal; Notable for the following components:       Result Value    BUN 24 (*)     CREATININE 1.23 (*)      ESTIMATED GFR 55 (*)     ALBUMIN 2.8 (*)     GLUCOSE 214 (*)     AST (SGOT) 48 (*)     OSMOLALITY, CALCULATED 294 (*)     All other components within normal limits    Narrative:     Estimated Glomerular Filtration Rate (eGFR) is calculated using the CKD-EPI (2021) equation, intended for patients 31 years of age and older. If gender is not documented or "unknown", there will be no eGFR calculation.   URINALYSIS, MACRO/MICRO - Abnormal; Notable for the following components:    GLUCOSE 100 (*)     BLOOD Trace (*)     All other components within normal limits   CBC WITH DIFF - Abnormal; Notable for the following components:    WBC 29.2 (*)     RBC 2.98 (*)     HGB 10.0 (*)     HCT 31.8 (*)     MCV 106.8 (*)     MCH 33.6 (*)     MCHC 31.4 (*)     RDW 15.9 (*)     All other components within normal limits   MANUAL DIFFERENTIAL - Abnormal; Notable for the following components:    LYMPHOCYTE % 13 (*)     NEUTROPHIL ABSOLUTE 24.24 (*)  All other components within normal limits    Narrative:     Smudge Cells 1+   URINALYSIS, MICROSCOPIC - Abnormal; Notable for the following components:    RBCS 3-5 (*)     BACTERIA Rare (*)     All other components within normal limits   COVID-19, FLU A/B, RSV RAPID BY PCR - Normal    Narrative:     Results are for the simultaneous qualitative identification of SARS-CoV-2 (formerly 2019-nCoV), Influenza A, Influenza B, and RSV RNA. These etiologic agents are generally detectable in nasopharyngeal and nasal swabs during the ACUTE PHASE of infection. Hence, this test is intended to be performed on respiratory specimens collected from individuals with signs and symptoms of upper respiratory tract infection who meet Centers for Disease Control and Prevention (CDC) clinical and/or epidemiological criteria for Coronavirus Disease 2019 (COVID-19) testing. CDC COVID-19 criteria for testing on human specimens is available at [facility] webpage information for Healthcare Professionals: Coronavirus  Disease 2019 (COVID-19) ([email or web address]).     False-negative results may occur if the virus has genomic mutations, insertions, deletions, or rearrangements or if performed very early in the course of illness. Otherwise, negative results indicate virus specific RNA targets are not detected, however negative results do not preclude SARS-CoV-2 infection/COVID-19, Influenza, or Respiratory syncytial virus infection. Results should not be used as the sole basis for patient management decisions. Negative results must be combined with clinical observations, patient history, and epidemiological information. If upper respiratory tract infection is still suspected based on exposure history together with other clinical findings, re-testing should be considered.    Test methodology:   Cepheid Xpert Xpress SARS-CoV-2/Flu/RSV Assay real-time polymerase chain reaction (RT-PCR) test on the GeneXpert Dx and Xpert Xpress systems.   LACTIC ACID LEVEL W/ REFLEX FOR LEVEL >2.0 - Normal   TROPONIN-I - Normal    Narrative:     Values received on females ranging between 12-15 ng/L MUST include the next serial troponin to review changes in the delta differences as the reference range for the Access II chemistry analyzer is lower than the established reference range.     ADULT ROUTINE BLOOD CULTURE, SET OF 2 BOTTLES (BACTERIA AND YEAST)   ADULT ROUTINE BLOOD CULTURE, SET OF 2 BOTTLES (BACTERIA AND YEAST)   URINALYSIS WITH REFLEX MICROSCOPIC AND CULTURE IF POSITIVE    Narrative:     The following orders were created for panel order URINALYSIS WITH REFLEX MICROSCOPIC AND CULTURE IF POSITIVE.  Procedure                               Abnormality         Status                     ---------                               -----------         ------                     URINALYSIS, MACRO/MICRO[[id number]]      Abnormal            Final result               URINALYSIS, MICROSCOPIC[[id number]]      Abnormal             Final  result                 Please view results for these tests on the individual orders.   CBC/DIFF    Narrative:     The following orders were created for panel order CBC/DIFF.  Procedure                               Abnormality         Status                     ---------                               -----------         ------                     CBC WITH AOZH[[id number]]                Abnormal            Final result               MANUAL DIFFERENTIAL[[id number]]          Abnormal            Final result                 Please view results for these tests on the individual orders.   SEDIMENTATION RATE     CT CHEST ABDOMEN PELVIS W IV CONTRAST   Final Result by Edi, Radresults In (09/15 0606)   1. LEFT UPPER LOBE PNEUMONIA RECOMMEND IMAGING FOLLOW-UP UNTIL COMPLETE RESOLUTION   2. EXTENSIVE SOFT TISSUE STRANDING AND SOFT TISSUE EDEMA IN THE LEFT AXILLA AND LEFT BREAST AND CHEST WALL   3. NO ACUTE FINDINGS IN THE ABDOMEN OR PELVIS          One or more dose reduction techniques were used (e.g., Automated exposure control, adjustment of the mA and/or kV according to patient size, use of iterative reconstruction technique).         Radiologist location ID: [id number]         XR AP MOBILE CHEST   Final Result by Edi, Radresults In (09/15 0116)   NEGATIVE CHEST            Radiologist location ID: [id number]             MDM:    Redness swelling left axilla.  Concern for sepsis cellulitis possible pneumonia abscess.  Will evaluate for possible necrotizing component.  Patient febrile tachycardic blood pressure 161/116.  Lab work notable for leukocytosis of 29,000.  Creatinine is 1.23.  Glucose is 214.  Contrasted CT study of the chest abdomen pelvis showed left upper lobe pneumonia with extensive soft tissue stranding and edema of the axilla left breast and chest wall.  Notably there was involvement of the lateral aspect of the left pectoralis muscle along the left chest wall.  No fluid collection appreciated.   I did review these findings with the patient and answered her questions.  Patient given sepsis protocol fluid bolus of 30 milliliters/kilogram with some improvement of tachycardia.  She was started on linezolid and cefepime.  Pain was adequately controlled.  Patient was reassessed frequently.       ED Course as of 02/27/23 0347   [facility] Feb 27, 2023   0734 I did speak with Dr. Hilaria Ota hospitalist who recommended transfer to a tertiary care center with CT surgery services available given possible involvement of pectoralis muscle on patient's CT.  [facility] also has extremely limited bed availability.  Patient is in agreement with this plan of care.  She was a strong preference for going to [facility] as it is in the direction of home.  End of shift.  All pertinent details will be signed out to oncoming provider   [phone] Patient is wait listed at [facility].  Team will continue attempts to transfer.   9604 Case discussed with CT surgery Julieanne Manson Dr. Ladona Ridgel he was agreeable for transfer.  Dr. Alvester Morin ED we will be the accepting provider.      Admitted  Clinical Impression   Cellulitis, unspecified cellulitis site (Primary)   Sepsis, due to unspecified organism, unspecified whether acute organ dysfunction present (CMS HCC)   Pneumonia     Medications Administered in the ED   linezolid (ZYVOX) 600 mg in iso-osmotic 300 mL premix IVPB (has no administration in time range)   cefepime (MAXIPIME) 2 g in NS 100 mL IVPB minibag (has no administration in time range)   LR bolus infusion 30 mL/kg = 1,905 mL (1,905 mL Intravenous New Bag/New Syringe 02/27/23 0200)   acetaminophen (TYLENOL) tablet (975 mg Oral Given 02/27/23 0124)   morphine 2 mg/mL injection (2 mg Intravenous Given 02/27/23 0139)   linezolid (ZYVOX) 600 mg in iso-osmotic 300 mL premix IVPB (has no administration in time range)   cefepime (MAXIPIME) 2 g in NS 100 mL IVPB minibag (0 g Intravenous Stopped 02/27/23 0202)   iohexol (OMNIPAQUE 350) infusion (100 mL  Intravenous Given 02/27/23 0230)        Current Discharge Medication List        CONTINUE these medications - NO CHANGES were made during your visit.        Details   lisinopriL 20 mg Tablet  Commonly known as: PRINIVIL   40 mg, Oral, DAILY  Refills: 0

## 2023-02-27 NOTE — ED Nurses Note (Addendum)
Pt c/o increased pain to left breast. Requesting medication for pain relief. Rates pain 100. Dr Katrinka Blazing notified. Orders pending.

## 2023-02-27 NOTE — ED Nurses Note (Signed)
Pt c/o increased anxiety and vomiting in the restroom. Pt temp 100F. Dr Katrinka Blazing notified. Orders pending.

## 2023-02-28 ENCOUNTER — Encounter: Payer: Self-pay | Admitting: Adult Health

## 2023-03-04 LAB — ADULT ROUTINE BLOOD CULTURE, SET OF 2 BOTTLES (BACTERIA AND YEAST)
BLOOD CULTURE, ROUTINE: ABNORMAL — CR
BLOOD CULTURE, ROUTINE: NO GROWTH

## 2023-04-09 ENCOUNTER — Other Ambulatory Visit: Payer: Self-pay | Admitting: Student

## 2023-04-09 DIAGNOSIS — F32A Depression, unspecified: Secondary | ICD-10-CM

## 2023-04-09 DIAGNOSIS — R0789 Other chest pain: Secondary | ICD-10-CM

## 2023-04-26 ENCOUNTER — Telehealth: Payer: Self-pay | Admitting: Primary Care

## 2023-04-26 ENCOUNTER — Ambulatory Visit (INDEPENDENT_AMBULATORY_CARE_PROVIDER_SITE_OTHER): Payer: BLUE CROSS/BLUE SHIELD | Admitting: Primary Care

## 2023-04-26 ENCOUNTER — Encounter: Payer: Self-pay | Admitting: Primary Care

## 2023-04-26 VITALS — BP 126/88 | HR 95 | Ht 61.0 in | Wt 139.2 lb

## 2023-04-26 DIAGNOSIS — D869 Sarcoidosis, unspecified: Secondary | ICD-10-CM | POA: Diagnosis not present

## 2023-04-26 DIAGNOSIS — Z0000 Encounter for general adult medical examination without abnormal findings: Secondary | ICD-10-CM | POA: Diagnosis not present

## 2023-04-26 DIAGNOSIS — N6323 Unspecified lump in the left breast, lower outer quadrant: Secondary | ICD-10-CM

## 2023-04-26 LAB — HIGH SENSITIVITY CRP: CRP, High Sensitivity: 13.64 mg/L — ABNORMAL HIGH (ref 0.000–5.000)

## 2023-04-26 LAB — SEDIMENTATION RATE: Sed Rate: 57 mm/h — ABNORMAL HIGH (ref 0–20)

## 2023-04-26 MED ORDER — SULFAMETHOXAZOLE-TRIMETHOPRIM 800-160 MG PO TABS: ORAL_TABLET | ORAL | 1 refills | Status: DC

## 2023-04-26 MED ORDER — PREDNISONE 20 MG PO TABS: ORAL_TABLET | ORAL | 0 refills | Status: DC

## 2023-04-26 MED ORDER — BUDESONIDE-FORMOTEROL FUMARATE 160-4.5 MCG/ACT IN AERO: 2.0000 | INHALATION_SPRAY | Freq: Two times a day (BID) | RESPIRATORY_TRACT | 2 refills | Status: DC

## 2023-04-26 MED ORDER — ALBUTEROL SULFATE HFA 108 (90 BASE) MCG/ACT IN AERS: 1.0000 | INHALATION_SPRAY | Freq: Four times a day (QID) | RESPIRATORY_TRACT | 3 refills | Status: AC | PRN

## 2023-04-26 NOTE — Telephone Encounter (Signed)
ATC patient x1.  Left detailed message (DPR) letting her know that her prescriptions have been sent to her pharmacy.  Advised to return call with any questions.

## 2023-04-26 NOTE — Telephone Encounter (Signed)
Patient checking on the RX for Albuterol inhaler and Bactrim. Pharmacy is [facility]. Patient phone number is [phone].

## 2023-04-26 NOTE — Progress Notes (Signed)
@Patient  ID: Kristina Graves, female    DOB: 1976-02-05, 47 y.o.   MRN: [id number]  Chief Complaint  Patient presents with   Follow-up    Referring provider: Annett Fabian, MD  HPI: 47 year old female, never smoked.  Past medical history significant for sarcoidosis, Crohn's, history of gastric cancer.  04/26/2023 Discussed the use of AI scribe software for clinical note transcription with the patient, who gave verbal consent to proceed.  History of Present Illness   Patient was seen by Dr. Wynona Neat on April 15, 2022 for shortness of breath. She has a history of sarcoidosis. During that visit she was started on prednisone 30 mg with plan to wean to low-dose as tolerated over the following 4 to 6 weeks.  She was referred to rheumatology, ordered ESR/CRP and pulmonary function testing.  Recommended to use Symbicort twice daily.  Patient presents today for overdue follow-up.  She reports persistent respiratory symptoms and chest pain. Symptoms have worsened since November 2023, including difficulty breathing and severe pain when lying on her left side. The patient has been on prednisone for most of her life, but has not had any for the past two weeks.  In January 2024, the patient was hospitalized for nearly two months following a severe respiratory illness. She was diagnosed with flu, pneumonia, and MRSA, and sepsis. During this hospitalization, she underwent lung surgery, which involved the placement of chest tubes. The patient describes this as one of her worst hospitalizations, during which she nearly lost her life.  In September 2024, the patient was hospitalized again for pneumonia and sepsis. She also noticed large knots under her arms and breasts. The patient has never had pneumonia before these two instances.  The patient reports that she has been experiencing flare-ups of what she believes to be sarcoidosis, particularly in the winter. She was unaware that she had sarcoidosis in  her lungs until 2014 when she was diagnosed with gastric cancer. She has been told for years that she had bronchitis and asthma.  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 She reports difficulty with the amount of walking and climbing stairs required for her job due to her breathing difficulties. She is seeking further testing and treatment to address her ongoing respiratory issues and chest pain.      Allergies  Allergen Reactions   Tape Other (See Comments)     There is no immunization history on file for this patient.  Past Medical History:  Diagnosis Date   Cancer (HCC)    stomach    Crohn disease (HCC)    Essential hypertension 03/18/2022   PVNS (pigmented villonodular synovitis)    Sarcoidosis     Tobacco History: Social History   Tobacco Use  Smoking Status Never  Smokeless Tobacco Never   Counseling given: Not Answered   Outpatient Medications Prior to Visit  Medication Sig Dispense Refill   lisinopril (ZESTRIL) 10 MG tablet Take 1 tablet by mouth daily.     predniSONE (DELTASONE) 10 MG tablet Take 1 tablet (10 mg total) by mouth daily with breakfast. 30 tablet 1   DULoxetine (CYMBALTA) 30 MG capsule TAKE 1 CAPSULE(30 MG) BY MOUTH DAILY 30 capsule 2   albuterol (VENTOLIN HFA) 108 (90 Base) MCG/ACT inhaler Inhale 1-2 puffs into the lungs every 6 (six) hours as needed for wheezing or shortness of breath. (Patient not taking: Reported on 04/26/2023) 18 g 0   budesonide-formoterol (SYMBICORT) 160-4.5  MCG/ACT inhaler Inhale 2 puffs into the lungs 2 (two) times daily. (Patient not taking: Reported on 04/26/2023) 1 each 5   fluticasone furoate-vilanterol (BREO ELLIPTA) 200-25 MCG/ACT AEPB Inhale 1 puff into the lungs daily. (Patient not taking: Reported on 04/26/2023) 60 each 3   HYDROcodone-acetaminophen (NORCO/VICODIN) 5-325 MG tablet Take 1 tablet by mouth every 8 (eight) hours as needed. 15 tablet 0    No facility-administered medications prior to visit.    Review of Systems  Review of Systems  Constitutional:  Positive for fatigue.  HENT: Negative.    Respiratory:  Positive for cough and shortness of breath.   Cardiovascular:  Positive for chest pain.     Physical Exam  BP 126/88 (BP Location: Left Arm, Cuff Size: Normal)   Pulse 95   Ht 5\' 1"  (1.549 m)   Wt 139 lb 3.2 oz (63.1 kg)   SpO2 99%   BMI 26.30 kg/m  Physical Exam Constitutional:      Appearance: Normal appearance.  HENT:     Head: Normocephalic and atraumatic.  Cardiovascular:     Rate and Rhythm: Normal rate and regular rhythm.  Pulmonary:     Effort: Pulmonary effort is normal.     Breath sounds: Wheezing present. No rhonchi.  Musculoskeletal:     Comments: Lump palpated to left breast in axillary region   Skin:    General: Skin is warm and dry.  Neurological:     General: No focal deficit present.     Mental Status: She is alert and oriented to person, place, and time. Mental status is at baseline.  Psychiatric:        Behavior: Behavior normal.        Thought Content: Thought content normal.        Judgment: Judgment normal.     Comments: Teary-eyed at times      Lab Results:  CBC    Component Value Date/Time   WBC 18.5 (H) 03/18/2022 1632   WBC 15.8 (H) 03/16/2022 2115   RBC 3.32 (L) 03/18/2022 1632   RBC 3.19 (L) 03/16/2022 2115   HGB 10.8 (L) 03/18/2022 1632   HCT 32.6 (L) 03/18/2022 1632   PLT 325 03/18/2022 1632   MCV 98 (H) 03/18/2022 1632   MCH 32.5 03/18/2022 1632   MCH 32.3 03/16/2022 2115   MCHC 33.1 03/18/2022 1632   MCHC 32.6 03/16/2022 2115   RDW 13.5 03/18/2022 1632   LYMPHSABS 0.4 (L) 03/18/2022 1632   MONOABS 1.0 03/16/2022 2115   EOSABS 0.0 03/18/2022 1632   BASOSABS 0.0 03/18/2022 1632    BMET    Component Value Date/Time   NA 141 03/18/2022 1629   K 5.0 03/18/2022 1629   CL 104 03/18/2022 1629   CO2 23 03/18/2022 1629   GLUCOSE 199 (H) 03/18/2022  1629   GLUCOSE 76 03/16/2022 2115   BUN 28 (H) 03/18/2022 1629   CREATININE 1.06 (H) 03/18/2022 1629   CALCIUM 9.8 03/18/2022 1629   GFRNONAA >60 03/16/2022 2115   GFRAA >60 02/25/2020 2351    BNP No results found for: "BNP"  ProBNP No results found for: "PROBNP"  Imaging: No results found.   Assessment & Plan:   1. Sarcoidosis - CT CHEST HIGH RESOLUTION; Future - Angiotensin converting enzyme - Ambulatory referral to Rheumatology - CBC with Differential; Future - CRP High sensitivity; Future - Sedimentation rate; Future - Ambulatory referral to Ophthalmology - Ambulatory referral to Internal Medicine - Sedimentation rate - CBC with Differential -  CRP High sensitivity  2. Lump in lower outer quadrant of left breast - MM 3D DIAGNOSTIC MAMMOGRAM BILATERAL BREAST; Future - Ambulatory referral to Internal Medicine  3. Healthcare maintenance - Ambulatory referral to Internal Medicine  Sarcoidosis Worsening symptoms of chest pain and shortness of breath, suggestive of a possible sarcoid flare. History of sarcoidosis since childhood, with recent hospitalizations for pneumonia and sepsis. -Order high-resolution CT scan of the chest to assess for active sarcoidosis. -Schedule pulmonary function tests. -Start Prednisone therapy 30mg  daily and plan to taper as tolerated, pending results of imaging and labs. -Order ACE level to monitor disease activity. -Continue Symbicort twice daily and Albuterol as needed. -Refer to Rheumatology  -Start prophylactic Bactrim while on prednisone dose >20mg    Pneumonia Recent hospitalizations for pneumonia and sepsis, with ongoing chest pain and difficulty breathing. -CT scan of the chest to assess for residual pneumonia.  Left breast lump - Ordered for diagnostic mammogram - Refer internal medicine for routine health care maintenance   Follow-up Plan for follow-up with Dr. Wynona Neat after completion of imaging and pulmonary  function tests.     60 mins spent on case; > 50%face to face with patient   Glenford Bayley, NP 04/26/2023

## 2023-04-26 NOTE — Patient Instructions (Addendum)
Recommendations: Start prednisone 30mg  daily x 4 weeks; then taper to 20mg  and stay on this dose until follow-up  Start Bactrim DS while on prednisone dose >20mg  to prevent oppurtunistic pneumonia   Orders: Hight resolution CT chest Pulmonary function testing Labs today (ACE level, sed rate, CRP, CBC)  Mammogram re: left breast lump   Refer: Rheumatology Internal medicine/ primary care  Ophthalmology   Follow-up First available with Dr. Wynona Neat in 4-8 weeks with PFT 1 hour (needs to be with him)  Sarcoidosis  Sarcoidosis is a disease that can cause inflammation in many areas of the body. It most often affects the lungs (pulmonary sarcoidosis). Sarcoidosis can also affect the lymph nodes, liver, eyes, skin, heart, or any other body tissue. Normally, cells that are part of the body's disease-fighting system (immune system) attack harmful substances in the body, such as germs. This immune system response causes inflammation. After the harmful substance is destroyed, the inflammation goes away. When you have sarcoidosis, your immune system causes inflammation even when there are no harmful substances, and the inflammation does not go away. Sarcoidosis also causes cells from your immune system to form small lumps (granulomas) in the affected area of your body. What are the causes? The exact cause of sarcoidosis is not known.  If you have a family history of this disease (genetic predisposition), the immune system response that leads to inflammation may be triggered by something in your environment, such as: Bacteria or viruses. Metals. Chemicals. Dust. Mold or mildew. What increases the risk? You may be more likely to develop this condition if you: Have a family history of the disease. Are African American. Are of Northern European descent. Are 66-52 years old. Are female. Work as a IT sales professional. Work in an environment where you are exposed to metals, chemicals, mold or mildew, or  insecticides. What are the signs or symptoms? Some people with sarcoidosis have no symptoms. Others have very mild symptoms. The symptoms usually depend on the organ that is affected. Sarcoidosis most often affects the lungs, which may lead to symptoms such as: Chest pain. Coughing. Wheezing. Shortness of breath. Other common symptoms include: Night sweats. Fever. Weight loss. Tiredness (fatigue). Swollen lymph nodes. Joint pain. How is this diagnosed? This condition may be diagnosed based on: Your symptoms and medical history. A physical exam. Imaging tests such as: Chest X-ray. CT scan. MRI. PET scan. Lung function tests. These tests evaluate your breathing and check for problems that may be related to sarcoidosis. A procedure to remove a tissue sample for testing (biopsy). You may have a biopsy of lung tissue if that is where you are having symptoms. You may have tests to check for any complications of the condition. These tests may include: Eye exams. MRI of the heart or brain. Echocardiogram. ECG (electrocardiogram). How is this treated? In some cases, sarcoidosis does not require a specific treatment because it causes no symptoms or only mild symptoms. If your symptoms bother you or are severe, you may be prescribed medicines to reduce inflammation or relieve symptoms. These medicines may include: Prednisone. This is a steroid that reduces inflammation related to sarcoidosis. Hydroxychloroquine. This may be used to treat sarcoidosis that affects the skin, eyes, or brain. Certain medicines that affect the immune system. These can help with sarcoidosis in the joints, eyes, skin, or lungs. Medicines that you breathe in (inhalers). Inhalers can help you breathe if sarcoidosis affects your lungs. Follow these instructions at home:  Do not use any products that contain  nicotine or tobacco. These products include cigarettes, chewing tobacco, and vaping devices, such as  e-cigarettes. If you need help quitting, ask your health care provider. Avoid secondhand smoke and irritating dust or chemicals. Stay indoors on days when air quality is poor in your area. Return to your normal activities as told by your health care provider. Ask your health care provider what activities are safe for you. Take or use over-the-counter and prescription medicines only as told by your health care provider. Keep all follow-up visits. This is important. Where to find more information National Heart, Lung, and Blood Institute: [employer]is Contact a health care provider if: You have vision problems. You have a dry cough that does not go away. You have an irregular heartbeat. You have pain or aches in your joints, hands, or feet. You have an unexplained rash. Get help right away if: You have chest pain. You have trouble breathing. These symptoms may represent a serious problem that is an emergency. Do not wait to see if the symptoms will go away. Get medical help right away. Call your local emergency services (911 in the [place] Do not drive yourself to the hospital. Summary Sarcoidosis is a disease that can cause inflammation in many body areas of the body. It most often affects the lungs (pulmonary sarcoidosis). It can also affect the lymph nodes, liver, eyes, skin, heart, or any other body tissue. When you have sarcoidosis, cells from your immune system form small lumps (granulomas) in the affected area of your body. Sarcoidosis sometimes does not require a specific treatment because it causes no symptoms or only mild symptoms. If your symptoms bother you or are severe, you may be prescribed medicines to reduce inflammation or relieve symptoms. This information is not intended to replace advice given to you by your health care provider. Make sure you discuss any questions you have with your health care provider. Document Revised: 04/01/2020 Document Reviewed:  04/01/2020 Elsevier Patient Education  2024 [employer].

## 2023-04-27 ENCOUNTER — Other Ambulatory Visit: Payer: Self-pay | Admitting: Primary Care

## 2023-04-27 ENCOUNTER — Telehealth: Payer: Self-pay | Admitting: Primary Care

## 2023-04-27 DIAGNOSIS — N6320 Unspecified lump in the left breast, unspecified quadrant: Secondary | ICD-10-CM

## 2023-04-27 LAB — CBC WITH DIFFERENTIAL/PLATELET
Basophils Absolute: 0 10*3/uL (ref 0.0–0.1)
Basophils Relative: 0.5 % (ref 0.0–3.0)
Eosinophils Absolute: 0.1 10*3/uL (ref 0.0–0.7)
Eosinophils Relative: 2.2 % (ref 0.0–5.0)
HCT: 37.7 % (ref 36.0–46.0)
Hemoglobin: 11.7 g/dL — ABNORMAL LOW (ref 12.0–15.0)
Lymphocytes Relative: 28.3 % (ref 12.0–46.0)
Lymphs Abs: 1.9 10*3/uL (ref 0.7–4.0)
MCHC: 31.1 g/dL (ref 30.0–36.0)
MCV: 104.7 fL — ABNORMAL HIGH (ref 78.0–100.0)
Monocytes Absolute: 0.9 10*3/uL (ref 0.1–1.0)
Monocytes Relative: 12.9 % — ABNORMAL HIGH (ref 3.0–12.0)
Neutro Abs: 3.7 10*3/uL (ref 1.4–7.7)
Neutrophils Relative %: 56.1 % (ref 43.0–77.0)
Platelets: 362 10*3/uL (ref 150.0–400.0)
RBC: 3.6 Mil/uL — ABNORMAL LOW (ref 3.87–5.11)
RDW: 15.4 % (ref 11.5–15.5)
WBC: 6.6 10*3/uL (ref 4.0–10.5)

## 2023-04-27 LAB — ANGIOTENSIN CONVERTING ENZYME: Angiotensin-Converting Enzyme: 17 U/L (ref 9–67)

## 2023-04-27 NOTE — Telephone Encounter (Signed)
Patient called back. Patient states she has picked up prednisone, Symbicort and albuterol, but did not get Bactrim.  Per OV, Bactrim was sent in EPIC.  Called [facility].  Rx for Bactrim was filled yesterday and is ready for pickup.  Called patient and informed of information.  Patient verbalized understanding and will pick up medication.  Nothing further needed at this time.

## 2023-04-28 ENCOUNTER — Other Ambulatory Visit: Payer: Self-pay | Admitting: Primary Care

## 2023-04-28 NOTE — Telephone Encounter (Signed)
Patient checking on message for medications. States is having difficulty breathing. Patient phone number is [phone].
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
## 2023-04-28 NOTE — Telephone Encounter (Signed)
Lm x1 for patient.  

## 2023-04-29 MED ORDER — BUDESONIDE-FORMOTEROL FUMARATE 160-4.5 MCG/ACT IN AERO: 2.0000 | INHALATION_SPRAY | Freq: Two times a day (BID) | RESPIRATORY_TRACT | 5 refills | Status: DC

## 2023-04-29 NOTE — Telephone Encounter (Signed)
Called pt no answer, lvmm. Sent in pts symbicort. Pt has been contacted twice. I will close encounter

## 2023-05-02 ENCOUNTER — Other Ambulatory Visit: Payer: Self-pay | Admitting: Primary Care

## 2023-05-03 ENCOUNTER — Telehealth: Payer: Self-pay | Admitting: Primary Care

## 2023-05-03 MED ORDER — PREDNISONE 20 MG PO TABS: ORAL_TABLET | ORAL | 0 refills | Status: AC
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
## 2023-05-03 NOTE — Telephone Encounter (Signed)
Sent RX

## 2023-05-03 NOTE — Telephone Encounter (Signed)
Patient checking on Prednisone. Pharmacy is  [facility]. Patient phone number is [phone].

## 2023-05-03 NOTE — Telephone Encounter (Signed)
Patient needs a refill for her Prednisone sent to her pharmacy.

## 2023-05-06 ENCOUNTER — Ambulatory Visit (HOSPITAL_COMMUNITY): Payer: BLUE CROSS/BLUE SHIELD

## 2023-05-09 NOTE — Telephone Encounter (Signed)
Per chart this has already been done.

## 2023-05-18 NOTE — Addendum Note (Signed)
Addended by: Katheran James on: 05/18/2023 02:20 PM   Modules accepted: Orders

## 2023-05-22 ENCOUNTER — Encounter (HOSPITAL_BASED_OUTPATIENT_CLINIC_OR_DEPARTMENT_OTHER): Payer: Self-pay | Admitting: Emergency Medicine

## 2023-05-22 ENCOUNTER — Observation Stay (HOSPITAL_BASED_OUTPATIENT_CLINIC_OR_DEPARTMENT_OTHER)
Admission: EM | Admit: 2023-05-22 | Discharge: 2023-05-24 | Disposition: A | Discharge status: Home / Self Care | Payer: BLUE CROSS/BLUE SHIELD | Attending: Internal Medicine | Admitting: Internal Medicine

## 2023-05-22 ENCOUNTER — Emergency Department (HOSPITAL_BASED_OUTPATIENT_CLINIC_OR_DEPARTMENT_OTHER): Payer: BLUE CROSS/BLUE SHIELD

## 2023-05-22 ENCOUNTER — Other Ambulatory Visit: Payer: Self-pay

## 2023-05-22 DIAGNOSIS — H9201 Otalgia, right ear: Secondary | ICD-10-CM | POA: Diagnosis present

## 2023-05-22 DIAGNOSIS — Z85028 Personal history of other malignant neoplasm of stomach: Secondary | ICD-10-CM | POA: Insufficient documentation

## 2023-05-22 DIAGNOSIS — I10 Essential (primary) hypertension: Secondary | ICD-10-CM | POA: Insufficient documentation

## 2023-05-22 DIAGNOSIS — H6011 Cellulitis of right external ear: Principal | ICD-10-CM | POA: Diagnosis present

## 2023-05-22 DIAGNOSIS — E875 Hyperkalemia: Secondary | ICD-10-CM | POA: Diagnosis not present

## 2023-05-22 DIAGNOSIS — H6010 Cellulitis of external ear, unspecified ear: Secondary | ICD-10-CM | POA: Diagnosis present

## 2023-05-22 DIAGNOSIS — Z79899 Other long term (current) drug therapy: Secondary | ICD-10-CM | POA: Insufficient documentation

## 2023-05-22 DIAGNOSIS — N6320 Unspecified lump in the left breast, unspecified quadrant: Secondary | ICD-10-CM | POA: Diagnosis not present

## 2023-05-22 DIAGNOSIS — K311 Adult hypertrophic pyloric stenosis: Secondary | ICD-10-CM | POA: Diagnosis not present

## 2023-05-22 DIAGNOSIS — D638 Anemia in other chronic diseases classified elsewhere: Secondary | ICD-10-CM | POA: Insufficient documentation

## 2023-05-22 DIAGNOSIS — K1120 Sialoadenitis, unspecified: Secondary | ICD-10-CM | POA: Diagnosis not present

## 2023-05-22 DIAGNOSIS — L03211 Cellulitis of face: Principal | ICD-10-CM

## 2023-05-22 DIAGNOSIS — N179 Acute kidney failure, unspecified: Secondary | ICD-10-CM | POA: Insufficient documentation

## 2023-05-22 DIAGNOSIS — H60311 Diffuse otitis externa, right ear: Secondary | ICD-10-CM | POA: Insufficient documentation

## 2023-05-22 DIAGNOSIS — R1032 Left lower quadrant pain: Secondary | ICD-10-CM | POA: Diagnosis not present

## 2023-05-22 LAB — CBC WITH DIFFERENTIAL/PLATELET
Abs Immature Granulocytes: 0.14 10*3/uL — ABNORMAL HIGH (ref 0.00–0.07)
Basophils Absolute: 0 10*3/uL (ref 0.0–0.1)
Basophils Relative: 0 %
Eosinophils Absolute: 0 10*3/uL (ref 0.0–0.5)
Eosinophils Relative: 0 %
HCT: 39.4 % (ref 36.0–46.0)
Hemoglobin: 12.2 g/dL (ref 12.0–15.0)
Immature Granulocytes: 1 %
Lymphocytes Relative: 3 %
Lymphs Abs: 0.8 10*3/uL (ref 0.7–4.0)
MCH: 32 pg (ref 26.0–34.0)
MCHC: 31 g/dL (ref 30.0–36.0)
MCV: 103.4 fL — ABNORMAL HIGH (ref 80.0–100.0)
Monocytes Absolute: 0.6 10*3/uL (ref 0.1–1.0)
Monocytes Relative: 2 %
Neutro Abs: 22.1 10*3/uL — ABNORMAL HIGH (ref 1.7–7.7)
Neutrophils Relative %: 94 %
Platelets: 316 10*3/uL (ref 150–400)
RBC: 3.81 MIL/uL — ABNORMAL LOW (ref 3.87–5.11)
RDW: 14.8 % (ref 11.5–15.5)
WBC: 23.5 10*3/uL — ABNORMAL HIGH (ref 4.0–10.5)
nRBC: 0 % (ref 0.0–0.2)

## 2023-05-22 LAB — URINALYSIS, ROUTINE W REFLEX MICROSCOPIC
Bilirubin Urine: NEGATIVE
Glucose, UA: NEGATIVE mg/dL
Hgb urine dipstick: NEGATIVE
Ketones, ur: NEGATIVE mg/dL
Leukocytes,Ua: NEGATIVE
Nitrite: NEGATIVE
Protein, ur: NEGATIVE mg/dL
Specific Gravity, Urine: 1.02 (ref 1.005–1.030)
pH: 6 (ref 5.0–8.0)

## 2023-05-22 LAB — TROPONIN I (HIGH SENSITIVITY): Troponin I (High Sensitivity): 5 ng/L (ref <18)

## 2023-05-22 LAB — COMPREHENSIVE METABOLIC PANEL
ALT: 13 U/L (ref 0–44)
AST: 18 U/L (ref 15–41)
Albumin: 3.4 g/dL — ABNORMAL LOW (ref 3.5–5.0)
Alkaline Phosphatase: 48 U/L (ref 38–126)
Anion gap: 4 — ABNORMAL LOW (ref 5–15)
BUN: 36 mg/dL — ABNORMAL HIGH (ref 6–20)
CO2: 22 mmol/L (ref 22–32)
Calcium: 9.5 mg/dL (ref 8.9–10.3)
Chloride: 108 mmol/L (ref 98–111)
Creatinine, Ser: 1.13 mg/dL — ABNORMAL HIGH (ref 0.44–1.00)
GFR, Estimated: >60 mL/min (ref >60)
Glucose, Bld: 129 mg/dL — ABNORMAL HIGH (ref 70–99)
Potassium: 5 mmol/L (ref 3.5–5.1)
Sodium: 134 mmol/L — ABNORMAL LOW (ref 135–145)
Total Bilirubin: 0.7 mg/dL (ref <1.2)
Total Protein: 6.8 g/dL (ref 6.5–8.1)

## 2023-05-22 LAB — LACTIC ACID, PLASMA: Lactic Acid, Venous: 0.9 mmol/L (ref 0.5–1.9)

## 2023-05-22 LAB — LIPASE, BLOOD: Lipase: 21 U/L (ref 11–51)

## 2023-05-22 LAB — PREGNANCY, URINE: Preg Test, Ur: NEGATIVE

## 2023-05-22 MED ORDER — PROMETHAZINE HCL 25 MG/ML IJ SOLN
INTRAMUSCULAR | Status: AC
  Filled 2023-05-22: qty 1

## 2023-05-22 MED ORDER — SODIUM CHLORIDE 0.9 % IV SOLN
INTRAVENOUS | Status: AC | PRN
  Administered 2023-05-22 – 2023-05-23 (×3): 1000 mL via INTRAVENOUS

## 2023-05-22 MED ORDER — MORPHINE SULFATE (PF) 4 MG/ML IV SOLN
4.0000 mg | Freq: Once | INTRAVENOUS | Status: AC
  Administered 2023-05-22: 4 mg via INTRAVENOUS
  Filled 2023-05-22: qty 1

## 2023-05-22 MED ORDER — ONDANSETRON HCL 4 MG/2ML IJ SOLN
4.0000 mg | Freq: Once | INTRAMUSCULAR | Status: AC
  Administered 2023-05-22: 4 mg via INTRAVENOUS
  Filled 2023-05-22: qty 2

## 2023-05-22 MED ORDER — SODIUM CHLORIDE 0.9 % IV SOLN
12.5000 mg | Freq: Four times a day (QID) | INTRAVENOUS | Status: DC | PRN
  Administered 2023-05-22 – 2023-05-24 (×4): 12.5 mg via INTRAVENOUS
  Filled 2023-05-22 (×2): qty 12.5
  Filled 2023-05-22: qty 0.5
  Filled 2023-05-22: qty 12.5

## 2023-05-22 NOTE — ED Triage Notes (Signed)
Pt c/o RT ear pain and swelling to ear and surrounding area; also reports she vomits "all the time" x 3 wks; sts she wakes up and her bed is filled with vomit sometimes

## 2023-05-22 NOTE — ED Provider Notes (Signed)
EMERGENCY DEPARTMENT AT [facility] Provider Note   CSN: [id number] Arrival date & time: 05/22/23  2011     History  Chief Complaint  Patient presents with   Facial Swelling   Otalgia    Kristina Graves is a 47 y.o. female.   Otalgia Associated symptoms: hearing loss and vomiting   Patient is the ED with boyfriend complaining of 3 weeks of ear pain and nausea and vomiting.  Previous history of sarcoidosis, gastric cancer, Crohn's.  Was previously on prednisone and taken off but just started again on 11/19.  States she wakes up every night in her own vomit or vomiting for the last 3 weeks, endorses left lower quadrant abdominal pain.  States she had normal movements morning. Says she has right-sided hearing loss that started today after she used "sweet oil" and a Q-tip in her ear last night.  Right ear swelling that began this morning. Denies fever, chest pain, diarrhea, constipation, dysuria, hematuria.   Had previous CT on 05/18/2023 of her chest.     Home Medications Prior to Admission medications   Medication Sig Start Date End Date Taking? Authorizing Provider  albuterol (VENTOLIN HFA) 108 (90 Base) MCG/ACT inhaler Inhale 1-2 puffs into the lungs every 6 (six) hours as needed for wheezing or shortness of breath. 04/26/23   Glenford Bayley, NP  budesonide-formoterol [facility]) 160-4.5 MCG/ACT inhaler Inhale 2 puffs into the lungs 2 (two) times daily. 04/29/23   Glenford Bayley, NP  fluticasone furoate-vilanterol (BREO ELLIPTA) 200-25 MCG/ACT AEPB Inhale 1 puff into the lungs daily. Patient not taking: Reported on 04/26/2023 04/21/22   Virl Diamond A, MD  lisinopril (ZESTRIL) 10 MG tablet Take 1 tablet by mouth daily. 12/22/22 12/22/23  [provider]  predniSONE (DELTASONE) 20 MG tablet Take 1.5 tablets (30 mg total) by mouth daily with breakfast for 28 days, THEN 1 tablet (20 mg total) daily with breakfast for 28 days. 05/03/23 06/28/23   Glenford Bayley, NP  sulfamethoxazole-trimethoprim (BACTRIM DS) 800-160 MG tablet Take 1 tablet daily on MWF 04/27/23   Glenford Bayley, NP      Allergies    Tape    Review of Systems   Review of Systems  HENT:  Positive for ear pain and hearing loss.   Respiratory:  Positive for shortness of breath.   Gastrointestinal:  Positive for abdominal distention, nausea and vomiting.    Physical Exam Updated Vital Signs BP (!) 149/101   Pulse 67   Temp 97.8 F (36.6 C)   Resp 19   Ht 5\' 1"  (1.549 m)   Wt 64.9 kg   SpO2 98%   BMI 27.02 kg/m  Physical Exam Vitals and nursing note reviewed.  Constitutional:      General: She is not in acute distress. HENT:     Head: Normocephalic and atraumatic.     Comments: Slight tenderness noted over right mastoid bone.    Left Ear: Tympanic membrane, ear canal and external ear normal. There is no impacted cerumen.     Ears:     Comments: Right ear: Severe swelling of the preauricular and postauricular lymph.  Exquisite tenderness noted over the tragus and preauricular and postauricular lymph nodes.    On otoscope exam, white discharge noted around the entire acoustic meatus.  Unable to see TM due to erythema and inflammation of the EAC. Eyes:     Extraocular Movements: Extraocular movements intact.     Conjunctiva/sclera: Conjunctivae normal.  Cardiovascular:     Rate and Rhythm: Normal rate and regular rhythm.     Pulses: Normal pulses.     Heart sounds: Normal heart sounds.  Pulmonary:     Effort: Pulmonary effort is normal. No respiratory distress.     Breath sounds: Normal breath sounds.  Abdominal:     General: Abdomen is flat.     Palpations: Abdomen is soft. There is no mass.     Tenderness: There is abdominal tenderness (Left lower quadrant abdominal pain noted on palpation).  Musculoskeletal:     Cervical back: Normal range of motion. No rigidity or tenderness.  Skin:    General: Skin is warm and dry.     Coloration:  Skin is not pale.     Findings: No erythema.  Neurological:     General: No focal deficit present.     Mental Status: She is alert. Mental status is at baseline.  Psychiatric:        Mood and Affect: Mood normal.     ED Results / Procedures / Treatments   Labs (all labs ordered are listed, but only abnormal results are displayed) Labs Reviewed  CBC WITH DIFFERENTIAL/PLATELET - Abnormal; Notable for the following components:      Result Value   WBC 23.5 (*)    RBC 3.81 (*)    MCV 103.4 (*)    Neutro Abs 22.1 (*)    Abs Immature Granulocytes 0.14 (*)    All other components within normal limits  COMPREHENSIVE METABOLIC PANEL - Abnormal; Notable for the following components:   Sodium 134 (*)    Glucose, Bld 129 (*)    BUN 36 (*)    Creatinine, Ser 1.13 (*)    Albumin 3.4 (*)    Anion gap 4 (*)    All other components within normal limits  CULTURE, BLOOD (ROUTINE X 2)  CULTURE, BLOOD (ROUTINE X 2)  URINALYSIS, ROUTINE W REFLEX MICROSCOPIC  PREGNANCY, URINE  LIPASE, BLOOD  LACTIC ACID, PLASMA  LACTIC ACID, PLASMA  TROPONIN I (HIGH SENSITIVITY)  TROPONIN I (HIGH SENSITIVITY)    EKG EKG Interpretation Date/Time:  Sunday May 22 2023 21:36:03 EST Ventricular Rate:  68 PR Interval:  110 QRS Duration:  84 QT Interval:  343 QTC Calculation: 365 R Axis:   21  Text Interpretation: Sinus rhythm Borderline short PR interval ST elev, probable normal early repol pattern when compared top rior, overall similar appearance. No STEMI Confirmed by Theda Belfast ([id number]) on 05/22/2023 9:42:30 PM  Radiology No results found.  Procedures Procedures    Medications Ordered in ED Medications  promethazine (PHENERGAN) 12.5 mg in sodium chloride 0.9 % 50 mL IVPB (12.5 mg Intravenous New Bag/Given 05/22/23 2314)  0.9 %  sodium chloride infusion (1,000 mLs Intravenous New Bag/Given 05/22/23 2312)  promethazine (PHENERGAN) 25 MG/ML injection (has no administration in time range)   ondansetron (ZOFRAN) injection 4 mg (4 mg Intravenous Given 05/22/23 2125)  morphine (PF) 4 MG/ML injection 4 mg (4 mg Intravenous Given 05/22/23 2257)    ED Course/ Medical Decision Making/ A&P            HEART Score: 4                    Medical Decision Making Amount and/or Complexity of Data Reviewed Labs: ordered. Radiology: ordered. ECG/medicine tests: ordered.  Risk Prescription drug management.   This patient is a 47 year old female who presents to the ED for concern of right  ear pain and swelling with nausea and vomiting.  And left lower quadrant normal.   Differential diagnoses prior to evaluation: The emergent differential diagnosis includes, but is not limited to, sarcoidosis flareup, mastoiditis, otitis externa, . This is not an exhaustive differential.   Past Medical History / Co-morbidities / Social History: Gastric cancer, Crohn's disease, sarcoidosis  Additional history: Chart reviewed. Pertinent results include: MRSA infection around her port which required removal.    Previous CT showed scarring, lung volume loss in the right middle lobe with minimal bronchocentric nodules/groundglass opacities in the left upper lobe due to chronic aspiration, infectious bronchiolitis, or chronic infection.  She has had her gastric cancer removed in 2014 at [place].  Lab Tests/Imaging studies: I personally interpreted labs/imaging and the pertinent results include:  WBC 23.5  Chest x-ray patient refused CT temporal pending CT abdomen pending   Cardiac monitoring: EKG obtained and interpreted by myself and attending physician which shows: Sinus rhythm Borderline short PR interval ST elev, probable normal early repol pattern   Medications: I ordered medication including Zofran, Phenergan for nausea and morphine for pain.  I have reviewed the patients home medicines and have made adjustments as needed.    ED Course:  Patient is a 47 year old female who  presents today with what appears to be severe otitis externa with possible mastoiditis will also complaining of left lower quadrant abdominal pain.  Complicated medical history.   due to her sarcoidosis,crohns, and being on prednisone will get CT of temporal bones to rule out mastoiditis and CT of abdomen to evaluate abdominal pain with possible Crohn's exacerbation.  Elevated white count due to infection possibly worsened with steroid use.  Blood cultures to be drawn.  Begin serial lactates.  Patient refused chest x-ray however not as important due to having previous CT done on 12/4.  Initially tried Zofran which did not work for nausea.  Provided Phenergan which worked.  Also provided morphine for pain.  Patient to be further evaluated by Dr. Eudelia Bunch.  Disposition: 11:39 PM Care of Shaneese Chopp transferred to Dr. Eudelia Bunch at the end of my shift as the patient will require reassessment once labs/imaging have resulted. Patient presentation, ED course, and plan of care discussed with review of all pertinent labs and imaging. Please see his/her note for further details regarding further ED course and disposition. Plan at time of handoff is after evaluation of CTs rule out mastoiditis, get blood cultures, possibly start antibiotics, evaluate abdominal pain with abdominal CT and possible admit to medicine. This may be altered or completely changed at the discretion of the oncoming team pending results of further workup.    Final Clinical Impression(s) / ED Diagnoses Final diagnoses:  Acute diffuse otitis externa of right ear    Rx / DC Orders ED Discharge Orders     None      Portions of this report may have been transcribed using voice recognition software. Every effort was made to ensure accuracy; however, inadvertent computerized transcription errors may be present.    Lunette Stands, [place] 05/22/23 2341    Tegeler, Canary Brim, MD 05/24/23 [phone]

## 2023-05-23 DIAGNOSIS — H9201 Otalgia, right ear: Secondary | ICD-10-CM | POA: Diagnosis present

## 2023-05-23 DIAGNOSIS — Z85028 Personal history of other malignant neoplasm of stomach: Secondary | ICD-10-CM | POA: Diagnosis not present

## 2023-05-23 DIAGNOSIS — I10 Essential (primary) hypertension: Secondary | ICD-10-CM | POA: Diagnosis not present

## 2023-05-23 DIAGNOSIS — R1032 Left lower quadrant pain: Secondary | ICD-10-CM | POA: Diagnosis not present

## 2023-05-23 DIAGNOSIS — D638 Anemia in other chronic diseases classified elsewhere: Secondary | ICD-10-CM | POA: Diagnosis not present

## 2023-05-23 DIAGNOSIS — K311 Adult hypertrophic pyloric stenosis: Secondary | ICD-10-CM | POA: Diagnosis not present

## 2023-05-23 DIAGNOSIS — N179 Acute kidney failure, unspecified: Secondary | ICD-10-CM | POA: Diagnosis not present

## 2023-05-23 DIAGNOSIS — K1120 Sialoadenitis, unspecified: Secondary | ICD-10-CM | POA: Diagnosis not present

## 2023-05-23 DIAGNOSIS — H6011 Cellulitis of right external ear: Secondary | ICD-10-CM

## 2023-05-23 DIAGNOSIS — H60311 Diffuse otitis externa, right ear: Secondary | ICD-10-CM | POA: Diagnosis not present

## 2023-05-23 DIAGNOSIS — Z79899 Other long term (current) drug therapy: Secondary | ICD-10-CM | POA: Diagnosis not present

## 2023-05-23 DIAGNOSIS — E875 Hyperkalemia: Secondary | ICD-10-CM | POA: Diagnosis not present

## 2023-05-23 DIAGNOSIS — H6010 Cellulitis of external ear, unspecified ear: Secondary | ICD-10-CM | POA: Diagnosis present

## 2023-05-23 DIAGNOSIS — N6320 Unspecified lump in the left breast, unspecified quadrant: Secondary | ICD-10-CM | POA: Diagnosis not present

## 2023-05-23 LAB — CBC WITH DIFFERENTIAL/PLATELET
Abs Immature Granulocytes: 0.07 10*3/uL (ref 0.00–0.07)
Basophils Absolute: 0 10*3/uL (ref 0.0–0.1)
Basophils Relative: 0 %
Eosinophils Absolute: 0.1 10*3/uL (ref 0.0–0.5)
Eosinophils Relative: 1 %
HCT: 34.4 % — ABNORMAL LOW (ref 36.0–46.0)
Hemoglobin: 10.8 g/dL — ABNORMAL LOW (ref 12.0–15.0)
Immature Granulocytes: 0 %
Lymphocytes Relative: 5 %
Lymphs Abs: 0.7 10*3/uL (ref 0.7–4.0)
MCH: 32.3 pg (ref 26.0–34.0)
MCHC: 31.4 g/dL (ref 30.0–36.0)
MCV: 103 fL — ABNORMAL HIGH (ref 80.0–100.0)
Monocytes Absolute: 0.7 10*3/uL (ref 0.1–1.0)
Monocytes Relative: 5 %
Neutro Abs: 14.5 10*3/uL — ABNORMAL HIGH (ref 1.7–7.7)
Neutrophils Relative %: 89 %
Platelets: 259 10*3/uL (ref 150–400)
RBC: 3.34 MIL/uL — ABNORMAL LOW (ref 3.87–5.11)
RDW: 14.7 % (ref 11.5–15.5)
WBC: 16.1 10*3/uL — ABNORMAL HIGH (ref 4.0–10.5)
nRBC: 0 % (ref 0.0–0.2)

## 2023-05-23 LAB — BASIC METABOLIC PANEL
Anion gap: 9 (ref 5–15)
BUN: 31 mg/dL — ABNORMAL HIGH (ref 6–20)
CO2: 23 mmol/L (ref 22–32)
Calcium: 9 mg/dL (ref 8.9–10.3)
Chloride: 103 mmol/L (ref 98–111)
Creatinine, Ser: 1.36 mg/dL — ABNORMAL HIGH (ref 0.44–1.00)
GFR, Estimated: 48 mL/min — ABNORMAL LOW (ref >60)
Glucose, Bld: 96 mg/dL (ref 70–99)
Potassium: 4.8 mmol/L (ref 3.5–5.1)
Sodium: 135 mmol/L (ref 135–145)

## 2023-05-23 LAB — SEDIMENTATION RATE: Sed Rate: 40 mm/h — ABNORMAL HIGH (ref 0–22)

## 2023-05-23 LAB — C-REACTIVE PROTEIN: CRP: 5.8 mg/dL — ABNORMAL HIGH (ref <1.0)

## 2023-05-23 MED ORDER — LEVOFLOXACIN 750 MG PO TABS
750.0000 mg | ORAL_TABLET | ORAL | Status: DC
  Administered 2023-05-23: 750 mg via ORAL
  Filled 2023-05-23: qty 1

## 2023-05-23 MED ORDER — RIVAROXABAN 10 MG PO TABS
10.0000 mg | ORAL_TABLET | Freq: Every day | ORAL | Status: DC
  Administered 2023-05-23 – 2023-05-24 (×2): 10 mg via ORAL
  Filled 2023-05-23 (×2): qty 1

## 2023-05-23 MED ORDER — CLINDAMYCIN PHOSPHATE 600 MG/50ML IV SOLN
600.0000 mg | Freq: Once | INTRAVENOUS | Status: AC
Start: 2023-05-23 — End: 2023-05-23
  Administered 2023-05-23: 600 mg via INTRAVENOUS
  Filled 2023-05-23: qty 50

## 2023-05-23 MED ORDER — IOHEXOL 300 MG/ML  SOLN
150.0000 mL | Freq: Once | INTRAMUSCULAR | Status: AC | PRN
  Administered 2023-05-23: 150 mL via INTRAVENOUS

## 2023-05-23 MED ORDER — PREDNISONE 20 MG PO TABS
30.0000 mg | ORAL_TABLET | Freq: Every day | ORAL | Status: DC
  Administered 2023-05-23 – 2023-05-24 (×2): 30 mg via ORAL
  Filled 2023-05-23 (×2): qty 1

## 2023-05-23 MED ORDER — ACETAMINOPHEN 500 MG PO TABS: 1000.0000 mg | ORAL_TABLET | Freq: Four times a day (QID) | ORAL | Status: DC | PRN

## 2023-05-23 MED ORDER — OFLOXACIN 0.3 % OP SOLN
10.0000 [drp] | Freq: Every day | OPHTHALMIC | Status: DC
  Administered 2023-05-23: 10 [drp] via OTIC
  Filled 2023-05-23: qty 5

## 2023-05-23 MED ORDER — ACETAMINOPHEN 325 MG PO TABS: 650.0000 mg | ORAL_TABLET | Freq: Four times a day (QID) | ORAL | Status: DC | PRN

## 2023-05-23 MED ORDER — CIPROFLOXACIN-HYDROCORTISONE 0.2-1 % OT SUSP
3.0000 [drp] | Freq: Two times a day (BID) | OTIC | Status: DC
  Administered 2023-05-23 – 2023-05-24 (×2): 3 [drp] via OTIC
  Filled 2023-05-23: qty 10

## 2023-05-23 MED ORDER — ALBUTEROL SULFATE (2.5 MG/3ML) 0.083% IN NEBU: 3.0000 mL | INHALATION_SOLUTION | Freq: Four times a day (QID) | RESPIRATORY_TRACT | Status: DC | PRN

## 2023-05-23 MED ORDER — LACTATED RINGERS IV SOLN: INTRAVENOUS | Status: AC

## 2023-05-23 MED ORDER — MOMETASONE FURO-FORMOTEROL FUM 200-5 MCG/ACT IN AERO
2.0000 | INHALATION_SPRAY | Freq: Two times a day (BID) | RESPIRATORY_TRACT | Status: DC
  Administered 2023-05-23 – 2023-05-24 (×3): 2 via RESPIRATORY_TRACT
  Filled 2023-05-23: qty 8.8

## 2023-05-23 MED ORDER — PREDNISONE 20 MG PO TABS
20.0000 mg | ORAL_TABLET | Freq: Every day | ORAL | Status: DC
Start: 2023-06-03 — End: 2023-05-24

## 2023-05-23 MED ORDER — LISINOPRIL 10 MG PO TABS
10.0000 mg | ORAL_TABLET | Freq: Every day | ORAL | Status: DC
  Administered 2023-05-23 – 2023-05-24 (×2): 10 mg via ORAL
  Filled 2023-05-23 (×2): qty 1

## 2023-05-23 MED ORDER — LEVOFLOXACIN 500 MG PO TABS: 500.0000 mg | ORAL_TABLET | Freq: Every day | ORAL | Status: DC

## 2023-05-23 MED ORDER — KETOROLAC TROMETHAMINE 15 MG/ML IJ SOLN
15.0000 mg | Freq: Once | INTRAMUSCULAR | Status: AC
  Administered 2023-05-23: 15 mg via INTRAVENOUS
  Filled 2023-05-23: qty 1

## 2023-05-23 MED ORDER — KETOROLAC TROMETHAMINE 15 MG/ML IJ SOLN
15.0000 mg | Freq: Four times a day (QID) | INTRAMUSCULAR | Status: AC | PRN
  Administered 2023-05-23 – 2023-05-24 (×5): 15 mg via INTRAVENOUS
  Filled 2023-05-23 (×5): qty 1

## 2023-05-23 MED ORDER — PANTOPRAZOLE SODIUM 40 MG PO TBEC
40.0000 mg | DELAYED_RELEASE_TABLET | Freq: Every day | ORAL | Status: DC
  Administered 2023-05-23 – 2023-05-24 (×2): 40 mg via ORAL
  Filled 2023-05-23 (×2): qty 1

## 2023-05-23 MED ORDER — ACETAMINOPHEN 500 MG PO TABS
1000.0000 mg | ORAL_TABLET | Freq: Four times a day (QID) | ORAL | Status: DC | PRN
  Administered 2023-05-24: 1000 mg via ORAL
  Filled 2023-05-23: qty 2

## 2023-05-23 MED ORDER — CIPROFLOXACIN IN D5W 400 MG/200ML IV SOLN
400.0000 mg | Freq: Once | INTRAVENOUS | Status: AC
  Administered 2023-05-23: 400 mg via INTRAVENOUS
  Filled 2023-05-23: qty 200

## 2023-05-23 MED ORDER — LEVOFLOXACIN 500 MG PO TABS
500.0000 mg | ORAL_TABLET | Freq: Every day | ORAL | Status: DC
  Filled 2023-05-23: qty 1

## 2023-05-23 MED ORDER — METOCLOPRAMIDE HCL 5 MG/ML IJ SOLN
10.0000 mg | Freq: Once | INTRAMUSCULAR | Status: AC
  Administered 2023-05-23: 10 mg via INTRAVENOUS
  Filled 2023-05-23: qty 2

## 2023-05-23 NOTE — ED Provider Notes (Signed)
I assumed care of this patient from previous provider.  Please see their note for further details of history, exam, and MDM.   Briefly patient is a 47 y.o. female who presented right otalgia with facial swelling.  Patient also having abdominal discomfort with nausea and vomiting.  Labs notable for leukocytosis of 23K.  Rest of the labs reassuring.  Patient is not in severe sepsis with a normal lactic acid.  Exam is concerning for otitis externa by previous provider.  Currently awaiting for CT scan to rule out mastoiditis.  Also waiting on CT of the abdomen given her history of gastric cancer.  CT without evidence of mastoiditis but it did reveal periauricular cellulitis and reactive parotitis.  On my exam patient does have mild purulence to the external ear canal.  Placed a ear wick and 10 drops of ofloxacin.  IV clinda and ciprofloxacin ordered for cellulitis versus malignant otitis externa.  ENT consult in the morning might be beneficial for additional recommendations.  CT of the abdomen notable for evidence of functional gastric outlet obstruction.  Patient is tolerating fluids.  Recommend bowel rest.  She does not require NG tube at this time.  Given Reglan to assist in motility.  Admitted to medicine for further work up and management      Kristina Graves, Kristina Garnet, MD 05/23/23 [phone]

## 2023-05-23 NOTE — ED Notes (Addendum)
ED TO INPATIENT HANDOFF REPORT  ED Nurse Name and Phone #: Hayden Pedro [phone]  S Name/Age/Gender Kristina Graves 47 y.o. female Room/Bed: MH05/MH05  Code Status   Code Status: Not on file  Home/SNF/Other Home Patient oriented to: self, place, time, and situation Is this baseline? Yes   Triage Complete: Triage complete  Chief Complaint Cellulitis of right ear [H60.11]  Triage Note Pt c/o RT ear pain and swelling to ear and surrounding area; also reports she vomits "all the time" x 3 wks; sts she wakes up and her bed is filled with vomit sometimes    Allergies Allergies  Allergen Reactions   Tape Other (See Comments)    Level of Care/Admitting Diagnosis ED Disposition     ED Disposition  Admit   Condition  --   Comment  Hospital Area: [facility] [[id number]]  Level of Care: Telemetry Medical [104]  May admit patient to [facility] or [facility] if equivalent level of care is available:: No  Interfacility transfer: Yes  Covid Evaluation: Asymptomatic - no recent exposure (last 10 days) testing not required  Diagnosis: Cellulitis of right ear [4403474]  Admitting Physician: Tereasa Coop [[id number]]  Attending Physician: Tereasa Coop [[id number]]  Certification:: I certify this patient will need inpatient services for at least 2 midnights  Expected Medical Readiness: 05/27/2023          B Medical/Surgery History Past Medical History:  Diagnosis Date   Cancer (HCC)    stomach    Crohn disease (HCC)    Essential hypertension 03/18/2022   PVNS (pigmented villonodular synovitis)    Sarcoidosis    Past Surgical History:  Procedure Laterality Date   colon reconstruction     GASTRECTOMY  2014   OOPHORECTOMY       A IV Location/Drains/Wounds Patient Lines/Drains/Airways Status     Active Line/Drains/Airways     Name Placement date Placement time Site Days   Implanted Port 03/21/21 Right Chest 03/21/21  2316  Chest  793    Peripheral IV 05/22/23 20 G 1.88" Right Antecubital 05/22/23  2124  Antecubital  1   Peripheral IV 05/22/23 20 G 1.88" Anterior;Left;Upper Arm 05/22/23  2250  Arm  1            Intake/Output Last 24 hours  Intake/Output Summary (Last 24 hours) at 05/23/2023 0153 Last data filed at 05/23/2023 0151 Gross per 24 hour  Intake 100.49 ml  Output --  Net 100.49 ml    Labs/Imaging Results for orders placed or performed during the hospital encounter of 05/22/23 (from the past 48 hour(s))  Pregnancy, urine     Status: None   Collection Time: 05/22/23  9:16 PM  Result Value Ref Range   Preg Test, Ur NEGATIVE NEGATIVE    Comment:        THE SENSITIVITY OF THIS METHODOLOGY IS >25 mIU/mL. Performed at [facility], [address] [place], [place] [address]   Troponin I (High Sensitivity)     Status: None   Collection Time: 05/22/23  9:21 PM  Result Value Ref Range   Troponin I (High Sensitivity) 5 <18 ng/L    Comment: (NOTE) Elevated high sensitivity troponin I (hsTnI) values and significant  changes across serial measurements may suggest ACS but many other  chronic and acute conditions are known to elevate hsTnI results.  Refer to the "Links" section for chest pain algorithms and additional  guidance. Performed at [facility], [address] [place], [place] [address]   CBC with Differential     Status: Abnormal   Collection Time: 05/22/23  9:23 PM  Result Value Ref Range   WBC 23.5 (H) 4.0 - 10.5 K/uL   RBC 3.81 (L) 3.87 - 5.11 MIL/uL   Hemoglobin 12.2 12.0 - 15.0 g/dL   HCT 60.4 54.0 - 98.1 %   MCV 103.4 (H) 80.0 - 100.0 fL   MCH 32.0 26.0 - 34.0 pg   MCHC 31.0 30.0 - 36.0 g/dL   RDW 19.1 47.8 - 29.5 %   Platelets 316 150 - 400 K/uL   nRBC 0.0 0.0 - 0.2 %   Neutrophils Relative % 94 %   Neutro Abs 22.1 (H) 1.7 - 7.7 K/uL   Lymphocytes Relative 3 %   Lymphs Abs 0.8 0.7 - 4.0 K/uL   Monocytes Relative 2 %   Monocytes Absolute 0.6 0.1 - 1.0 K/uL    Eosinophils Relative 0 %   Eosinophils Absolute 0.0 0.0 - 0.5 K/uL   Basophils Relative 0 %   Basophils Absolute 0.0 0.0 - 0.1 K/uL   Immature Granulocytes 1 %   Abs Immature Granulocytes 0.14 (H) 0.00 - 0.07 K/uL    Comment: Performed at [facility] [address] [place], [place] [address]  Urinalysis, Routine w reflex microscopic -Urine, Clean Catch     Status: None   Collection Time: 05/22/23  9:23 PM  Result Value Ref Range   Color, Urine YELLOW YELLOW   APPearance CLEAR CLEAR   Specific Gravity, Urine 1.020 1.005 - 1.030   pH 6.0 5.0 - 8.0   Glucose, UA NEGATIVE NEGATIVE mg/dL   Hgb urine dipstick NEGATIVE NEGATIVE   Bilirubin Urine NEGATIVE NEGATIVE   Ketones, ur NEGATIVE NEGATIVE mg/dL   Protein, ur NEGATIVE NEGATIVE mg/dL   Nitrite NEGATIVE NEGATIVE   Leukocytes,Ua NEGATIVE NEGATIVE    Comment: Microscopic not done on urines with negative protein, blood, leukocytes, nitrite, or glucose < 500 mg/dL. Performed at [facility], [address] [place], [place] [address]   Comprehensive metabolic panel     Status: Abnormal   Collection Time: 05/22/23 10:00 PM  Result Value Ref Range   Sodium 134 (L) 135 - 145 mmol/L   Potassium 5.0 3.5 - 5.1 mmol/L   Chloride 108 98 - 111 mmol/L   CO2 22 22 - 32 mmol/L   Glucose, Bld 129 (H) 70 - 99 mg/dL    Comment: Glucose reference range applies only to samples taken after fasting for at least 8 hours.   BUN 36 (H) 6 - 20 mg/dL   Creatinine, Ser 4.69 (H) 0.44 - 1.00 mg/dL   Calcium 9.5 8.9 - 62.9 mg/dL   Total Protein 6.8 6.5 - 8.1 g/dL   Albumin 3.4 (L) 3.5 - 5.0 g/dL   AST 18 15 - 41 U/L   ALT 13 0 - 44 U/L   Alkaline Phosphatase 48 38 - 126 U/L   Total Bilirubin 0.7 <1.2 mg/dL   GFR, Estimated >52 >84 mL/min    Comment: (NOTE) Calculated using the CKD-EPI Creatinine Equation (2021)    Anion gap 4 (L) 5 - 15    Comment: Performed at [facility], [address] [place], [place] [address]   Lipase, blood     Status: None   Collection Time: 05/22/23 10:00 PM  Result Value Ref Range   Lipase 21 11 - 51 U/L  Comment: Performed at [facility], [address] [place], [place] [address]  Lactic acid, plasma     Status: None   Collection Time: 05/22/23 10:06 PM  Result Value Ref Range   Lactic Acid, Venous 0.9 0.5 - 1.9 mmol/L    Comment: Performed at [facility], [address] [place], [place] [address]   CT Temporal Bones W Contrast  Result Date: 05/23/2023 CLINICAL DATA:  Hearing loss, mixed mastoiditis EXAM: CT TEMPORAL BONES WITH CONTRAST TECHNIQUE: Axial and coronal plane CT imaging of the petrous temporal bones was performed with thin-collimation image reconstruction following intravenous contrast administration. Multiplanar CT image reconstructions were also generated. RADIATION DOSE REDUCTION: This exam was performed according to the departmental dose-optimization program which includes automated exposure control, adjustment of the mA and/or kV according to patient size and/or use of iterative reconstruction technique. CONTRAST:  OMNIPAQUE IOHEXOL 300 MG/ML  SOLN COMPARISON:  No prior CT of the temporal bones available, correlation is made with CT head 07/05/2022 FINDINGS: RIGHT TEMPORAL BONE External auditory canal: Extensive hyperenhancement with multiple low-density collections in the superficial soft tissues of the right ear (series 302, image 14 and series 602, image 50). The osseous portion of the external auditory canal is unremarkable. Middle ear cavity: Normally aerated. The scutum and ossicles are normal. The tegmen tympani is intact. Inner ear structures: The cochlea, vestibule and semicircular canals are normal. The vestibular aqueduct is not enlarged. Internal auditory and facial nerve canals:  Normal Mastoid air cells: Normally aerated. No osseous erosion. LEFT TEMPORAL BONE External auditory canal: Normal. Middle ear cavity: Normally  aerated. The scutum and ossicles are normal. The tegmen tympani is intact. Inner ear structures: The cochlea, vestibule and semicircular canals are normal. The vestibular aqueduct is not enlarged. Internal auditory and facial nerve canals:  Normal. Mastoid air cells: Normally aerated. No osseous erosion. Vascular: Normal post-contrast appearance of the vasculature. Limited intracranial:  No acute or significant finding. Visible orbits/paranasal sinuses: Mucosal thickening in the ethmoid air cells. Remote right lamina papyracea fracture. No acute finding in the orbits. Soft tissues: In addition to the extensive hyperenhancement and skin thickening with multiple low-density collections in the right periauricular soft tissues, there is hyperenhancement in the imaged portion of the right parotid gland (series 302, image 2 and series 602, image 49). IMPRESSION: 1. Extensive hyperenhancement and skin thickening with multiple low-density collections in the superficial soft tissues of the right ear, consistent with cellulitis. No evidence of mastoiditis. 2. Hyperenhancement in the imaged portion of the right parotid gland, which may be reactive or represent parotitis. Electronically Signed   By: Wiliam Ke M.D.   On: 05/23/2023 00:34   CT ABDOMEN PELVIS W CONTRAST  Result Date: 05/23/2023 CLINICAL DATA:  Left lower quadrant abdominal pain EXAM: CT ABDOMEN AND PELVIS WITH CONTRAST TECHNIQUE: Multidetector CT imaging of the abdomen and pelvis was performed using the standard protocol following bolus administration of intravenous contrast. RADIATION DOSE REDUCTION: This exam was performed according to the departmental dose-optimization program which includes automated exposure control, adjustment of the mA and/or kV according to patient size and/or use of iterative reconstruction technique. CONTRAST:  OMNIPAQUE IOHEXOL 300 MG/ML  SOLN COMPARISON:  02/07/2022 FINDINGS: Lower chest: Bases are clear. Hepatobiliary:  Liver is within normal limits. Gallbladder is decompressed. No intrahepatic or extrahepatic ductal dilatation. Pancreas: Parenchymal atrophy. Spleen: Within normal limits. Adrenals/Urinary Tract: Adrenal glands are within normal limits. Right kidney is within normal limits. 2.1 cm  simple left upper pole renal cyst (series 301/image 35), benign (Bosniak I). No follow-up is recommended. No hydronephrosis. Bladder is within normal limits. Stomach/Bowel: Status post distal gastrectomy with gastrojejunostomy. Moderately distended stomach with debris. Eccentric wall thickening with narrowing just distal to the anastomosis of the gastrojejunostomy (series 301/images 30-31). This appearance raises the possibility of functional obstruction at the gastrojejunostomy. No evidence of small bowel obstruction. Appendix is not discretely visualized. No colonic wall thickening or inflammatory changes. Vascular/Lymphatic: No evidence of abdominal aortic aneurysm. No suspicious abdominopelvic lymphadenopathy. Reproductive: Uterus within normal limits. Bilateral ovaries are normal limits. Other: No abdominopelvic ascites. Musculoskeletal: Mild degenerative changes at L5-S1. Benign bone island at T11. IMPRESSION: Status post distal gastrectomy with gastrojejunostomy. Eccentric wall thickening with narrowing of the gastrojejunostomy just distal to the anastomosis. Suspected functional obstruction with moderate debris in the stomach. No evidence of small bowel obstruction. Electronically Signed   By: Charline Bills M.D.   On: 05/23/2023 00:30    Pending Labs Unresulted Labs (From admission, onward)     Start     Ordered   05/22/23 2143  Blood culture (routine x 2)  BLOOD CULTURE X 2,   STAT      05/22/23 2143            Vitals/Pain Today's Vitals   05/22/23 2136 05/22/23 2320 05/23/23 0026 05/23/23 0035  BP: (!) 149/101   (!) 173/110  Pulse: 67   84  Resp: 19   17  Temp:    (!) 97.4 F (36.3 C)  TempSrc:    Oral   SpO2: 98%   100%  Weight:      Height:      PainSc:  5  8      Isolation Precautions No active isolations  Medications Medications  promethazine (PHENERGAN) 12.5 mg in sodium chloride 0.9 % 50 mL IVPB (0 mg Intravenous Stopped 05/22/23 2346)  0.9 %  sodium chloride infusion (1,000 mLs Intravenous New Bag/Given 05/23/23 0113)  promethazine (PHENERGAN) 25 MG/ML injection (has no administration in time range)  ofloxacin (OCUFLOX) 0.3 % ophthalmic solution 10 drop (10 drops Right EAR Given 05/23/23 0150)  ondansetron (ZOFRAN) injection 4 mg (4 mg Intravenous Given 05/22/23 2125)  morphine (PF) 4 MG/ML injection 4 mg (4 mg Intravenous Given 05/22/23 2257)  iohexol (OMNIPAQUE) 300 MG/ML solution 150 mL (150 mLs Intravenous Contrast Given 05/23/23 0013)  clindamycin (CLEOCIN) IVPB 600 mg (0 mg Intravenous Stopped 05/23/23 0151)  ketorolac (TORADOL) 15 MG/ML injection 15 mg (15 mg Intravenous Given 05/23/23 0105)  metoCLOPramide (REGLAN) injection 10 mg (10 mg Intravenous Given 05/23/23 0104)    Mobility walks     Focused Assessments    Elevated WBC with hx- Sarcoidosis and Per CT scan - Parotitis.    R Recommendations: See Admitting Provider Note  Report given to:   Additional Notes:  Patient has be started on Olfoxacin ear drops

## 2023-05-23 NOTE — Progress Notes (Addendum)
HD#1 SUBJECTIVE:  Patient Summary: Kristina Graves is a 47 y.o. 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    Interim History: Patient is evaluated at bedside this morning. Endorse R ear pain and swelling, unchanged since last night. Endorses localized facial swelling and erythema with difficulty hearing from the right ear. No nausea or vomiting since last night. Last BM was yesterday. Patient reports changes in her stool and bowel movement for the past couple of months, reports she has multiple of episodes of BM through out the day with small stools. Patient reports she has had multiple of hospitalizations this year, she is a travel LPN. She reports she was diagnosed with sarcoid in her eyes when she 44, diagnosed with crohn's at age of 19. Not on any active treatment for crohn's, states that she does not believe she has crohn's.   OBJECTIVE:  Vital Signs: Vitals:   05/23/23 0310 05/23/23 0644 05/23/23 0829 05/23/23 0914  BP: (!) 154/100 (!) 143/102 128/88   Pulse: (!) 106 74 66   Resp: 18 18 18    Temp: (!) 97.5 F (36.4 C) (!) 97.5 F (36.4 C) 97.6 F (36.4 C)   TempSrc: Oral Oral Oral   SpO2: (!) 69%  100% 100%  Weight:      Height:       Supplemental O2: Room Air SpO2: 100 %  Filed Weights   05/22/23 2022  Weight: 64.9 kg     Intake/Output Summary (Last 24 hours) at 05/23/2023 1158 Last data filed at 05/23/2023 0500 Gross per 24 hour  Intake 321.03 ml  Output --  Net 321.03 ml   Net IO Since Admission: 321.03 mL [05/23/23 1158]  Physical Exam: Physical Exam  General: no acute distress HENT: Atraumatic, normocephalic, no periorbital swelling. Ears: + R ear periauricular erythema, swelling, and  tenderness, +postauricular swelling and erythema. No cervical lymph nodes appreciated, External ear canal unable to visualize due to swelling.  Cardiovascular: Regular rate, regular rhythm, no murmurs rubs or gallops Pulmonary: Normal work of breathing, no wheezing crackles. Abdomen: Mild tenderness to palpation, non distended, soft, otherwise bowel sounds present  Patient Lines/Drains/Airways Status     Active Line/Drains/Airways     Name Placement date Placement time Site Days   Implanted Port 03/21/21 Right Chest 03/21/21  2316  Chest  793   Peripheral IV 05/22/23 20 G 1.88" Right Antecubital 05/22/23  2124  Antecubital  1   Peripheral IV 05/22/23 20 G 1.88" Anterior;Left;Upper Arm 05/22/23  2250  Arm  1             ASSESSMENT/PLAN:  Assessment: Principal Problem:   Cellulitis of right ear Active Problems:   Cellulitis of ear   Plan: #Right Ear Cellulitis,  #Right Parotitis  Presented with progressive R ear pain/swelling/hearing difficulty started after Thanksgiving, progressing to right facial swelling. Afebrile, initially presented with WBC 23.5 with neutrophilia (on steroids since 11/19 for sarcoid), Lactic acid wnl. At [facility] ED, providers appreciated mild purulence of the external ear canal and she was given an ear wick with 10 drops of Ofloxacin, 1 dose of IV Clindamycin.  CT showed extensive hyperenhancement and skin thickening with multiple low-density collections in the superficial soft tissue of the right ear consistent  with cellulitis and hyperenhancement of the right parotid gland she can be reactive. No evidence of mastoiditis. Middle ear cavity normal, inner ear structures normal.  Internal auditory and facial nerve canals normal. Initially started on PO Levofloxacin 500 mg to cover for Pseudomonas/staph/strep systemically, however will transition to 750 every 48 hours for 4 days for renal adjustment.   Plan: -Continue ciprofloxacin-cortisone 3 drops in the  right ear 2 times daily -Continue PO levofloxacin 750 mg, p.o., every 48 hours for 4 doses, last day 12/15 -Tylenol 1000 mg every 6 hours as needed for pain -IV Toradol 15 mg every 6 hours as needed for moderate pain  #Functional bowel obstruction #Hx of pulmonary sarcoidosis #Remote hx of Crohn's disease Presented with nausea and vomiting for 3 weeks, with changes in bowel movement. Reports she has been having multiple of small bowel movements through out the day that is different for her, for the past 2 months. Denies any nausea or vomiting since last night. Continues to endorse abdominal pain. 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 States she has an appointment scheduled with [facility] GI tomorrow to establish care with gastroenterology for her ongoing n/v and abnormal BMs. CRP and ESR elevated 5.8, 40 respectively, though decreased compared to 11/12, pt has an appointment with rheumatology on 09/2023. She is currently afebrile, with WBC trending downwards. Will slowly advance diet today and see how she tolerates. Highly recommend OP f/u with GI with pt's hx of sarcoid and crohns.   Plan: -Protonix 40 mg daily for burning sensation in her esophagus -Phenergan 12.5 mg IV as needed for nausea and vomiting -Advance diet as tolerated starting with clear liquids -Start home prednisone taper with 30 mg for remaining 11 days followed by 20 mg for 28 days -Continue Dulera and Proventil. -Next f/u appointment w/ pulm 12/16  AKI Cr elevated 1.36 [1.13] GFR 48, likely pre-renal in the setting of external GI losses. Currently receiving maintenance fluids.  - continue to trend Cr   Chronic conditions: HTN: Continue home medication lisinopril 10 mg daily. Anemia of chronic disease: Hemoglobin  10.2 [12.2], will continue to monitor CBC Breast Mass: per mammo, two left sided breast lumps, patient currently rejected biopsy, would like to take care of it later.    Best Practice: Diet: Advance diet as patient tolerates, starting with clear liquids IVF: Fluids: LR, Rate:  75 mL for 12 hours VTE: rivaroxaban (XARELTO) tablet 10 mg Start: 05/23/23 1000 Code: Full Therapy Recs: None, DME: none DISPO: Anticipated discharge tomorrow to Home pending  medical management .  Signature: Jeral Pinch, D.O.  Internal Medicine Resident, PGY-1 [facility]  Pager: [phone] 11:58 AM, 05/23/2023   Please contact the on call pager after 5 pm and on weekends at [phone].

## 2023-05-23 NOTE — Hospital Course (Addendum)
Plan: #Right Ear Cellulitis,  #Right Parotitis  Presented with progressive R ear pain/swelling/hearing difficulty started after Thanksgiving, progressing to right facial swelling. Afebrile, initially presented with WBC 23.5 with neutrophilia (on steroids since 11/19 for sarcoid), Lactic acid wnl. At [facility] ED, providers appreciated mild purulence of the external ear canal and she was given an ear wick with 10 drops of Ofloxacin, 1 dose of IV Clindamycin.  CT showed extensive hyperenhancement and skin thickening with multiple low-density collections in the superficial soft tissue of the right ear consistent with cellulitis and hyperenhancement of the right parotid gland she can be reactive. No evidence of mastoiditis. Middle ear cavity normal, inner ear structures normal.  Internal auditory and facial nerve canals normal. Initially started on PO Levofloxacin 500 mg to cover for Pseudomonas/staph/strep systemically, then transition to 750 every 48 hours, she received one dose, as her Cr function improved, she is transitioned back to Levo 750 mg every 24 hours to complete 7 day course.  She has also been receiving a Tylenol 1000 mg every 6 hours and IV Toradol 15 mg every 6 hours as needed for pain control.  Otherwise per evaluation today, swelling and redness of her right ear had improved.  She is stable to go home with the Cipro HC attic drops and p.o. Levaquin to complete a 6-day course.  She is also sent home with couple of days of Toradol for pain management.  Patient is advised to follow back with the primary care doctor whom she has appointment scheduled on Saturday.      #Functional bowel obstruction #Hx of pulmonary sarcoidosis #Remote hx of Crohn's disease Presented with nausea and vomiting for 3 weeks, with changes in bowel movement. Reports she has been having multiple of small bowel movements through out the day that is different for her, for the past 2 months. Denies any nausea or vomiting  since last night. Continues to endorse abdominal pain. 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 States she has an appointment scheduled with [facility] GI tomorrow to establish care with gastroenterology for her ongoing n/v and abnormal BMs. CRP and ESR elevated 5.8, 40 respectively, though decreased compared to 11/12, pt has an appointment with rheumatology on 09/2023. She is currently afebrile, with WBC trending downwards.  During this admission, her diet was slowly advanced, she has been tolerating her diet well.  She was given Protonix 40 mg daily for acid reflux and Phenergan 12.5 mg IV as needed for nausea and vomiting.  She tolerated those well.  She was also started on her home medication prednisone taper 30 mg.  We also continue her Dulera and Proventil.  She did reports she has next follow-up ointment with pulmonologist on December 16.  Otherwise per evaluation today, she denied any nausea or vomiting.  Reported improved abdominal pain.  She reports that she tolerated her diet well.  States that she has a follow-up ointment on December 14 with the [place] clinic. Highly recommend OP f/u with GI with pt's hx of sarcoid and crohns.    AKI Cr elevated 1.36 [1.13] GFR 48, likely pre-renal in the setting of external GI losses. Currently receiving maintenance fluids.  Resolved.  Hyperkalemia Potassium was noted to be 5.3, repeat 5.3, gave her lokelma.  Otherwise patient is asymptomatic, no chest pain, no shortness of breath, no palpitations.  She is  advised to follow-up with repeat potassium when she sees her primary care physician on Saturday.   Chronic conditions: HTN: Continue home medication lisinopril 10 mg daily. Anemia of chronic disease: Hemoglobin stable.  Breast Mass: per mammo, two  left sided breast lumps, patient currently rejected biopsy, would like to take care of it later.   -------------------------- Ms. Rumple, She came to the hospital for concerns about right ear pain and swelling with 3 weeks of ongoing nausea and vomiting.  We gave you fluids, antibiotics, pain medications.  For your right ear pain/swelling -Cipro HC otic, Place 3 drops into the right ear 2 times daily for 6 days starting tomorrow -Levofloxacin 750 mg take 1 tablet by mouth for 6 days starting tomorrow -Toradol 10 mg, take 1 tablet by mouth every 6 hours as needed for moderate to severe pain, for 3 days  For your nausea and vomiting -Phenergan 12.5 mg, take 1 tablet by mouth every 6 hours as needed for nausea and vomiting, we are sending you 3 days worth. -Fully advance your diet from soft to solid  For your pulmonary sarcoidosis Please take your prednisone that you have at home as following: - Prednisone 30 mg, take 1-1/2 tablet by mouth daily for 9 days starting tomorrow, then take 20 mg, 1 tablet by mouth daily for 28 days. - Once you finish your antibiotic, Levaquin on December 15, take Bactrim that you have at home only on December 16 Monday and December 18 on Wednesday.  You do not have to take the Bactrim once you are taking the 20 mg of the prednisone.  For your blood pressure, please continue taking your lisinopril 10 mg daily For your breast mass, please follow-up for biopsy.  Please follow-up with your pulmonologist on December 16.  Please follow-up with your primary care physician at [facility] on December 14.  If you have any of these following symptoms, please call us or seek care at an emergency department: -Chest Pain -Difficulty Breathing -Worsening abdominal pain -Syncope (passing out) -Drooping of face -Slurred speech -Sudden weakness in your leg or arm -Fever -Chills -blood in the stool -dark black, sticky stool  If you have any questions or concerns,  call our clinic at [phone] or after hours call [phone] and ask for the internal medicine resident on call.  I am glad you are feeling better. It was a pleasure taking care for you. I wish a good recovery and good health!   Dr. Jeral Pinch   -----------------------------------------------------------------------  Disposition

## 2023-05-23 NOTE — ED Notes (Signed)
Report called to [employer] RN from 6N.

## 2023-05-23 NOTE — Plan of Care (Signed)
  Problem: Education: Goal: Knowledge of General Education information will improve Description: Including pain rating scale, medication(s)/side effects and non-pharmacologic comfort measures 05/23/2023 0441 by Dudley Major, RN Outcome: Progressing 05/23/2023 0441 by Dudley Major, RN Outcome: Progressing   Problem: Health Behavior/Discharge Planning: Goal: Ability to manage health-related needs will improve 05/23/2023 0441 by Dudley Major, RN Outcome: Progressing 05/23/2023 0441 by Dudley Major, RN Outcome: Progressing   Problem: Clinical Measurements: Goal: Ability to maintain clinical measurements within normal limits will improve 05/23/2023 0441 by Dudley Major, RN Outcome: Progressing 05/23/2023 0441 by Dudley Major, RN Outcome: Progressing Goal: Will remain free from infection 05/23/2023 0441 by Dudley Major, RN Outcome: Progressing 05/23/2023 0441 by Dudley Major, RN Outcome: Progressing Goal: Diagnostic test results will improve 05/23/2023 0441 by Dudley Major, RN Outcome: Progressing 05/23/2023 0441 by Dudley Major, RN Outcome: Progressing Goal: Respiratory complications will improve 05/23/2023 0441 by Dudley Major, RN Outcome: Progressing 05/23/2023 0441 by Dudley Major, RN Outcome: Progressing Goal: Cardiovascular complication will be avoided 05/23/2023 0441 by Dudley Major, RN Outcome: Progressing 05/23/2023 0441 by Dudley Major, RN Outcome: Progressing   Problem: Activity: Goal: Risk for activity intolerance will decrease 05/23/2023 0441 by Dudley Major, RN Outcome: Progressing 05/23/2023 0441 by Dudley Major, RN Outcome: Progressing   Problem: Nutrition: Goal: Adequate nutrition will be maintained 05/23/2023 0441 by Dudley Major, RN Outcome: Progressing 05/23/2023 0441 by Dudley Major, RN Outcome: Progressing   Problem: Coping: Goal: Level of anxiety will decrease 05/23/2023 0441 by Dudley Major, RN Outcome:  Progressing 05/23/2023 0441 by Dudley Major, RN Outcome: Progressing   Problem: Elimination: Goal: Will not experience complications related to bowel motility 05/23/2023 0441 by Dudley Major, RN Outcome: Progressing 05/23/2023 0441 by Dudley Major, RN Outcome: Progressing Goal: Will not experience complications related to urinary retention 05/23/2023 0441 by Dudley Major, RN Outcome: Progressing 05/23/2023 0441 by Dudley Major, RN Outcome: Progressing   Problem: Pain Management: Goal: General experience of comfort will improve 05/23/2023 0441 by Dudley Major, RN Outcome: Progressing 05/23/2023 0441 by Dudley Major, RN Outcome: Progressing   Problem: Safety: Goal: Ability to remain free from injury will improve 05/23/2023 0441 by Dudley Major, RN Outcome: Progressing 05/23/2023 0441 by Dudley Major, RN Outcome: Progressing   Problem: Skin Integrity: Goal: Risk for impaired skin integrity will decrease 05/23/2023 0441 by Dudley Major, RN Outcome: Progressing 05/23/2023 0441 by Dudley Major, RN Outcome: Progressing

## 2023-05-23 NOTE — Plan of Care (Addendum)
Plan of Care Note for accepted transfer  Patient: Kristina Graves              AVW:[id number]  DOA: 05/22/2023     Facility requesting transfer: [facility] Requesting Provider: DR. Eudelia Bunch  Reason for transfer: Right-sided Ear cellulitis and functional bowel obstruction.  Facility course: 47 year old female history of sarcoidosis stage IV, chronic anemia, chronic depression, Crohn's disease, essential hypertension, gastric cancer s/p resection and chemotherapy 2014 Presented to emergency department complaining of 3 weeks of ear pain,  nausea and vomiting. Was previously on prednisone and taken off but just started again on 11/19.  States she wakes up every night in her own vomit or vomiting for the last 3 weeks, endorses left lower quadrant abdominal pain.  States she had normal movements morning. Says she has right-sided hearing loss that started today after she used "sweet oil" and a Q-tip in her ear last night.  Right ear swelling that began this morning. Denies fever, chest pain, diarrhea, constipation, dysuria, hematuria.   I presentation to ED patient found to have elevated blood pressure 172/118, tachycardic 79, respiratory 20 and O2 sat 100% room air.  Urine pregnancy Krystin troponin negative  CBC showing leukocytosis 23.5 stable H&H and platelet count WNL. CMP showing mild hyponatremia 134, elevated creatinine 1.19 otherwise unremarkable. Normal  lipase level 21. Lactic acid 0.9 Blood cultures x 2 in process.  CT temporal bone showing: IMPRESSION: 1. Extensive hyperenhancement and skin thickening with multiple low-density collections in the superficial soft tissues of the right ear, consistent with cellulitis. No evidence of mastoiditis. 2. Hyperenhancement in the imaged portion of the right parotid gland, which may be reactive or represent parotitis.  CT abdomen pelvis:  Status post distal gastrectomy with gastrojejunostomy.  Eccentric wall thickening with  narrowing of the gastrojejunostomy just distal to the anastomosis. Suspected functional obstruction with moderate debris in the stomach.  No evidence of small bowel obstruction.   Spoke with Dr. Eudelia Bunch will check  patient's Ear cannula and will evaluate in case patient need an Ear wick versus continue IV  clindamycin for management of right-sided cellulitis.  A CT abdomen pelvis showed functional bowel obstruction without any abscess or bowel obstruction patient will benefit from bowel rest and IV fluid.  In the ED patient has been treated with Phenergan, Reglan, morphine, Zofran and clindamycin 600 mg one-time dose.  Hospitalist has been contacted for further evaluation management of Ear cellulitis and functional bowel obstruction.  Update, Dr. Eudelia Bunch reported that auditory canal looking little purulence and ear wick with ciprofloxacin drops has been placed. Please consult and inform ENT about this patient upon arrival to [facility].  Plan of care: The patient is accepted for admission for inpatien status to Telemetry unit, at [facility].  Check [email or web address] for on-call coverage.  [facility] will assume care on arrival to accepting facility. Until arrival, medical decision making responsibilities remain with the EDP.  However, [facility] available 24/7 for questions and assistance.   Nursing staff please page [facility] Admits and Consults [phone]) as soon as the patient arrives to the hospital.    Author: Tereasa Coop, MD  05/23/2023  [facility]

## 2023-05-23 NOTE — H&P (Addendum)
Date: 05/23/2023               Patient Name:  Kristina Graves MRN: [id number]  DOB: 01-27-1976 Age / Sex: 47 y.o., female   PCP: Annett Fabian, MD         Medical Service: Internal Medicine Teaching Service         Attending Physician: Dr. Gust Rung, DO      First Contact: Dr. Jeral Pinch, DO Pager [phone]    Second Contact: Dr. Rana Snare, DO Pager [phone]         After Hours (After 5p/  First Contact Pager: [phone]  weekends / holidays): Second Contact Pager: [phone]   SUBJECTIVE   Chief Complaint: Ear/Facial Swelling/Pain and N/V  History of Present Illness: India Forthman is a 47 year old female with PMH of pulmonary sarcoidosis, anemia of chronic disease, HTN, gastric cancer s/p resection and chemotherapy in 2014 who presented to [facility] on 12/8 with right-sided ear pain/swelling and n/v. Patient states she woke up 11/30 and noticed swelling in front of and behind ear. Pain at this time was 4/10. As days progressed both swelling and pain increased. Currently 8/10. Swelling started to involve right side cheek. Endorses a popping sound with intermittent hearing loss. This has also started to cause discomfort with mastication. No prior history with any of this. She has not appreciated any ear drainage but states she used a Q-tip and small "scabs" came out.     Patient also endorsing 3 weeks of n/v and left lower quadrant abdominal pain. N/V mostly occur at night and has been worse in recent days. Tried Zofran to no relief but states phenergan helps. She is able to eat/drink. Endorses subjective low-grade fevers and SOB related to her recent sarcoidosis flare, but denies HA, visual changes, chest pain, chills, dysuria, constipation, diarrhea, or dark stools.   Medications:  Current Outpatient Medications  Medication Instructions   albuterol (VENTOLIN HFA) 108 (90 Base) MCG/ACT inhaler 1-2 puffs, Inhalation, Every 6 hours PRN   budesonide-formoterol  (SYMBICORT) 160-4.5 MCG/ACT inhaler 2 puffs, Inhalation, 2 times daily   fluticasone furoate-vilanterol (BREO ELLIPTA) 200-25 MCG/ACT AEPB 1 puff, Inhalation, Daily   lisinopril (ZESTRIL) 10 MG tablet 1 tablet, Oral, Daily   predniSONE (DELTASONE) 20 MG tablet Take 1.5 tablets (30 mg total) by mouth daily with breakfast for 28 days, THEN 1 tablet (20 mg total) daily with breakfast for 28 days.   sulfamethoxazole-trimethoprim (BACTRIM DS) 800-160 MG tablet Take 1 tablet daily on MWF    Past Medical History Past Medical History:  Diagnosis Date   Cancer (HCC)    stomach    Crohn disease (HCC)    Essential hypertension 03/18/2022   PVNS (pigmented villonodular synovitis)    Sarcoidosis     Past Surgical History Past Surgical History:  Procedure Laterality Date   colon reconstruction     GASTRECTOMY  2014   OOPHORECTOMY      Social History:  - Moved from [place] 4 years ago - Lives by herself and her dog - Independent in all ADLs/IADLs - PCP: Dr. Versie Starks  (patient is in the process of establishing care elsewhere though) - Substance - No alcohol use, no marijuana use since 1999  Family History:  - Aunt has breast cancer on Dads side  - Mom passed at 35 from sarcoidosis  Family History:  Family History  Problem Relation Age of Onset   Sarcoidosis Mother    Stomach cancer Paternal Uncle  Allergies: Allergies as of 05/22/2023 - Review Complete 05/22/2023  Allergen Reaction Noted   Tape Other (See Comments) 03/16/2022    Review of Systems: A complete ROS was negative except as per HPI.   OBJECTIVE:   Physical Exam: Blood pressure 110/78, pulse 85, temperature (!) 97.4 F (36.3 C), temperature source Oral, resp. rate (!) 21, height 5\' 1"  (1.549 m), weight 64.9 kg, SpO2 96%.  Constitutional: lying in bed, NAD HENT: Right-sided ear and buccal swelling that extends to right lower mandible, no appreciable erythema/warmth, mild tenderness to palpation. Right ear  canal with ear wick placed.  Eyes: conjunctiva non-erythematous Neck: Supple Cardiovascular: regular rate and rhythm, no m/r/g, no LEE Pulmonary/Chest: normal work of breathing on room air, lungs clear to auscultation bilaterally Abdominal: right -sided port-a-catch without surrounding erythema, central longitudinal scar from prior surgeries, distended but soft, non-tender MSK: normal bulk and tone Skin: warm and dry   Labs: CBC    Component Value Date/Time   WBC 23.5 (H) 05/22/2023 2123   RBC 3.81 (L) 05/22/2023 2123   HGB 12.2 05/22/2023 2123   HGB 10.8 (L) 03/18/2022 1632   HCT 39.4 05/22/2023 2123   HCT 32.6 (L) 03/18/2022 1632   PLT 316 05/22/2023 2123   PLT 325 03/18/2022 1632   MCV 103.4 (H) 05/22/2023 2123   MCV 98 (H) 03/18/2022 1632   MCH 32.0 05/22/2023 2123   MCHC 31.0 05/22/2023 2123   RDW 14.8 05/22/2023 2123   RDW 13.5 03/18/2022 1632   LYMPHSABS 0.8 05/22/2023 2123   LYMPHSABS 0.4 (L) 03/18/2022 1632   MONOABS 0.6 05/22/2023 2123   EOSABS 0.0 05/22/2023 2123   EOSABS 0.0 03/18/2022 1632   BASOSABS 0.0 05/22/2023 2123   BASOSABS 0.0 03/18/2022 1632     CMP     Component Value Date/Time   NA 134 (L) 05/22/2023 2200   NA 141 03/18/2022 1629   K 5.0 05/22/2023 2200   CL 108 05/22/2023 2200   CO2 22 05/22/2023 2200   GLUCOSE 129 (H) 05/22/2023 2200   BUN 36 (H) 05/22/2023 2200   BUN 28 (H) 03/18/2022 1629   CREATININE 1.13 (H) 05/22/2023 2200   CALCIUM 9.5 05/22/2023 2200   PROT 6.8 05/22/2023 2200   PROT 6.4 03/18/2022 1629   ALBUMIN 3.4 (L) 05/22/2023 2200   ALBUMIN 4.1 03/18/2022 1629   AST 18 05/22/2023 2200   ALT 13 05/22/2023 2200   ALKPHOS 48 05/22/2023 2200   BILITOT 0.7 05/22/2023 2200   BILITOT 0.3 03/18/2022 1629   GFRNONAA >60 05/22/2023 2200   GFRAA >60 02/25/2020 2351    Imaging:  CT temporal bone showing: IMPRESSION: 1.  No evidence of mastoiditis. 2. Hyperenhancement in the imaged portion of the right parotid gland, which  may be reactive or represent parotitis.   CT abdomen pelvis:  Status post distal gastrectomy with gastrojejunostomy.  Eccentric wall thickening with narrowing of the gastrojejunostomy just distal to the anastomosis. Suspected functional obstruction with moderate debris in the stomach.  No evidence of small bowel obstruction.   ASSESSMENT & PLAN:   Assessment & Plan by Problem: Principal Problem:   Cellulitis of right ear Active Problems:   Cellulitis of ear   Analyce C Kulikowski is a 47 y.o.female with PMH of pulmonary sarcoidosis, anemia of chronic disease, HTN, gastric cancer s/p resection and chemotherapy in 2014 who presented to [facility] on 12/8 with right-sided ear/facial pain/swelling and n/v.  #Suspected Otitis Externa vs. Cellulitis #Suspected Parotitis  Patient presents  with right ear pain/swelling that progressed to involve right side of face. Afebrile, leukocytosis  = 23.5 with neutrophilia (patient has been on steroids since 11/19 for sarcoidosis flare (see below). Lactic acid wnl. At [facility] ED, providers appreciated mild purulence of the external ear canal and she was given an ear wick with 10 drops of Ofloxacin. Unfortunately no culture taken of drainage. Also given one dose of IV Clindamycin and started on IV Cipro (now complete) given initial concerns of cellulitis versus malignant otitis externa. CT did not show evidence of mastoiditis but noted "extensive hyperenhancement and skin thickening with multiple low-density collections in the superficial soft tissues of the right ear, consistent with cellulitis". Unclear exact etiology of presenting symptoms. Patient is immunocompromised in the setting of current steroid therapy. From chart review, it seems chemotherapy ended in 2014. Of note, she was hospitalized for MRSA bacteremia/PNA and had port-a-cath exchanged at that time. Unclear why she still has this current one in. In regards to concern for malignant otitis  externa, she has no history of diabetes but A1c was 6.8 07/2022 thought to be 2/2 to steroids. No bony erosion on imaging. No failed Abx therapy for above. She did infrequently take a few Bactrim that was prescribed at the same time as prednisone to prevent opportunistic PNA. Vitals are unremarkable and leukocytosis less concerning given weeks of steroid therapy.  -Consider AM ENT consult -Start Levofloxacin 500 mg/daily with plan for 7 days to cover Pseudomonas/Staph/Strep -Start Cipro/Hydrocortisone 3 drops BID  -Order ESR and CRP  -Toradol/Tylenol for pain  #Functional Bowel Obstruction #LLQ Pain Patient with n/v X 3 weeks. Able to tolerate eating/drinking. No change in bowel urinary habits. CTAP showed "Suspected functional obstruction with moderate debris in the stomach. No evidence of small bowel obstruction". Denies marijuana use. -Consider AM GI consult -NPO; sips with meds -IV Phenergan  -LR @ 75 ml/hr  Chronic Conditions:  #Sarcoidosis: Patient had been in remission until November. Pulmonologist started Prednisone 30 mg for 4 weeks followed by 20 mg taper until follow-up. Patient endorsing mild SOB on ROS but no remarkable findings on physical exam. Saturating well on RA.  -Holding Prednisone for now in the setting of above -Continue home Symbicort [facility]) and Albuterol  #Hypertension Patient prescribed Lisinopril 10 mg/daily but states she has been taking up to 5 pills per day due to elevated at home BP readings in the 180-190's. SBP 110-170's since admission. -Start home Lisinopril and increase/add additional agent as needed  #Anemia of Chronic Disease Hgb is 12.2.  #Breast Mass Patient has two left-sided breast lumps and after mammogram, the recommendation was for biopsy which she has not done. -Follow-up as outpatient  Diet: NPO VTE: DOAC IVF: LR @ 75 ml/hr Code: Full   Dispo: Admit patient to Observation with expected length of stay less than 2  midnights.  Signed: Carmina Mccreery, DO Internal Medicine Resident PGY-1 05/23/2023, 4:34 AM

## 2023-05-23 NOTE — ED Notes (Signed)
Report given to carelink. Will arrive in 15 minutes

## 2023-05-24 ENCOUNTER — Other Ambulatory Visit (HOSPITAL_COMMUNITY): Payer: Self-pay

## 2023-05-24 DIAGNOSIS — H6011 Cellulitis of right external ear: Secondary | ICD-10-CM | POA: Diagnosis not present

## 2023-05-24 LAB — BASIC METABOLIC PANEL
Anion gap: 8 (ref 5–15)
BUN: 27 mg/dL — ABNORMAL HIGH (ref 6–20)
CO2: 24 mmol/L (ref 22–32)
Calcium: 8.8 mg/dL — ABNORMAL LOW (ref 8.9–10.3)
Chloride: 105 mmol/L (ref 98–111)
Creatinine, Ser: 1 mg/dL (ref 0.44–1.00)
GFR, Estimated: >60 mL/min (ref >60)
Glucose, Bld: 125 mg/dL — ABNORMAL HIGH (ref 70–99)
Potassium: 5.3 mmol/L — ABNORMAL HIGH (ref 3.5–5.1)
Sodium: 137 mmol/L (ref 135–145)

## 2023-05-24 LAB — CBC
HCT: 34.1 % — ABNORMAL LOW (ref 36.0–46.0)
Hemoglobin: 10.6 g/dL — ABNORMAL LOW (ref 12.0–15.0)
MCH: 31.9 pg (ref 26.0–34.0)
MCHC: 31.1 g/dL (ref 30.0–36.0)
MCV: 102.7 fL — ABNORMAL HIGH (ref 80.0–100.0)
Platelets: 279 10*3/uL (ref 150–400)
RBC: 3.32 MIL/uL — ABNORMAL LOW (ref 3.87–5.11)
RDW: 14.5 % (ref 11.5–15.5)
WBC: 14.3 10*3/uL — ABNORMAL HIGH (ref 4.0–10.5)
nRBC: 0 % (ref 0.0–0.2)

## 2023-05-24 LAB — POTASSIUM: Potassium: 5.3 mmol/L — ABNORMAL HIGH (ref 3.5–5.1)

## 2023-05-24 LAB — HIV ANTIBODY (ROUTINE TESTING W REFLEX): HIV Screen 4th Generation wRfx: NONREACTIVE

## 2023-05-24 MED ORDER — LEVOFLOXACIN 750 MG PO TABS
750.0000 mg | ORAL_TABLET | Freq: Every day | ORAL | Status: DC
  Filled 2023-05-24: qty 1

## 2023-05-24 MED ORDER — PREDNISONE 20 MG PO TABS
ORAL_TABLET | ORAL | 0 refills | Status: DC
  Filled 2023-05-24: qty 42, 37d supply, fill #0

## 2023-05-24 MED ORDER — SODIUM ZIRCONIUM CYCLOSILICATE 5 G PO PACK
5.0000 g | PACK | Freq: Once | ORAL | Status: AC
  Administered 2023-05-24: 5 g via ORAL
  Filled 2023-05-24: qty 1

## 2023-05-24 MED ORDER — CIPROFLOXACIN-HYDROCORTISONE 0.2-1 % OT SUSP
3.0000 [drp] | Freq: Two times a day (BID) | OTIC | 0 refills | Status: AC
  Filled 2023-05-24: qty 10, 33d supply, fill #0

## 2023-05-24 MED ORDER — LEVOFLOXACIN 750 MG PO TABS
750.0000 mg | ORAL_TABLET | Freq: Every day | ORAL | 0 refills | Status: AC
  Filled 2023-05-24: qty 6, 6d supply, fill #0

## 2023-05-24 MED ORDER — PANTOPRAZOLE SODIUM 40 MG PO TBEC
40.0000 mg | DELAYED_RELEASE_TABLET | Freq: Every day | ORAL | 0 refills | Status: AC
  Filled 2023-05-24: qty 7, 7d supply, fill #0

## 2023-05-24 MED ORDER — KETOROLAC TROMETHAMINE 10 MG PO TABS
10.0000 mg | ORAL_TABLET | Freq: Four times a day (QID) | ORAL | 0 refills | Status: AC | PRN
  Filled 2023-05-24: qty 12, 3d supply, fill #0

## 2023-05-24 MED ORDER — PROMETHAZINE HCL 12.5 MG PO TABS
12.5000 mg | ORAL_TABLET | Freq: Four times a day (QID) | ORAL | 0 refills | Status: AC | PRN
  Filled 2023-05-24: qty 12, 3d supply, fill #0

## 2023-05-24 NOTE — Progress Notes (Signed)
   05/24/23 1156  TOC Brief Assessment  Insurance and Status Reviewed  Patient has primary care physician Yes  Home environment has been reviewed alone  Prior level of function: independent  Prior/Current Home Services No current home services  Social Determinants of Health Reivew SDOH reviewed no interventions necessary  Readmission risk has been reviewed Yes  Transition of care needs no transition of care needs at this time     Transition of Care Department [facility]) has reviewed patient and no TOC needs have been identified at this time. We will continue to monitor patient advancement through interdisciplinary progression rounds. If new patient transition needs arise, please place a TOC consult.

## 2023-05-24 NOTE — Discharge Instructions (Addendum)
Kristina Graves, She came to the hospital for concerns about right ear pain and swelling with 3 weeks of ongoing nausea and vomiting.  We gave you fluids, antibiotics, pain medications.  For your right ear pain/swelling -Cipro HC otic, Place 3 drops into the right ear 2 times daily for 6 days starting tomorrow -Levofloxacin 750 mg take 1 tablet by mouth for 6 days starting tomorrow -Toradol 10 mg, take 1 tablet by mouth every 6 hours as needed for moderate to severe pain, for 3 days  For your nausea and vomiting -Phenergan 12.5 mg, take 1 tablet by mouth every 6 hours as needed for nausea and vomiting, we are sending you 3 days worth. -Fully advance your diet from soft to solid  For your pulmonary sarcoidosis Please take your prednisone that you have at home as following: - Prednisone 30 mg, take 1-1/2 tablet by mouth daily for 9 days starting tomorrow, then take 20 mg, 1 tablet by mouth daily for 28 days. - Once you finish your antibiotic, Levaquin on December 15, take Bactrim that you have at home only on December 16 Monday and December 18 on Wednesday.   - You do not have to take the Bactrim once you are taking the 20 mg of the prednisone.  For your blood pressure, please continue taking your lisinopril 10 mg daily For your breast mass, please follow-up for biopsy.  Please follow-up with your pulmonologist on December 16.  Please follow-up with your primary care physician at [facility] on December 14.  If you have any of these following symptoms, please call us or seek care at an emergency department: -Chest Pain -Difficulty Breathing -Worsening abdominal pain -Syncope (passing out) -Drooping of face -Slurred speech -Sudden weakness in your leg or arm -Fever -Chills -blood in the stool -dark black, sticky stool  If you have any questions or concerns, call our clinic at [phone] or after hours call [phone] and ask for the internal medicine resident on call.  I am  glad you are feeling better. It was a pleasure taking care for you. I wish a good recovery and good health!   Dr. Jeral Pinch

## 2023-05-24 NOTE — Plan of Care (Signed)

## 2023-05-24 NOTE — Plan of Care (Signed)
  Problem: Education: Goal: Knowledge of General Education information will improve Description: Including pain rating scale, medication(s)/side effects and non-pharmacologic comfort measures Outcome: Progressing   Problem: Nutrition: Goal: Adequate nutrition will be maintained Outcome: Progressing   Problem: Coping: Goal: Level of anxiety will decrease Outcome: Progressing   Problem: Pain Management: Goal: General experience of comfort will improve Outcome: Progressing

## 2023-05-24 NOTE — Discharge Summary (Signed)
Name: Kristina Graves MRN: [id number] DOB: 1976-03-24 47 y.o. PCP: Annett Fabian, MD  Date of Admission: 05/22/2023  8:24 PM Date of Discharge:  05/24/2023 Attending Physician: Dr. Cleda Daub  DISCHARGE DIAGNOSIS:  Primary Problem: Cellulitis of right ear   Hospital Problems: Principal Problem:   Cellulitis of right ear Active Problems:   Cellulitis of ear   Partial gastric outlet obstruction    DISCHARGE MEDICATIONS:   Allergies as of 05/24/2023       Reactions   Tape Rash        Medication List     STOP taking these medications    amoxicillin-clavulanate 500-125 MG tablet Commonly known as: AUGMENTIN   ibuprofen 200 MG tablet Commonly known as: ADVIL       TAKE these medications    acetaminophen 500 MG tablet Commonly known as: TYLENOL Take 1,000 mg by mouth as needed for mild pain (pain score 1-3) or moderate pain (pain score 4-6).   albuterol 108 (90 Base) MCG/ACT inhaler Commonly known as: VENTOLIN HFA Inhale 1-2 puffs into the lungs every 6 (six) hours as needed for wheezing or shortness of breath. What changed: how much to take   budesonide-formoterol 160-4.5 MCG/ACT inhaler Commonly known as: SYMBICORT Inhale 2 puffs into the lungs 2 (two) times daily. What changed:  when to take this reasons to take this   ciprofloxacin-hydrocortisone OTIC suspension Commonly known as: CIPRO HC OTIC Place 3 drops into the right ear 2 (two) times daily for 6 days.   DULoxetine 30 MG capsule Commonly known as: CYMBALTA Take 30 mg by mouth daily.   fluticasone furoate-vilanterol 200-25 MCG/ACT Aepb Commonly known as: Breo Ellipta Inhale 1 puff into the lungs daily. What changed:  how much to take when to take this reasons to take this   ketorolac 10 MG tablet Commonly known as: TORADOL Take 1 tablet (10 mg total) by mouth every 6 (six) hours as needed for up to 3 days for moderate pain (pain score 4-6) or severe pain (pain score 7-10).    levofloxacin 750 MG tablet Commonly known as: LEVAQUIN Take 1 tablet (750 mg total) by mouth daily for 6 days.   lisinopril 10 MG tablet Commonly known as: ZESTRIL Take 20 mg by mouth in the morning and at bedtime.   pantoprazole 40 MG tablet Commonly known as: PROTONIX Take 1 tablet (40 mg total) by mouth daily for 7 days. Start taking on: May 25, 2023   predniSONE 20 MG tablet Commonly known as: DELTASONE Take 1.5 tablets (30 mg total) by mouth daily with breakfast for 28 days, THEN 1 tablet (20 mg total) daily with breakfast for 28 days. Start taking on: May 03, 2023   pregabalin 150 MG capsule Commonly known as: LYRICA Take 150 mg by mouth 2 (two) times daily.   promethazine 12.5 MG tablet Commonly known as: PHENERGAN Take 1 tablet (12.5 mg total) by mouth every 6 (six) hours as needed for up to 3 days for nausea or vomiting.   sulfamethoxazole-trimethoprim 800-160 MG tablet Commonly known as: BACTRIM DS Take 1 tablet daily on MWF What changed:  how much to take how to take this when to take this additional instructions        DISPOSITION AND FOLLOW-UP:  Ms.Ares C Nieves was discharged from [facility] in Stable condition. At the hospital follow up visit please address:  Right ear cellulitis/right parotitis: Patient is discharged with 6-day course of p.o. Levaquin 750 mg daily,  Cipro HC otic drops for 6 days, Toradol 10 mg as needed, Phenergan 12.5 mg as needed.  Please evaluate patient symptoms, for resolution. 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  She has a follow-up ointment with pulmonologist on December 16.  She is able to tolerate diet, denies any more nausea or vomiting. AKI: Resolved.  Hyperkalemia: K 5.3, given lokelma, please check K.   Follow-up  Recommendations: Consults: GI Labs: Basic Metabolic Profile Studies: None  Medications: Levaquin 750 mg daily for 6 days, Cipro HC otic drops for 6 days, Toradol 10 mg as needed for 3 days, Phenergan 12.5 mg as needed for 3 days.  No other changes were made to her home medications.  She was advised to take the Bactrim once she has finished her Levaquin p.o. dose on December 15, she can take the Bactrim on December 16 Monday, December 18 Wednesday.  After that she does not have to take the prophylactic Bactrim once she is taking the prednisone 20 mg.  Follow-up Appointments:  Follow-up Information     [facility] Follow up on 05/28/2023.   Why: Hospital f/u Contact information: [address] [place] [address] [phone]         Glenford Bayley, NP Follow up on 05/30/2023.   Specialty: Pulmonary Disease Contact information: [address] [place] [address] [phone]                 HOSPITAL COURSE:  Patient Summary: Plan: #Right Ear Cellulitis,  #Right Parotitis  Presented with progressive R ear pain/swelling/hearing difficulty started after Thanksgiving, progressing to right facial swelling. Afebrile, initially presented with WBC 23.5 with neutrophilia (on steroids since 11/19 for sarcoid), Lactic acid wnl. At [facility] ED, providers appreciated mild purulence of the external ear canal and she was given an ear wick with 10 drops of Ofloxacin, 1 dose of IV Clindamycin.  CT showed extensive hyperenhancement and skin thickening with multiple low-density collections in the superficial soft tissue of the right ear consistent with cellulitis and hyperenhancement of the right parotid gland she can be reactive. No evidence of mastoiditis. Middle ear cavity normal, inner ear structures normal.  Internal auditory and facial nerve canals normal. Initially started on PO Levofloxacin 500 mg to cover for Pseudomonas/staph/strep systemically, then  transition to 750 every 48 hours, she received one dose, as her Cr function improved, she is transitioned back to Levo 750 mg every 24 hours to complete 7 day course.  She has also been receiving a Tylenol 1000 mg every 6 hours and IV Toradol 15 mg every 6 hours as needed for pain control.  Otherwise per evaluation today, swelling and redness of her right ear had improved.  She is stable to go home with the Cipro HC attic drops and p.o. Levaquin to complete a 6-day course.  She is also sent home with couple of days of Toradol for pain management.  Patient is advised to follow back with the primary care doctor whom she has appointment scheduled on Saturday.      #Functional bowel obstruction #Hx of pulmonary sarcoidosis #Remote hx of Crohn's disease Presented with nausea and vomiting for 3 weeks, with changes in bowel movement. Reports she has been having multiple of small bowel movements through out the day that is different for her, for the past 2  months. Denies any nausea or vomiting since last night. Continues to endorse abdominal pain. 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 States she has an appointment scheduled with [facility] tomorrow to establish care with gastroenterology for her ongoing n/v and abnormal BMs. CRP and ESR elevated 5.8, 40 respectively, though decreased compared to 11/12, pt has an appointment with rheumatology on 09/2023. She is currently afebrile, with WBC trending downwards.  During this admission, her diet was slowly advanced, she has been tolerating her diet well.  She was given Protonix 40 mg daily for acid reflux and Phenergan 12.5 mg IV as needed for nausea and vomiting.  She tolerated those well.  She was also started on her home medication prednisone taper 30 mg.  We  also continue her Dulera and Proventil.  She did reports she has next follow-up ointment with pulmonologist on December 16.  Otherwise per evaluation today, she denied any nausea or vomiting.  Reported improved abdominal pain.  She reports that she tolerated her diet well.  States that she has a follow-up ointment on December 14 with the [place] clinic. Highly recommend OP f/u with GI with pt's hx of sarcoid and crohns.    AKI Cr elevated 1.36 [1.13] GFR 48, likely pre-renal in the setting of external GI losses. Currently receiving maintenance fluids.  Resolved.  Hyperkalemia Potassium was noted to be 5.3, repeat 5.3, gave her lokelma.  Otherwise patient is asymptomatic, no chest pain, no shortness of breath, no palpitations.  She is advised to follow-up with repeat potassium when she sees her primary care physician on Saturday.   Chronic conditions: HTN: Continue home medication lisinopril 10 mg daily. Anemia of chronic disease: Hemoglobin stable.  Breast Mass: per mammo, two left sided breast lumps, patient currently rejected biopsy, would like to take care of it later.    DISCHARGE INSTRUCTIONS:   Discharge Instructions     Diet - low sodium heart healthy   Complete by: As directed    Discharge instructions   Complete by: As directed    Ms. Hyacinth Meeker, She came to the hospital for concerns about right ear pain and swelling with 3 weeks of ongoing nausea and vomiting.  We gave you fluids, antibiotics, pain medications.  For your right ear pain/swelling -Cipro HC otic, Place 3 drops into the right ear 2 times daily for 6 days starting tomorrow -Levofloxacin 750 mg take 1 tablet by mouth for 6 days starting tomorrow -Toradol 10 mg, take 1 tablet by mouth every 6 hours as needed for moderate to severe pain, for 3 days  For your nausea and vomiting -Phenergan 12.5 mg, take 1 tablet by mouth every 6 hours as needed for nausea and vomiting, we are sending you 3 days worth. -Fully advance your  diet from soft to solid  For your pulmonary sarcoidosis Please take your prednisone that you have at home as following: - Prednisone 30 mg, take 1-1/2 tablet by mouth daily for 9 days starting tomorrow, then take 20 mg, 1 tablet by mouth daily for 28 days. - Once you finish your antibiotic, Levaquin on December 15, take Bactrim that you have at home only on December 16 Monday and December 18 on Wednesday.  You do not have to take the Bactrim once you are taking the 20 mg of the prednisone.  For your blood pressure,  please continue taking your lisinopril 10 mg daily For your breast mass, please follow-up for biopsy.  Please follow-up with your pulmonologist on December 16.  Please follow-up with your primary care physician at [facility] on December 14.  If you have any of these following symptoms, please call us or seek care at an emergency department: -Chest Pain -Difficulty Breathing -Worsening abdominal pain -Syncope (passing out) -Drooping of face -Slurred speech -Sudden weakness in your leg or arm -Fever -Chills -blood in the stool -dark black, sticky stool  If you have any questions or concerns, call our clinic at [phone] or after hours call [phone] and ask for the internal medicine resident on call.  I am glad you are feeling better. It was a pleasure taking care for you. I wish a good recovery and good health!   Dr. Jeral Pinch   Increase activity slowly   Complete by: As directed        SUBJECTIVE:  Patient is evaluated bedside today, she reports overall she is doing much better.  She is able to tolerate diet.  She reports that her swelling has improved compared to yesterday.  She denies any nausea, vomiting.  Reports her abdominal pain has improved.  Reports she is having bowel movements.  Denies any shortness of breath or chest pain.  Denies any other fever episodes.  All questions were answered.  She is stable to go home today and follow-up with her  primary care scheduled on Saturday.  She also an appointment scheduled with the pulmonologist on December 16.  Discharge Vitals:   BP (!) 161/112 (BP Location: Right Leg)   Pulse 78   Temp 98.8 F (37.1 C) (Oral)   Resp 16   Ht 5\' 1"  (1.549 m)   Wt 64.9 kg   SpO2 100%   BMI 27.02 kg/m   OBJECTIVE:  Physical Exam   General: no acute distress HENT: Atraumatic, normocephalic, no periorbital swelling. Ears: + R ear periauricular erythema, swelling, and tenderness improved, +postauricular swelling and erythema improved, ear wick in place. No cervical lymph nodes appreciated, External ear canal unable to visualize due to swelling.  Cardiovascular: Regular rate, regular rhythm, no murmurs rubs or gallops Pulmonary: Normal work of breathing, no wheezing crackles. Abdomen: Soft, nondistended, nontender, bowel sounds present. MSK: Range of motion intact.  Pertinent Labs, Studies, and Procedures:     Latest Ref Rng & Units 05/24/2023    6:38 AM 05/23/2023    6:15 AM 05/22/2023    9:23 PM  CBC  WBC 4.0 - 10.5 K/uL 14.3  16.1  23.5   Hemoglobin 12.0 - 15.0 g/dL 36.6  44.0  34.7   Hematocrit 36.0 - 46.0 % 34.1  34.4  39.4   Platelets 150 - 400 K/uL 279  259  316        Latest Ref Rng & Units 05/24/2023    9:42 AM 05/24/2023    6:38 AM 05/23/2023    6:15 AM  CMP  Glucose 70 - 99 mg/dL  425  96   BUN 6 - 20 mg/dL  27  31   Creatinine 9.56 - 1.00 mg/dL  3.87  5.64   Sodium 332 - 145 mmol/L  137  135   Potassium 3.5 - 5.1 mmol/L 5.3  5.3  4.8   Chloride 98 - 111 mmol/L  105  103   CO2 22 - 32 mmol/L  24  23   Calcium 8.9 - 10.3 mg/dL  8.8  9.0  CT Temporal Bones W Contrast  Result Date: 05/23/2023 CLINICAL DATA:  Hearing loss, mixed mastoiditis EXAM: CT TEMPORAL BONES WITH CONTRAST TECHNIQUE: Axial and coronal plane CT imaging of the petrous temporal bones was performed with thin-collimation image reconstruction following intravenous contrast administration. Multiplanar CT image  reconstructions were also generated. RADIATION DOSE REDUCTION: This exam was performed according to the departmental dose-optimization program which includes automated exposure control, adjustment of the mA and/or kV according to patient size and/or use of iterative reconstruction technique. CONTRAST:  OMNIPAQUE IOHEXOL 300 MG/ML  SOLN COMPARISON:  No prior CT of the temporal bones available, correlation is made with CT head 07/05/2022 FINDINGS: RIGHT TEMPORAL BONE External auditory canal: Extensive hyperenhancement with multiple low-density collections in the superficial soft tissues of the right ear (series 302, image 14 and series 602, image 50). The osseous portion of the external auditory canal is unremarkable. Middle ear cavity: Normally aerated. The scutum and ossicles are normal. The tegmen tympani is intact. Inner ear structures: The cochlea, vestibule and semicircular canals are normal. The vestibular aqueduct is not enlarged. Internal auditory and facial nerve canals:  Normal Mastoid air cells: Normally aerated. No osseous erosion. LEFT TEMPORAL BONE External auditory canal: Normal. Middle ear cavity: Normally aerated. The scutum and ossicles are normal. The tegmen tympani is intact. Inner ear structures: The cochlea, vestibule and semicircular canals are normal. The vestibular aqueduct is not enlarged. Internal auditory and facial nerve canals:  Normal. Mastoid air cells: Normally aerated. No osseous erosion. Vascular: Normal post-contrast appearance of the vasculature. Limited intracranial:  No acute or significant finding. Visible orbits/paranasal sinuses: Mucosal thickening in the ethmoid air cells. Remote right lamina papyracea fracture. No acute finding in the orbits. Soft tissues: In addition to the extensive hyperenhancement and skin thickening with multiple low-density collections in the right periauricular soft tissues, there is hyperenhancement in the imaged portion of the right parotid  gland (series 302, image 2 and series 602, image 49). IMPRESSION: 1. Extensive hyperenhancement and skin thickening with multiple low-density collections in the superficial soft tissues of the right ear, consistent with cellulitis. No evidence of mastoiditis. 2. Hyperenhancement in the imaged portion of the right parotid gland, which may be reactive or represent parotitis. Electronically Signed   By: Wiliam Ke M.D.   On: 05/23/2023 00:34   CT ABDOMEN PELVIS W CONTRAST  Result Date: 05/23/2023 CLINICAL DATA:  Left lower quadrant abdominal pain EXAM: CT ABDOMEN AND PELVIS WITH CONTRAST TECHNIQUE: Multidetector CT imaging of the abdomen and pelvis was performed using the standard protocol following bolus administration of intravenous contrast. RADIATION DOSE REDUCTION: This exam was performed according to the departmental dose-optimization program which includes automated exposure control, adjustment of the mA and/or kV according to patient size and/or use of iterative reconstruction technique. CONTRAST:  OMNIPAQUE IOHEXOL 300 MG/ML  SOLN COMPARISON:  02/07/2022 FINDINGS: Lower chest: Bases are clear. Hepatobiliary: Liver is within normal limits. Gallbladder is decompressed. No intrahepatic or extrahepatic ductal dilatation. Pancreas: Parenchymal atrophy. Spleen: Within normal limits. Adrenals/Urinary Tract: Adrenal glands are within normal limits. Right kidney is within normal limits. 2.1 cm simple left upper pole renal cyst (series 301/image 35), benign (Bosniak I). No follow-up is recommended. No hydronephrosis. Bladder is within normal limits. Stomach/Bowel: Status post distal gastrectomy with gastrojejunostomy. Moderately distended stomach with debris. Eccentric wall thickening with narrowing just distal to the anastomosis of the gastrojejunostomy (series 301/images 30-31). This appearance raises the possibility of functional obstruction at the gastrojejunostomy. No evidence of small bowel  obstruction. Appendix is not discretely visualized. No colonic wall thickening or inflammatory changes. Vascular/Lymphatic: No evidence of abdominal aortic aneurysm. No suspicious abdominopelvic lymphadenopathy. Reproductive: Uterus within normal limits. Bilateral ovaries are normal limits. Other: No abdominopelvic ascites. Musculoskeletal: Mild degenerative changes at L5-S1. Benign bone island at T11. IMPRESSION: Status post distal gastrectomy with gastrojejunostomy. Eccentric wall thickening with narrowing of the gastrojejunostomy just distal to the anastomosis. Suspected functional obstruction with moderate debris in the stomach. No evidence of small bowel obstruction. Electronically Signed   By: Charline Bills M.D.   On: 05/23/2023 00:30     Signed: Jeral Pinch, D.O.  Internal Medicine Resident, PGY-1 [facility]  Pager: [phone] 2:15 PM, 05/24/2023

## 2023-05-25 ENCOUNTER — Encounter: Payer: Self-pay | Admitting: Pulmonary Disease

## 2023-05-27 ENCOUNTER — Other Ambulatory Visit (HOSPITAL_BASED_OUTPATIENT_CLINIC_OR_DEPARTMENT_OTHER): Payer: Self-pay

## 2023-05-27 ENCOUNTER — Encounter (HOSPITAL_BASED_OUTPATIENT_CLINIC_OR_DEPARTMENT_OTHER): Payer: BLUE CROSS/BLUE SHIELD

## 2023-05-27 DIAGNOSIS — D869 Sarcoidosis, unspecified: Secondary | ICD-10-CM

## 2023-05-28 LAB — CULTURE, BLOOD (ROUTINE X 2)
Culture: NO GROWTH
Culture: NO GROWTH
Special Requests: ADEQUATE
Special Requests: ADEQUATE

## 2023-05-30 ENCOUNTER — Encounter (HOSPITAL_BASED_OUTPATIENT_CLINIC_OR_DEPARTMENT_OTHER): Payer: Self-pay

## 2023-05-30 ENCOUNTER — Encounter (HOSPITAL_BASED_OUTPATIENT_CLINIC_OR_DEPARTMENT_OTHER): Payer: BLUE CROSS/BLUE SHIELD

## 2023-06-01 ENCOUNTER — Telehealth: Payer: Self-pay | Admitting: Primary Care

## 2023-06-01 NOTE — Telephone Encounter (Signed)
Noted! Thank you

## 2023-06-01 NOTE — Telephone Encounter (Signed)
Good Morning Kristina Graves.[facility] has called this morning to state that this patient has called to cancel her appointment for her biopsy for the large lump in left breast and sated she will not be rescheduling it. She just wanted to inform you for liability purposes.

## 2023-06-02 ENCOUNTER — Telehealth: Payer: Self-pay | Admitting: Pulmonary Disease

## 2023-06-02 MED ORDER — PREDNISONE 10 MG PO TABS: ORAL_TABLET | ORAL | 0 refills | Status: AC

## 2023-06-02 NOTE — Telephone Encounter (Signed)
Patient is having a sarcoidosis flare and needs something to be prescribed for it. She has been using her inhalers but they have not been helping. Call back number [phone]  Pharmacy: [facility] on [address] in [place]

## 2023-06-02 NOTE — Telephone Encounter (Signed)
Called and spoke to patient.  She stated that she was recently discharged from hospital and labs showed that that she is currently in a sarcoidosis flare. She lives on the third floor of her apartment and she is having to stop to rest multiple times due to SOB.  C/o increased SOB, prod cough with brown to clear sputum and wheezing x3d   She is currently taking Bactrim 800mg  and prednisone 20mg . She is using Breo 6x daily, albuterol 10x daily and Symbicort 10x daily She completed course of abx that was prescribed during admission.  Neg home covid test.  Beth, please advise. Dr. Wynona Neat is unavailable.

## 2023-06-02 NOTE — Telephone Encounter (Signed)
Complicated history.  She is using inhalers way too frequently.  Breo should just be once a day.  Symbicort should be 2 puffs twice a day.  Ideally would not use both but okay to use both on the same day for now.  If she is needing albuterol every 2 hours if not improving I would recommend she goes to the emergency room for urgent evaluation.  We could offer her a acute visit but I am not sure we can see her in a timely manner.  She would need to be seen by tomorrow.  I will prescribe a prednisone taper, 40 mg for 7 days and 30 mg for 7 days then return to her 20 mg daily.  But I would advise she be seen in clinic or go to the ED for evaluation of her symptoms given severity and the amount of medicine she is using without improvement.

## 2023-06-02 NOTE — Telephone Encounter (Signed)
Routing to DOD since [facility] has left for the day

## 2023-06-02 NOTE — Telephone Encounter (Signed)
Patient is calling back to see if her prescriptions have been called in. Please call when ready.

## 2023-06-03 NOTE — Telephone Encounter (Signed)
I called and spoke with the pt  Notified of response per Dr Judeth Horn She verbalized understanding  Nothing further needed

## 2023-06-20 ENCOUNTER — Other Ambulatory Visit: Payer: Self-pay | Admitting: Family

## 2023-06-20 DIAGNOSIS — D649 Anemia, unspecified: Secondary | ICD-10-CM

## 2023-06-20 DIAGNOSIS — D72829 Elevated white blood cell count, unspecified: Secondary | ICD-10-CM

## 2023-06-21 ENCOUNTER — Inpatient Hospital Stay: Payer: BLUE CROSS/BLUE SHIELD

## 2023-06-21 ENCOUNTER — Inpatient Hospital Stay: Payer: BLUE CROSS/BLUE SHIELD | Admitting: Family

## 2023-06-22 ENCOUNTER — Ambulatory Visit (INDEPENDENT_AMBULATORY_CARE_PROVIDER_SITE_OTHER): Payer: BLUE CROSS/BLUE SHIELD | Admitting: Pulmonary Disease

## 2023-06-22 DIAGNOSIS — D869 Sarcoidosis, unspecified: Secondary | ICD-10-CM

## 2023-06-22 LAB — PULMONARY FUNCTION TEST
DL/VA % pred: 138 %
DL/VA: 6.12 ml/min/mmHg/L
DLCO cor % pred: 80 %
DLCO cor: 15.68 ml/min/mmHg
DLCO unc % pred: 72 %
DLCO unc: 14.14 ml/min/mmHg
FEF 25-75 Post: 1.48 L/s
FEF 25-75 Pre: 1.21 L/s
FEF2575-%Change-Post: 22 %
FEF2575-%Pred-Post: 54 %
FEF2575-%Pred-Pre: 44 %
FEV1-%Change-Post: 5 %
FEV1-%Pred-Post: 55 %
FEV1-%Pred-Pre: 52 %
FEV1-Post: 1.47 L
FEV1-Pre: 1.39 L
FEV1FVC-%Change-Post: 4 %
FEV1FVC-%Pred-Pre: 95 %
FEV6-%Change-Post: 1 %
FEV6-%Pred-Post: 56 %
FEV6-%Pred-Pre: 56 %
FEV6-Post: 1.83 L
FEV6-Pre: 1.8 L
FEV6FVC-%Pred-Post: 102 %
FEV6FVC-%Pred-Pre: 102 %
FVC-%Change-Post: 1 %
FVC-%Pred-Post: 55 %
FVC-%Pred-Pre: 54 %
FVC-Post: 1.83 L
FVC-Pre: 1.8 L
Post FEV1/FVC ratio: 80 %
Post FEV6/FVC ratio: 100 %
Pre FEV1/FVC ratio: 77 %
Pre FEV6/FVC Ratio: 100 %
RV % pred: 85 %
RV: 1.37 L
TLC % pred: 68 %
TLC: 3.22 L

## 2023-06-22 NOTE — Progress Notes (Signed)
 Full PFT Performed Today

## 2023-06-22 NOTE — Patient Instructions (Signed)
 Full PFT Performed Today

## 2023-06-23 ENCOUNTER — Inpatient Hospital Stay: Payer: BLUE CROSS/BLUE SHIELD | Admitting: Family

## 2023-06-23 ENCOUNTER — Inpatient Hospital Stay: Payer: BLUE CROSS/BLUE SHIELD | Attending: Hematology & Oncology

## 2023-07-12 ENCOUNTER — Encounter: Payer: Self-pay | Admitting: Pulmonary Disease

## 2023-07-12 ENCOUNTER — Ambulatory Visit (INDEPENDENT_AMBULATORY_CARE_PROVIDER_SITE_OTHER): Payer: BLUE CROSS/BLUE SHIELD | Admitting: Pulmonary Disease

## 2023-07-12 VITALS — BP 86/56 | HR 138 | Temp 98.3°F | Ht 61.0 in | Wt 146.4 lb

## 2023-07-12 DIAGNOSIS — R0602 Shortness of breath: Secondary | ICD-10-CM

## 2023-07-12 DIAGNOSIS — D869 Sarcoidosis, unspecified: Secondary | ICD-10-CM

## 2023-07-12 DIAGNOSIS — Z85028 Personal history of other malignant neoplasm of stomach: Secondary | ICD-10-CM

## 2023-07-12 MED ORDER — BUDESONIDE-FORMOTEROL FUMARATE 160-4.5 MCG/ACT IN AERO: 2.0000 | INHALATION_SPRAY | Freq: Two times a day (BID) | RESPIRATORY_TRACT | 5 refills | Status: AC

## 2023-07-12 MED ORDER — PROMETHAZINE-CODEINE 6.25-10 MG/5ML PO SYRP: 5.0000 mL | ORAL_SOLUTION | ORAL | 0 refills | Status: DC | PRN

## 2023-07-12 MED ORDER — PREDNISONE 20 MG PO TABS: 20.0000 mg | ORAL_TABLET | Freq: Every day | ORAL | 0 refills | Status: AC

## 2023-07-12 MED ORDER — DOXYCYCLINE HYCLATE 100 MG PO TABS: 100.0000 mg | ORAL_TABLET | Freq: Two times a day (BID) | ORAL | 0 refills | Status: AC

## 2023-07-12 MED ORDER — BENZONATATE 200 MG PO CAPS: 200.0000 mg | ORAL_CAPSULE | Freq: Three times a day (TID) | ORAL | 1 refills | Status: AC | PRN

## 2023-07-12 NOTE — Patient Instructions (Addendum)
Will call in a course of antibiotics for you -Doxycycline 100 p.o. twice daily for 10 days  Prednisone for 7 to 10 days  Continue using your inhaler, choose between the [facility] or Symbicort which want to stay on  Use albuterol as needed  Use a spacer with the inhaler as needed  Follow-up in 3 to 4-week  Benzonatate for cough

## 2023-07-12 NOTE — Progress Notes (Signed)
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  Has had a difficult couple years where she was admitted to the hospital for pneumonia, sepsis with MRSA infection spent a couple of weeks in the hospital  Readmitted in September 2024 for cellulitis pneumonia  Recent hospitalization for facial cellulitis  Coughing, wheezing, chest tightness, inability to tolerate activities Coughing all night not able to sleep  Diagnosed with stomach cancer in 2014 for which she had surgery Had a bronchoscopy during that hospitalization showing evidence of sarcoidosis in the lungs Questionable history of Crohn's-patient states she feels that this diagnosis was made because granulomas were found in the stomach   Had previously been on prednisone and Imuran for many years  Was able to wean off prednisone and Imuran  Was recently seen in the emergency room for a swollen knee Was also seen for hemoptysis which has since resolved, has not had hemoptysis for about a month now-she was seen in the emergency department with a significant amount of hemoptysis she stated  History of allergies, asthma-has been on Proventil  She is a nurse  History of smoking  Outpatient Encounter Medications as of 07/12/2023  Medication Sig   acetaminophen (TYLENOL) 500 MG tablet Take 1,000 mg by mouth as needed for mild pain (pain score 1-3) or moderate pain (pain score 4-6).   albuterol (VENTOLIN HFA) 108 (90 Base) MCG/ACT inhaler Inhale 1-2 puffs into the lungs every 6 (six) hours as needed for wheezing or shortness of breath. (Patient taking differently: Inhale 2 puffs into the lungs every 6 (six) hours as needed for wheezing or shortness of  breath.)   budesonide-formoterol (SYMBICORT) 160-4.5 MCG/ACT inhaler Inhale 2 puffs into the lungs 2 (two) times daily. (Patient taking differently: Inhale 2 puffs into the lungs as needed (wheezing/SOB).)   hydrochlorothiazide (HYDRODIURIL) 25 MG tablet Take 25 mg by mouth daily.   lisinopril (ZESTRIL) 10 MG tablet Take 20 mg by mouth in the morning and at bedtime.   mometasone-formoterol (DULERA) 200-5 MCG/ACT AERO Inhale 2 puffs into the lungs daily as needed for wheezing (only uses prn).   pregabalin (LYRICA) 150 MG capsule Take 150 mg by mouth 2 (two) times daily.   promethazine (PHENERGAN) 12.5 MG tablet Take 1 tablet (12.5 mg total) by mouth every 6 (six) hours as needed for up to 3 days for nausea or vomiting.   pantoprazole (PROTONIX) 40 MG tablet Take 1 tablet (40 mg total) by mouth daily for 7 days.   [DISCONTINUED] DULoxetine (CYMBALTA) 30 MG capsule Take 30 mg by mouth daily. (Patient not taking: Reported on 05/23/2023)   [DISCONTINUED] fluticasone furoate-vilanterol (BREO ELLIPTA) 200-25 MCG/ACT AEPB Inhale 1 puff into the lungs daily. (Patient not taking: Reported on 07/12/2023)   [DISCONTINUED] sulfamethoxazole-trimethoprim (BACTRIM DS) 800-160 MG tablet Take 1 tablet daily on MWF (Patient not taking: Reported on 07/12/2023)   No facility-administered encounter medications on file as of 07/12/2023.    Allergies as of 07/12/2023 - Review Complete 07/12/2023  Allergen Reaction Noted   Tape Rash 03/16/2022    Past Medical History:  Diagnosis Date   Cancer (HCC)    stomach    Crohn disease (HCC)  Essential hypertension 03/18/2022   PVNS (pigmented villonodular synovitis)    Sarcoidosis     Past Surgical History:  Procedure Laterality Date   colon reconstruction     GASTRECTOMY  2014   OOPHORECTOMY      Family History  Problem Relation Age of Onset   Sarcoidosis Mother    Stomach cancer Paternal Uncle     Social History   Socioeconomic History   Marital status:  Single    Spouse name: Not on file   Number of children: Not on file   Years of education: Not on file   Highest education level: Not on file  Occupational History   Not on file  Tobacco Use   Smoking status: Former    Types: Cigarettes   Smokeless tobacco: Never   Tobacco comments:    Social smoker.  Quit 2023.  Substance and Sexual Activity   Alcohol use: No   Drug use: No   Sexual activity: Not on file  Other Topics Concern   Not on file  Social History Narrative   Lives alone. Independent in ADLs and IADLs. Works as a travel Public house manager.    Social Drivers of Corporate investment banker Strain: Not on file  Food Insecurity: No Food Insecurity (05/23/2023)   Hunger Vital Sign    Worried About Running Out of Food in the Last Year: Never true    Ran Out of Food in the Last Year: Never true  Transportation Needs: No Transportation Needs (05/23/2023)   PRAPARE - Administrator, Civil Service (Medical): No    Lack of Transportation (Non-Medical): No  Physical Activity: Not on file  Stress: Not on file  Social Connections: Moderately Isolated (05/17/2022)   Social Connection and Isolation Panel [NHANES]    Frequency of Communication with Friends and Family: More than three times a week    Frequency of Social Gatherings with Friends and Family: Once a week    Attends Religious Services: 1 to 4 times per year    Active Member of [employer] or Organizations: No    Attends Banker Meetings: Never    Marital Status: Never married  Intimate Partner Violence: Not At Risk (05/23/2023)   Humiliation, Afraid, Rape, and Kick questionnaire    Fear of Current or Ex-Partner: No    Emotionally Abused: No    Physically Abused: No    Sexually Abused: No    Review of Systems  Respiratory:  Positive for cough, shortness of breath and wheezing.   Musculoskeletal:  Positive for arthralgias.    Vitals:   07/12/23 1623  BP: (!) 86/56  Pulse: (!) 138  Temp: 98.3 F (36.8 C)   SpO2: 98%     Physical Exam Constitutional:      Appearance: Normal appearance.  HENT:     Head: Normocephalic.     Mouth/Throat:     Mouth: Mucous membranes are moist.  Eyes:     General: No scleral icterus. Cardiovascular:     Rate and Rhythm: Normal rate and regular rhythm.     Heart sounds: No murmur heard.    No friction rub.  Pulmonary:     Effort: No respiratory distress.     Breath sounds: No stridor. No wheezing or rhonchi.  Musculoskeletal:     Cervical back: No rigidity or tenderness.  Neurological:     Mental Status: She is alert.  Psychiatric:        Mood and Affect: Mood normal.  Data Reviewed: Records from Dr. Adela Glimpse at [facility] reviewed -Add had spirometry performed showing normal spirometry -CT at the time did reveal some nodularity and adenopathy in August  Most recent CT scan 05/18/2023 with some linear scarring, some groundglass changes, 7 mm lung nodule on right  PFT with restrictive physiology  Assessment:  History of chronic sarcoidosis  History of arthralgia  Past history of stomach cancer  Bronchitis with cough, shortness of breath, wheezing  Still feels her sarcoidosis may be acting up at present Recently treated for facial cellulitis -Reluctant to put her on high-dose chronic steroids which was discussed with her   Plan/Recommendations: I did call in a prescription for doxycycline for 10 days  Prednisone 20 mg daily for 7 days  Promethazine with codeine for the cough  Will see her back in the office in about 4 to 6 weeks  Encouraged to call with significant concerns  Virl Diamond MD [facility] 07/12/2023, 4:30 PM  CC: Annett Fabian, MD

## 2023-07-13 ENCOUNTER — Telehealth: Payer: Self-pay | Admitting: Pulmonary Disease

## 2023-07-13 ENCOUNTER — Other Ambulatory Visit: Payer: Self-pay | Admitting: Pulmonary Disease

## 2023-07-13 MED ORDER — PROMETHAZINE-CODEINE 6.25-10 MG/5ML PO SYRP: 5.0000 mL | ORAL_SOLUTION | ORAL | 0 refills | Status: DC | PRN

## 2023-07-13 NOTE — Telephone Encounter (Signed)
I called and spoke with the pt  She is aware that the rx was already sent to [facility] [address]  She states that she can pick up from there  Nothing further needed

## 2023-07-14 ENCOUNTER — Other Ambulatory Visit: Payer: Self-pay | Admitting: Pulmonary Disease

## 2023-07-14 ENCOUNTER — Other Ambulatory Visit: Payer: Self-pay | Admitting: Student

## 2023-07-14 MED ORDER — PROMETHAZINE-CODEINE 6.25-10 MG/5ML PO SYRP: 5.0000 mL | ORAL_SOLUTION | ORAL | 0 refills | Status: AC | PRN

## 2023-07-14 MED ORDER — GUAIFENESIN-DM 100-10 MG/5ML PO SYRP: 5.0000 mL | ORAL_SOLUTION | ORAL | 0 refills | Status: AC | PRN

## 2023-07-14 NOTE — Telephone Encounter (Signed)
I called [facility] [address] [place] and Promethazine-Codeine prescription was not received.  Routing message to Dr. Wynona Neat to resend prescription.

## 2023-07-14 NOTE — Telephone Encounter (Signed)
Called pt to clarify pharmacy/med refills. Stated this is her first time using My Chart and does not need a refill on Protonix and her pulm doctor will handle phenw/codeine syrup. Also stated her new PCP is Dr Jenne Pane (pt preferred not to give further info).

## 2023-07-14 NOTE — Telephone Encounter (Signed)
I have spoken to patient. She is upset and concerned with the care she has been given by our office.she stated that during her visit on 1/29, she informed that nurse that her pharmacy needed to be updated and she was advised that this would have to be done at her next visit.  She stated that [facility] did not receive the Rx for Phenergan. According to our record, [facility]ergan [facility] [address] on 07/13/2023. Pt stated that [facility] [address] is no longer her preferred pharmacy. She [facility] [address]. I have updated her chart to reflect this. I apologized for the delay and expressed that I understood her concerns.    I spoke to [place] with [facility], who confirmed that Phenergan Rx was not received on 07/13/2023. However she does have a Rx of file for Phenergan 25mg  that Dr. Jenne Pane prescribed on 1/12. This Rx was picked up by pt.    Dr. Wynona Neat, please advise. Preferred pharmacy to [facility] [address].

## 2023-07-14 NOTE — Telephone Encounter (Signed)
Order for promethazine sent to [facility]

## 2023-07-14 NOTE — Telephone Encounter (Signed)
Per Dr. Wynona Neat via epic secure chat Rx has been sent to preferred pharmacy.   I have spoke to [place] [facility]. She stated that Rx was received, however they no longer cover this medication. They carry phenergan DM only.   I have relayed this information to Dr. Wynona Neat via epic secure chat. He sent in Robitussin DM.  Called back to [place] and spoke to [place] and verified that Robitussin DM was received.   Patient is aware of above message and voiced her understanding.   Nothing further needed.

## 2023-07-20 ENCOUNTER — Telehealth: Payer: Self-pay | Admitting: Pulmonary Disease

## 2023-07-20 NOTE — Telephone Encounter (Signed)
 Patient states the pharmacy ([facility] [address]) still does not have her cough medication. She is open to changing her pharmacy to anywhere that will have the medication. [facility] is [facility] [address] [facility] [place]. She is still sick. Routing directly to [facility] for development worker, international aid to handle.

## 2023-07-20 NOTE — Telephone Encounter (Signed)
 Spoke to [employer] with [facility]. He stated that Robitussin was received and is ready for pickup.  Lm for patient.

## 2023-07-21 ENCOUNTER — Telehealth: Payer: Self-pay | Admitting: Pulmonary Disease

## 2023-07-21 NOTE — Telephone Encounter (Signed)
 Patient would like to switch doctors. Would like to switch to Dr. Washington Hacker. Patient phone number is [phone].

## 2023-07-22 NOTE — Telephone Encounter (Signed)
 Called and spoke to patient.  She would like to switch from Dr. Gaynell Keeler to Dr. Washington Hacker, as she was told that [facility] specializes in sarcoidosis.  Dr. Gaynell Keeler and Dr. Washington Hacker, please advise. Thanks

## 2023-07-22 NOTE — Telephone Encounter (Signed)
 OK with me but please let patient know that I will be out for maternity leave in May. She can continue to see her current doctor until I return in August/September or try to see me once before I go on maternity leave.  Please schedule with 30 min slot depending on what patient decides.

## 2023-07-25 NOTE — Telephone Encounter (Signed)
 Attempted to call patient. Left patient a voicemail with info provided.

## 2023-07-27 NOTE — Telephone Encounter (Signed)
Okay with me

## 2023-07-27 NOTE — Telephone Encounter (Signed)
Lm x2 for patient.  Will close encounter per office protocol.

## 2023-07-28 NOTE — Telephone Encounter (Signed)
2nd ATC, left patient a voicemail

## 2023-08-04 ENCOUNTER — Other Ambulatory Visit: Payer: Self-pay | Admitting: Primary Care

## 2023-08-24 ENCOUNTER — Encounter: Payer: Self-pay | Admitting: Primary Care

## 2023-08-24 ENCOUNTER — Ambulatory Visit: Payer: BLUE CROSS/BLUE SHIELD | Admitting: Primary Care

## 2023-08-24 NOTE — Progress Notes (Deleted)
 @Patient  ID: Kristina Graves, female    DOB: May 22, 1976, 48 y.o.   MRN: [id number]  No chief complaint on file.   Referring provider: Annett Fabian, MD  HPI:   08/24/2023 Patient presents today for 6-week follow-up.  She was last seen by Dr. Wynona Neat in January for chronic sarcoidosis, she was treated for acute bronchitis with 10-day course of doxycycline, 7-day course of prednisone lung and promethazine with codeine for cough.  She was advised to choose between [facility] and Symbicort.    Allergies  Allergen Reactions   Tape Rash     There is no immunization history on file for this patient.  Past Medical History:  Diagnosis Date   Cancer (HCC)    stomach    Crohn disease (HCC)    Essential hypertension 03/18/2022   PVNS (pigmented villonodular synovitis)    Sarcoidosis     Tobacco History: Social History   Tobacco Use  Smoking Status Former   Types: Cigarettes  Smokeless Tobacco Never  Tobacco Comments   Social smoker.  Quit 2023.   Counseling given: Not Answered Tobacco comments: Social smoker.  Quit 2023.   Outpatient Medications Prior to Visit  Medication Sig Dispense Refill   acetaminophen (TYLENOL) 500 MG tablet Take 1,000 mg by mouth as needed for mild pain (pain score 1-3) or moderate pain (pain score 4-6).     albuterol (VENTOLIN HFA) 108 (90 Base) MCG/ACT inhaler Inhale 1-2 puffs into the lungs every 6 (six) hours as needed for wheezing or shortness of breath. (Patient taking differently: Inhale 2 puffs into the lungs every 6 (six) hours as needed for wheezing or shortness of breath.) 18 g 3   benzonatate (TESSALON) 200 MG capsule Take 1 capsule (200 mg total) by mouth 3 (three) times daily as needed for cough. 30 capsule 1   budesonide-formoterol (SYMBICORT) 160-4.5 MCG/ACT inhaler Inhale 2 puffs into the lungs 2 (two) times daily. 10 each 5   doxycycline (VIBRA-TABS) 100 MG tablet Take 1 tablet (100 mg total) by mouth 2 (two) times daily. 20 tablet 0    guaiFENesin-dextromethorphan (ROBITUSSIN DM) 100-10 MG/5ML syrup Take 5 mLs by mouth every 4 (four) hours as needed for cough. 236 mL 0   hydrochlorothiazide (HYDRODIURIL) 25 MG tablet Take 25 mg by mouth daily.     lisinopril (ZESTRIL) 10 MG tablet Take 20 mg by mouth in the morning and at bedtime.     mometasone-formoterol (DULERA) 200-5 MCG/ACT AERO Inhale 2 puffs into the lungs daily as needed for wheezing (only uses prn).     pantoprazole (PROTONIX) 40 MG tablet Take 1 tablet (40 mg total) by mouth daily for 7 days. 7 tablet 0   predniSONE (DELTASONE) 20 MG tablet Take 1 tablet (20 mg total) by mouth daily with breakfast. 7 tablet 0   pregabalin (LYRICA) 150 MG capsule Take 150 mg by mouth 2 (two) times daily.     promethazine (PHENERGAN) 12.5 MG tablet Take 1 tablet (12.5 mg total) by mouth every 6 (six) hours as needed for up to 3 days for nausea or vomiting. 12 tablet 0   No facility-administered medications prior to visit.      Review of Systems  Review of Systems   Physical Exam  There were no vitals taken for this visit. Physical Exam   Lab Results:  CBC    Component Value Date/Time   WBC 14.3 (H) 05/24/2023 0638   RBC 3.32 (L) 05/24/2023 0638   HGB 10.6 (L) 05/24/2023  1610   HGB 10.8 (L) 03/18/2022 1632   HCT 34.1 (L) 05/24/2023 0638   HCT 32.6 (L) 03/18/2022 1632   PLT 279 05/24/2023 0638   PLT 325 03/18/2022 1632   MCV 102.7 (H) 05/24/2023 0638   MCV 98 (H) 03/18/2022 1632   MCH 31.9 05/24/2023 0638   MCHC 31.1 05/24/2023 0638   RDW 14.5 05/24/2023 0638   RDW 13.5 03/18/2022 1632   LYMPHSABS 0.7 05/23/2023 0615   LYMPHSABS 0.4 (L) 03/18/2022 1632   MONOABS 0.7 05/23/2023 0615   EOSABS 0.1 05/23/2023 0615   EOSABS 0.0 03/18/2022 1632   BASOSABS 0.0 05/23/2023 0615   BASOSABS 0.0 03/18/2022 1632    BMET    Component Value Date/Time   NA 137 05/24/2023 0638   NA 141 03/18/2022 1629   K 5.3 (H) 05/24/2023 0942   CL 105 05/24/2023 0638   CO2 24  05/24/2023 0638   GLUCOSE 125 (H) 05/24/2023 0638   BUN 27 (H) 05/24/2023 0638   BUN 28 (H) 03/18/2022 1629   CREATININE 1.00 05/24/2023 0638   CALCIUM 8.8 (L) 05/24/2023 0638   GFRNONAA >60 05/24/2023 0638   GFRAA >60 02/25/2020 2351    BNP No results found for: "BNP"  ProBNP No results found for: "PROBNP"  Imaging: No results found.   Assessment & Plan:   No problem-specific Assessment & Plan notes found for this encounter.     Glenford Bayley, NP 08/24/2023

## 2023-09-27 ENCOUNTER — Encounter: Payer: BLUE CROSS/BLUE SHIELD | Admitting: Internal Medicine

## 2023-09-27 NOTE — Progress Notes (Deleted)
 Office Visit Note  Patient: Kristina Graves             Date of Birth: [birth year]           MRN: [id number]             PCP: No primary care provider on file. Referring: Antonio Baumgarten, NP Visit Date: 09/27/2023 Occupation: @GUAROCC @  Subjective:  No chief complaint on file.   History of Present Illness: Kristina Graves is a 48 y.o. female ***     Activities of Daily Living:  Patient reports morning stiffness for *** {minute/hour:19697}.   Patient {ACTIONS;DENIES/REPORTS:21021675::"Denies"} nocturnal pain.  Difficulty dressing/grooming: {ACTIONS;DENIES/REPORTS:21021675::"Denies"} Difficulty climbing stairs: {ACTIONS;DENIES/REPORTS:21021675::"Denies"} Difficulty getting out of chair: {ACTIONS;DENIES/REPORTS:21021675::"Denies"} Difficulty using hands for taps, buttons, cutlery, and/or writing: {ACTIONS;DENIES/REPORTS:21021675::"Denies"}  No Rheumatology ROS completed.   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 Independent in ADLs and IADLs. Works as a travel Public house manager.     There is no immunization history  on file for this patient.   Objective: Vital Signs: There were no vitals taken for this visit.   Physical Exam   Musculoskeletal Exam: ***  CDAI Exam: CDAI Score: -- Patient Global: --; Provider Global: -- Swollen: --; Tender: -- Joint Exam 09/27/2023   No joint exam has been documented for this visit   There is currently no information documented on the homunculus. Go to the Rheumatology activity and complete the homunculus joint exam.  Investigation: No additional findings.  Imaging: No results found.  Recent Labs: Lab Results  Component Value Date   WBC 14.3 (H) 05/24/2023   HGB 10.6 (L) 05/24/2023   PLT 279 05/24/2023   NA 137 05/24/2023   K 5.3 (H) 05/24/2023   CL 105 05/24/2023   CO2 24 05/24/2023   GLUCOSE 125 (H) 05/24/2023   BUN 27 (H) 05/24/2023   CREATININE 1.00 05/24/2023   BILITOT 0.7 05/22/2023   ALKPHOS 48 05/22/2023   AST 18 05/22/2023   ALT 13 05/22/2023   PROT 6.8 05/22/2023   ALBUMIN 3.4 (L) 05/22/2023   CALCIUM 8.8 (L) 05/24/2023   GFRAA >60 02/25/2020    Speciality Comments: No specialty comments available.  Procedures:  No procedures performed Allergies: Tape   Assessment / Plan:     Visit Diagnoses: No diagnosis found.  Orders: No orders of the defined types were placed in this encounter.  No orders of the defined types were placed in this encounter.   Face-to-face time spent with patient was *** minutes.  Greater than 50% of time was spent in counseling and coordination of care.  Follow-Up Instructions: No follow-ups on file.   Matt Song, MD  Note - This record has been created using [employer].  Chart creation errors have been sought, but may not always  have been located. Such creation errors do not reflect on  the standard of medical care.
